# Patient Record
Sex: Male | Born: 1935 | Race: White | Hispanic: No | Marital: Single | Smoking: Former smoker
Health system: Southern US, Community
[De-identification: ages and names within clinical notes are randomized; demographics above are authoritative.]

## PROBLEM LIST (undated history)

## (undated) DIAGNOSIS — Z950 Presence of cardiac pacemaker: Secondary | ICD-10-CM

## (undated) DIAGNOSIS — F32A Depression, unspecified: Secondary | ICD-10-CM

## (undated) DIAGNOSIS — M1990 Unspecified osteoarthritis, unspecified site: Secondary | ICD-10-CM

## (undated) DIAGNOSIS — E785 Hyperlipidemia, unspecified: Secondary | ICD-10-CM

## (undated) DIAGNOSIS — Z8679 Personal history of other diseases of the circulatory system: Secondary | ICD-10-CM

## (undated) DIAGNOSIS — K279 Peptic ulcer, site unspecified, unspecified as acute or chronic, without hemorrhage or perforation: Secondary | ICD-10-CM

## (undated) DIAGNOSIS — F329 Major depressive disorder, single episode, unspecified: Secondary | ICD-10-CM

## (undated) DIAGNOSIS — R079 Chest pain, unspecified: Secondary | ICD-10-CM

## (undated) DIAGNOSIS — I5189 Other ill-defined heart diseases: Secondary | ICD-10-CM

## (undated) DIAGNOSIS — K859 Acute pancreatitis without necrosis or infection, unspecified: Secondary | ICD-10-CM

## (undated) DIAGNOSIS — I10 Essential (primary) hypertension: Secondary | ICD-10-CM

## (undated) DIAGNOSIS — K219 Gastro-esophageal reflux disease without esophagitis: Secondary | ICD-10-CM

## (undated) DIAGNOSIS — Z8719 Personal history of other diseases of the digestive system: Secondary | ICD-10-CM

## (undated) DIAGNOSIS — N200 Calculus of kidney: Secondary | ICD-10-CM

## (undated) DIAGNOSIS — I509 Heart failure, unspecified: Secondary | ICD-10-CM

## (undated) DIAGNOSIS — H3530 Unspecified macular degeneration: Secondary | ICD-10-CM

## (undated) DIAGNOSIS — N400 Enlarged prostate without lower urinary tract symptoms: Secondary | ICD-10-CM

## (undated) HISTORY — DX: Essential (primary) hypertension: I10

## (undated) HISTORY — DX: Heart failure, unspecified: I50.9

## (undated) HISTORY — PX: TONSILLECTOMY: SUR1361

## (undated) HISTORY — DX: Hyperlipidemia, unspecified: E78.5

## (undated) HISTORY — DX: Personal history of other diseases of the circulatory system: Z86.79

## (undated) HISTORY — DX: Other ill-defined heart diseases: I51.89

## (undated) HISTORY — DX: Presence of cardiac pacemaker: Z95.0

## (undated) HISTORY — DX: Gastro-esophageal reflux disease without esophagitis: K21.9

## (undated) HISTORY — DX: Peptic ulcer, site unspecified, unspecified as acute or chronic, without hemorrhage or perforation: K27.9

## (undated) HISTORY — DX: Calculus of kidney: N20.0

## (undated) HISTORY — PX: TOTAL KNEE ARTHROPLASTY: SHX125

## (undated) HISTORY — DX: Personal history of other diseases of the digestive system: Z87.19

## (undated) HISTORY — PX: CHOLECYSTECTOMY: SHX55

## (undated) HISTORY — DX: Unspecified macular degeneration: H35.30

## (undated) HISTORY — DX: Benign prostatic hyperplasia without lower urinary tract symptoms: N40.0

## (undated) HISTORY — DX: Chest pain, unspecified: R07.9

## (undated) HISTORY — PX: HERNIA REPAIR: SHX51

## (undated) HISTORY — PX: PACEMAKER INSERTION: SHX728

## (undated) HISTORY — DX: Unspecified osteoarthritis, unspecified site: M19.90

---

## 1998-12-05 ENCOUNTER — Ambulatory Visit (HOSPITAL_COMMUNITY): Admission: RE | Admit: 1998-12-05 | Discharge: 1998-12-05 | Payer: Self-pay | Admitting: *Deleted

## 1999-01-31 ENCOUNTER — Encounter: Payer: Self-pay | Admitting: Orthopedic Surgery

## 1999-02-07 ENCOUNTER — Inpatient Hospital Stay (HOSPITAL_COMMUNITY): Admission: RE | Admit: 1999-02-07 | Discharge: 1999-02-14 | Payer: Self-pay | Admitting: Orthopedic Surgery

## 1999-02-11 ENCOUNTER — Encounter: Payer: Self-pay | Admitting: Orthopedic Surgery

## 1999-02-15 ENCOUNTER — Encounter: Admission: RE | Admit: 1999-02-15 | Discharge: 1999-05-16 | Payer: Self-pay | Admitting: Orthopedic Surgery

## 2000-05-04 ENCOUNTER — Observation Stay (HOSPITAL_COMMUNITY): Admission: EM | Admit: 2000-05-04 | Discharge: 2000-05-05 | Payer: Self-pay | Admitting: *Deleted

## 2000-05-04 ENCOUNTER — Encounter: Payer: Self-pay | Admitting: *Deleted

## 2000-05-18 ENCOUNTER — Inpatient Hospital Stay (HOSPITAL_COMMUNITY): Admission: EM | Admit: 2000-05-18 | Discharge: 2000-05-19 | Payer: Self-pay | Admitting: Emergency Medicine

## 2000-05-26 ENCOUNTER — Encounter: Payer: Self-pay | Admitting: Emergency Medicine

## 2000-05-26 ENCOUNTER — Emergency Department (HOSPITAL_COMMUNITY): Admission: EM | Admit: 2000-05-26 | Discharge: 2000-05-26 | Payer: Self-pay | Admitting: Emergency Medicine

## 2000-09-05 ENCOUNTER — Ambulatory Visit (HOSPITAL_BASED_OUTPATIENT_CLINIC_OR_DEPARTMENT_OTHER): Admission: RE | Admit: 2000-09-05 | Discharge: 2000-09-06 | Payer: Self-pay | Admitting: *Deleted

## 2001-05-04 ENCOUNTER — Encounter: Payer: Self-pay | Admitting: Orthopedic Surgery

## 2001-05-04 ENCOUNTER — Ambulatory Visit (HOSPITAL_COMMUNITY): Admission: RE | Admit: 2001-05-04 | Discharge: 2001-05-04 | Payer: Self-pay | Admitting: Orthopedic Surgery

## 2001-11-04 ENCOUNTER — Ambulatory Visit (HOSPITAL_COMMUNITY): Admission: RE | Admit: 2001-11-04 | Discharge: 2001-11-04 | Payer: Self-pay | Admitting: Ophthalmology

## 2001-12-24 ENCOUNTER — Inpatient Hospital Stay (HOSPITAL_COMMUNITY): Admission: RE | Admit: 2001-12-24 | Discharge: 2001-12-26 | Payer: Self-pay | Admitting: Urology

## 2001-12-24 ENCOUNTER — Encounter (INDEPENDENT_AMBULATORY_CARE_PROVIDER_SITE_OTHER): Payer: Self-pay | Admitting: Specialist

## 2003-04-17 ENCOUNTER — Encounter: Payer: Self-pay | Admitting: Emergency Medicine

## 2003-04-17 ENCOUNTER — Emergency Department (HOSPITAL_COMMUNITY): Admission: EM | Admit: 2003-04-17 | Discharge: 2003-04-17 | Payer: Self-pay | Admitting: Emergency Medicine

## 2003-09-13 ENCOUNTER — Encounter: Admission: RE | Admit: 2003-09-13 | Discharge: 2003-09-29 | Payer: Self-pay | Admitting: Orthopedic Surgery

## 2003-10-12 ENCOUNTER — Ambulatory Visit (HOSPITAL_COMMUNITY): Admission: RE | Admit: 2003-10-12 | Discharge: 2003-10-12 | Payer: Self-pay | Admitting: *Deleted

## 2003-10-16 ENCOUNTER — Emergency Department (HOSPITAL_COMMUNITY): Admission: EM | Admit: 2003-10-16 | Discharge: 2003-10-16 | Payer: Self-pay | Admitting: Emergency Medicine

## 2003-10-26 ENCOUNTER — Ambulatory Visit (HOSPITAL_COMMUNITY): Admission: RE | Admit: 2003-10-26 | Discharge: 2003-10-26 | Payer: Self-pay | Admitting: Internal Medicine

## 2004-03-20 ENCOUNTER — Ambulatory Visit (HOSPITAL_COMMUNITY): Admission: RE | Admit: 2004-03-20 | Discharge: 2004-03-20 | Payer: Self-pay | Admitting: Gastroenterology

## 2004-03-20 ENCOUNTER — Encounter (INDEPENDENT_AMBULATORY_CARE_PROVIDER_SITE_OTHER): Payer: Self-pay | Admitting: *Deleted

## 2004-10-04 ENCOUNTER — Ambulatory Visit: Payer: Self-pay | Admitting: Cardiology

## 2004-10-16 ENCOUNTER — Ambulatory Visit: Payer: Self-pay | Admitting: Cardiology

## 2004-11-05 ENCOUNTER — Ambulatory Visit (HOSPITAL_COMMUNITY): Admission: RE | Admit: 2004-11-05 | Discharge: 2004-11-05 | Payer: Self-pay | Admitting: Internal Medicine

## 2005-04-17 ENCOUNTER — Ambulatory Visit: Payer: Self-pay | Admitting: Cardiology

## 2005-11-09 ENCOUNTER — Ambulatory Visit: Payer: Self-pay | Admitting: Cardiology

## 2005-11-09 ENCOUNTER — Inpatient Hospital Stay (HOSPITAL_COMMUNITY): Admission: EM | Admit: 2005-11-09 | Discharge: 2005-11-12 | Payer: Self-pay | Admitting: Emergency Medicine

## 2005-11-11 ENCOUNTER — Encounter: Payer: Self-pay | Admitting: Cardiology

## 2005-11-13 ENCOUNTER — Ambulatory Visit: Payer: Self-pay | Admitting: Internal Medicine

## 2005-11-26 ENCOUNTER — Ambulatory Visit: Payer: Self-pay | Admitting: Cardiology

## 2005-12-04 ENCOUNTER — Ambulatory Visit: Payer: Self-pay | Admitting: Internal Medicine

## 2005-12-19 ENCOUNTER — Ambulatory Visit: Payer: Self-pay | Admitting: Internal Medicine

## 2005-12-27 ENCOUNTER — Ambulatory Visit: Payer: Self-pay | Admitting: Internal Medicine

## 2005-12-27 ENCOUNTER — Ambulatory Visit (HOSPITAL_COMMUNITY): Admission: RE | Admit: 2005-12-27 | Discharge: 2005-12-27 | Payer: Self-pay | Admitting: Internal Medicine

## 2006-01-07 ENCOUNTER — Ambulatory Visit: Payer: Self-pay | Admitting: Cardiology

## 2006-01-10 ENCOUNTER — Ambulatory Visit: Payer: Self-pay

## 2006-07-04 ENCOUNTER — Ambulatory Visit: Payer: Self-pay | Admitting: Cardiology

## 2006-07-10 ENCOUNTER — Ambulatory Visit: Payer: Self-pay

## 2007-01-13 ENCOUNTER — Ambulatory Visit: Payer: Self-pay

## 2007-01-22 ENCOUNTER — Emergency Department (HOSPITAL_COMMUNITY): Admission: EM | Admit: 2007-01-22 | Discharge: 2007-01-22 | Payer: Self-pay | Admitting: Emergency Medicine

## 2007-02-03 ENCOUNTER — Ambulatory Visit: Payer: Self-pay | Admitting: Cardiology

## 2007-02-03 LAB — CONVERTED CEMR LAB
Cholesterol: 161 mg/dL (ref 0–200)
HDL: 46.2 mg/dL (ref >39.0)
VLDL: 24 mg/dL (ref 0–40)

## 2007-02-05 ENCOUNTER — Ambulatory Visit: Payer: Self-pay

## 2007-06-09 ENCOUNTER — Ambulatory Visit: Payer: Self-pay | Admitting: Cardiology

## 2007-06-09 LAB — CONVERTED CEMR LAB
BUN: 24 mg/dL — ABNORMAL HIGH (ref 6–23)
Calcium: 9.5 mg/dL (ref 8.4–10.5)
Chloride: 98 meq/L (ref 96–112)
Creatinine, Ser: 1.5 mg/dL (ref 0.4–1.5)
Sodium: 139 meq/L (ref 135–145)

## 2007-07-01 ENCOUNTER — Ambulatory Visit: Payer: Self-pay

## 2007-07-01 ENCOUNTER — Ambulatory Visit: Payer: Self-pay | Admitting: Internal Medicine

## 2007-07-22 ENCOUNTER — Ambulatory Visit: Payer: Self-pay | Admitting: Cardiology

## 2007-08-16 ENCOUNTER — Inpatient Hospital Stay (HOSPITAL_COMMUNITY): Admission: EM | Admit: 2007-08-16 | Discharge: 2007-08-20 | Payer: Self-pay | Admitting: Emergency Medicine

## 2007-12-30 ENCOUNTER — Ambulatory Visit: Payer: Self-pay

## 2007-12-30 ENCOUNTER — Ambulatory Visit: Payer: Self-pay | Admitting: Cardiology

## 2008-02-09 ENCOUNTER — Ambulatory Visit: Payer: Self-pay | Admitting: Cardiology

## 2008-02-09 LAB — CONVERTED CEMR LAB
AST: 17 units/L (ref 0–37)
Alkaline Phosphatase: 55 units/L (ref 39–117)
Bilirubin, Direct: 0.1 mg/dL (ref 0.0–0.3)
CO2: 32 meq/L (ref 19–32)
Chloride: 104 meq/L (ref 96–112)
Creatinine, Ser: 1.4 mg/dL (ref 0.4–1.5)
GFR calc non Af Amer: 52 mL/min
LDL Cholesterol: 62 mg/dL (ref 0–99)
Potassium: 4 meq/L (ref 3.5–5.1)
Sodium: 143 meq/L (ref 135–145)
Total Bilirubin: 1.1 mg/dL (ref 0.3–1.2)
Total CHOL/HDL Ratio: 2.6
Triglycerides: 61 mg/dL (ref 0–149)

## 2008-03-29 ENCOUNTER — Ambulatory Visit: Payer: Self-pay | Admitting: Internal Medicine

## 2008-05-18 ENCOUNTER — Encounter: Admission: RE | Admit: 2008-05-18 | Discharge: 2008-05-18 | Payer: Self-pay | Admitting: Gastroenterology

## 2008-06-29 ENCOUNTER — Ambulatory Visit: Payer: Self-pay | Admitting: Internal Medicine

## 2008-07-07 ENCOUNTER — Ambulatory Visit: Payer: Self-pay

## 2008-09-28 ENCOUNTER — Ambulatory Visit: Payer: Self-pay | Admitting: Internal Medicine

## 2008-10-20 ENCOUNTER — Encounter (INDEPENDENT_AMBULATORY_CARE_PROVIDER_SITE_OTHER): Payer: Self-pay | Admitting: *Deleted

## 2009-01-06 ENCOUNTER — Encounter: Payer: Self-pay | Admitting: Internal Medicine

## 2009-01-11 ENCOUNTER — Ambulatory Visit: Payer: Self-pay

## 2009-01-11 ENCOUNTER — Encounter: Payer: Self-pay | Admitting: Cardiology

## 2009-01-11 DIAGNOSIS — I6529 Occlusion and stenosis of unspecified carotid artery: Secondary | ICD-10-CM | POA: Insufficient documentation

## 2009-01-12 ENCOUNTER — Telehealth: Payer: Self-pay | Admitting: Internal Medicine

## 2009-01-13 ENCOUNTER — Encounter: Payer: Self-pay | Admitting: Internal Medicine

## 2009-01-16 ENCOUNTER — Ambulatory Visit: Payer: Self-pay | Admitting: Internal Medicine

## 2009-01-18 ENCOUNTER — Encounter: Payer: Self-pay | Admitting: Internal Medicine

## 2009-04-11 ENCOUNTER — Ambulatory Visit: Payer: Self-pay | Admitting: Internal Medicine

## 2009-04-11 DIAGNOSIS — I5033 Acute on chronic diastolic (congestive) heart failure: Secondary | ICD-10-CM

## 2009-04-14 ENCOUNTER — Ambulatory Visit: Payer: Self-pay

## 2009-04-14 ENCOUNTER — Encounter: Payer: Self-pay | Admitting: Internal Medicine

## 2009-07-07 ENCOUNTER — Encounter: Payer: Self-pay | Admitting: Cardiovascular Disease

## 2009-07-07 ENCOUNTER — Encounter: Payer: Self-pay | Admitting: Cardiology

## 2009-07-07 ENCOUNTER — Ambulatory Visit: Payer: Self-pay

## 2009-07-14 ENCOUNTER — Encounter: Payer: Self-pay | Admitting: Internal Medicine

## 2009-07-21 ENCOUNTER — Ambulatory Visit: Payer: Self-pay | Admitting: Internal Medicine

## 2009-07-28 ENCOUNTER — Ambulatory Visit: Payer: Self-pay | Admitting: Cardiology

## 2009-07-28 DIAGNOSIS — I2510 Atherosclerotic heart disease of native coronary artery without angina pectoris: Secondary | ICD-10-CM | POA: Insufficient documentation

## 2009-07-28 DIAGNOSIS — E785 Hyperlipidemia, unspecified: Secondary | ICD-10-CM

## 2009-07-28 DIAGNOSIS — I10 Essential (primary) hypertension: Secondary | ICD-10-CM

## 2009-07-28 DIAGNOSIS — R0602 Shortness of breath: Secondary | ICD-10-CM

## 2009-08-04 ENCOUNTER — Ambulatory Visit: Payer: Self-pay | Admitting: Cardiology

## 2009-08-04 ENCOUNTER — Encounter: Payer: Self-pay | Admitting: Cardiology

## 2009-08-04 ENCOUNTER — Ambulatory Visit: Payer: Self-pay

## 2009-08-04 ENCOUNTER — Ambulatory Visit: Payer: Self-pay | Admitting: Cardiovascular Disease

## 2009-08-04 ENCOUNTER — Ambulatory Visit (HOSPITAL_COMMUNITY): Admission: RE | Admit: 2009-08-04 | Discharge: 2009-08-04 | Payer: Self-pay | Admitting: Cardiology

## 2009-08-07 LAB — CONVERTED CEMR LAB
Calcium: 8.8 mg/dL (ref 8.4–10.5)
Glucose, Bld: 80 mg/dL (ref 70–99)

## 2009-08-11 ENCOUNTER — Ambulatory Visit: Payer: Self-pay | Admitting: Cardiology

## 2009-08-11 DIAGNOSIS — N289 Disorder of kidney and ureter, unspecified: Secondary | ICD-10-CM | POA: Insufficient documentation

## 2009-08-14 LAB — CONVERTED CEMR LAB
BUN: 37 mg/dL — ABNORMAL HIGH (ref 6–23)
CO2: 34 meq/L — ABNORMAL HIGH (ref 19–32)
Chloride: 100 meq/L (ref 96–112)
Creatinine, Ser: 2 mg/dL — ABNORMAL HIGH (ref 0.4–1.5)
Glucose, Bld: 80 mg/dL (ref 70–99)
Potassium: 5.4 meq/L — ABNORMAL HIGH (ref 3.5–5.1)

## 2009-08-18 ENCOUNTER — Ambulatory Visit: Payer: Self-pay | Admitting: Cardiology

## 2009-08-18 DIAGNOSIS — E876 Hypokalemia: Secondary | ICD-10-CM | POA: Insufficient documentation

## 2009-08-21 LAB — CONVERTED CEMR LAB
BUN: 39 mg/dL — ABNORMAL HIGH (ref 6–23)
CO2: 33 meq/L — ABNORMAL HIGH (ref 19–32)
Chloride: 102 meq/L (ref 96–112)
Creatinine, Ser: 2.1 mg/dL — ABNORMAL HIGH (ref 0.4–1.5)
Glucose, Bld: 88 mg/dL (ref 70–99)
Sodium: 144 meq/L (ref 135–145)

## 2009-08-25 ENCOUNTER — Encounter: Payer: Self-pay | Admitting: Internal Medicine

## 2009-08-25 ENCOUNTER — Ambulatory Visit: Payer: Self-pay | Admitting: Cardiology

## 2009-08-25 LAB — CONVERTED CEMR LAB
BUN: 32 mg/dL — ABNORMAL HIGH (ref 6–23)
CO2: 36 meq/L — ABNORMAL HIGH (ref 19–32)
Chloride: 104 meq/L (ref 96–112)
Glucose, Bld: 80 mg/dL (ref 70–99)
Potassium: 5 meq/L (ref 3.5–5.1)
Sodium: 143 meq/L (ref 135–145)

## 2009-09-07 ENCOUNTER — Encounter: Payer: Self-pay | Admitting: Cardiovascular Disease

## 2009-09-27 ENCOUNTER — Ambulatory Visit: Payer: Self-pay | Admitting: Cardiology

## 2009-10-06 ENCOUNTER — Ambulatory Visit: Payer: Self-pay | Admitting: Internal Medicine

## 2009-10-09 ENCOUNTER — Encounter: Payer: Self-pay | Admitting: Cardiology

## 2009-10-25 ENCOUNTER — Ambulatory Visit: Payer: Self-pay | Admitting: Internal Medicine

## 2009-10-27 ENCOUNTER — Encounter: Payer: Self-pay | Admitting: Internal Medicine

## 2010-01-24 ENCOUNTER — Telehealth: Payer: Self-pay | Admitting: Cardiology

## 2010-01-24 ENCOUNTER — Encounter: Payer: Self-pay | Admitting: Cardiology

## 2010-01-25 ENCOUNTER — Ambulatory Visit: Payer: Self-pay

## 2010-01-25 ENCOUNTER — Encounter: Payer: Self-pay | Admitting: Cardiology

## 2010-01-25 ENCOUNTER — Ambulatory Visit: Payer: Self-pay | Admitting: Internal Medicine

## 2010-02-16 ENCOUNTER — Encounter: Payer: Self-pay | Admitting: Internal Medicine

## 2010-03-06 ENCOUNTER — Ambulatory Visit: Payer: Self-pay | Admitting: Cardiology

## 2010-04-24 ENCOUNTER — Ambulatory Visit: Payer: Self-pay | Admitting: Internal Medicine

## 2010-07-22 ENCOUNTER — Encounter: Payer: Self-pay | Admitting: Gastroenterology

## 2010-07-25 ENCOUNTER — Encounter: Payer: Self-pay | Admitting: Cardiology

## 2010-07-26 ENCOUNTER — Encounter: Payer: Self-pay | Admitting: Internal Medicine

## 2010-07-26 ENCOUNTER — Ambulatory Visit: Admission: RE | Admit: 2010-07-26 | Discharge: 2010-07-26 | Payer: Self-pay | Source: Home / Self Care

## 2010-07-26 ENCOUNTER — Ambulatory Visit
Admission: RE | Admit: 2010-07-26 | Discharge: 2010-07-26 | Payer: Self-pay | Source: Home / Self Care | Attending: Internal Medicine | Admitting: Internal Medicine

## 2010-07-26 ENCOUNTER — Encounter: Payer: Self-pay | Admitting: Cardiology

## 2010-07-31 NOTE — Cardiovascular Report (Addendum)
Summary: Office Visit Remote   Office Visit Remote   Imported By: Roderic Ovens 08/25/2009 13:53:18  _____________________________________________________________________  External Attachment:    Type:   Image     Comment:   External Document

## 2010-07-31 NOTE — Miscellaneous (Addendum)
Summary: Orders Update  Clinical Lists Changes  Orders: Added new Test order of Carotid Duplex (Carotid Duplex) - Signed 

## 2010-07-31 NOTE — Letter (Addendum)
Summary: Device-Delinquent Phone Journalist, newspaper, Main Office  [address]   [place], [place] [address]   Phone: [phone]  Fax: [phone]     July 14, 2009 MRN: [id number]   [name] [name] [address] [place], [place]  [address]   Dear Mr. Folker,  According to our records, you were scheduled for a device phone transmission on July 12, 2009.     We did not receive any results from this check.  If you transmitted on your scheduled day, please call us to help troubleshoot your system.  If you forgot to send your transmission, please send one upon receipt of this letter.  Thank you,   [facility]

## 2010-07-31 NOTE — Cardiovascular Report (Addendum)
Summary: Office Visit Remote   Office Visit Remote   Imported By: Roderic Ovens 02/21/2010 14:57:37  _____________________________________________________________________  External Attachment:    Type:   Image     Comment:   External Document

## 2010-07-31 NOTE — Miscellaneous (Addendum)
Summary: Orders Update pft charges  Clinical Lists Changes  Orders: Added new Service order of Carbon Monoxide diffusing w/capacity (94720) - Signed Added new Service order of Lung Volumes (94240) - Signed Added new Service order of Spirometry (Pre & Post) (94060) - Signed 

## 2010-07-31 NOTE — Letter (Addendum)
Summary: [facility] Office Note  [facility] Office Note   Imported By: Roderic Ovens 09/29/2009 10:30:50  _____________________________________________________________________  External Attachment:    Type:   Image     Comment:   External Document

## 2010-07-31 NOTE — Progress Notes (Addendum)
Summary: pt needs refill   Phone Note Refill Request Call back at [phone] Message from:  Patient on [facility]/phone# [phone]    Refills Requested: Medication #1:  METOPROLOL TARTRATE 25 MG TABS Take one tablet by mouth twice a day Initial call taken by: Omer Jack,  January 24, 2010 10:57 AM    Prescriptions: METOPROLOL TARTRATE 25 MG TABS (METOPROLOL TARTRATE) Take one tablet by mouth twice a day  #60 x 12   Entered by:   Kem Parkinson   Authorized by:   Ferman Hamming, MD, [facility]   Signed by:   Kem Parkinson on 01/24/2010   Method used:   Faxed to ...       [facility] (retail)       [address]       [place], [place]  [address]       Ph: [phone] or [phone]       Fax: [phone]   RxID:   [id number]

## 2010-07-31 NOTE — Cardiovascular Report (Addendum)
Summary: Office Visit Remote  Office Visit Remote   Imported By: Roderic Ovens 11/03/2009 14:23:00  _____________________________________________________________________  External Attachment:    Type:   Image     Comment:   External Document

## 2010-07-31 NOTE — Assessment & Plan Note (Addendum)
Summary: per check out/sf  Medications Added PANTOPRAZOLE SODIUM 40 MG TBEC (PANTOPRAZOLE SODIUM) 1 tab by mouth two times a day FUROSEMIDE 40 MG TABS (FUROSEMIDE) Take one tablet by mouth daily. KLOR-CON M20 20 MEQ CR-TABS (POTASSIUM CHLORIDE CRYS CR) 2 tabs by mouth once daily VITAMIN B-12 100 MCG TABS (CYANOCOBALAMIN) 1 tab by mouth once daily ASPIRIN 81 MG  TABS (ASPIRIN) 1 tab by mouth once daily        CC:  sob.  History of Present Illness: [name] [name] is a very pleasant gentleman who has a history of congestive heart failure, felt secondary to diastolic dysfunction; hypertension; hyperlipidemia; mild coronary artery disease; and cerebrovascular disease.  He also has had a previous pacemaker placed in December 2008 following a cardiac arrest at [facility].  This was placed at [facility] and apparently was bradycardia mediated.  His most recent cardiac catheterization here was in April 2005.  At that time, he had nonobstructive coronary artery disease, but he did have elevated right and left filling pressures. Last Myoview was performed on February 05, 2007. Ejection fraction was 65% and the perfusion was normal. His most recent echocardiogram here in May 2007 showed normal LV function, mild aortic insufficiency, and mild mitral regurgitation.  He did have dilated right ventricle.  Carotid Dopplers in January of 2011 showed 60-79% left and 0-39% right stenosis. Followup is recommended in one year. I last saw him in July of 2009. Since then he has noticed increasing dyspnea on exertion over the past 3 months. It is relieved with rest. It is not associated with chest pain. There is no orthopnea or PND and he states his pedal edema is reasonably controlled with his Lasix. He otherwise does not have chest pain and is no syncope.  Current Medications (verified): 1)  Metoprolol Tartrate 25 Mg Tabs (Metoprolol Tartrate) .... Take One Tablet By Mouth Twice A Day 2)  Simvastatin 40 Mg Tabs  (Simvastatin) .... Take One Tablet By Mouth Daily At Bedtime 3)  Finasteride 5 Mg Tabs (Finasteride) .... Take One Tablet Once Daily 4)  Flunisolide 0.025 % Soln (Flunisolide) .... Use 2 Sprays in Each Nostril Two Times A Day 5)  Lisinopril 40 Mg Tabs (Lisinopril) .... Take One Half Tablet Once Daily 6)  Ocuvite  Tabs (Multiple Vitamins-Minerals) .... 2 Tabs Two Times A Day 7)  Salsalate 500 Mg Tabs (Salsalate) .... Take One Tablet By Mouth Two Times A Day 8)  Citalopram Hydrobromide 20 Mg Tabs (Citalopram Hydrobromide) .... Take One Tablet Once Daily 9)  Pantoprazole Sodium 40 Mg Tbec (Pantoprazole Sodium) .Marland Kitchen.. 1 Tab By Mouth Two Times A Day 10)  Allopurinol 300 Mg Tabs (Allopurinol) .... Take One Tablet Once Daily 11)  Furosemide 20 Mg Tabs (Furosemide) .... Once Daily 12)  Polyethylene Glycol 3350  Liqd (Polyethylene Glycol 3350) .... Take Once Daily 13)  Klor-Con M20 20 Meq Cr-Tabs (Potassium Chloride Crys Cr) .... 2 Tabs By Mouth Once Daily 14)  Vitamin B-12 100 Mcg Tabs (Cyanocobalamin) .Marland Kitchen.. 1 Tab By Mouth Once Daily 15)  Aspirin 81 Mg  Tabs (Aspirin) .Marland Kitchen.. 1 Tab By Mouth Once Daily  Allergies: No Known Drug Allergies  Past History:  Past Medical History: Osteoarthritis Gout Hypertension History of coronary disease GERD Cardiomyopathy with congestive heart failure and diastolic dysfunction History of GI bleed History of pacemaker-Medtronic Adapta ADDro1 Kidney stone Peptic ulcer disease BPH Macular degeneration of the right eye, near blind Hyperlipidemia History of cerebrovascular disease     Past Surgical  History: Pacemaker-implantation Medtronic Adapta ADDro1 History of cholecystectomy History of knee replacement  Social History: Reviewed history from 04/10/2009 and no changes required. No alcohol.  He is married for 20 years.  He has 2   children and 3 grandchildren.  No tobacco use.  He is retired.   Review of Systems       Patient complains of increased  thirst but no fevers or chills, productive cough, hemoptysis, dysphasia, odynophagia, melena, hematochezia, dysuria, hematuria, rash, seizure activity, orthopnea, PND,  claudication. Remaining systems are negative.   Vital Signs:  Patient profile:   77 year old male Height:      68 inches Weight:      198 pounds BMI:     30.21 Pulse rate:   91 / minute Resp:     12 per minute BP sitting:   109 / 71  (left arm)  Vitals Entered By: Kem Parkinson (July 28, 2009 9:40 AM)  Physical Exam  General:  Well-developed well-nourished in no acute distress.  Skin is warm and dry.  HEENT is normal.  Neck is supple. No thyromegaly.  Chest diminished breath sounds throughout but otherwise clear. Cardiovascular exam is regular rate and rhythm.  Abdominal exam nontender or distended. No masses palpated. Extremities show trace edema. neuro grossly intact    EKG  Procedure date:  07/28/2009  Findings:      atrial paced rhythm with first degree AV block. IVCD. Cannot rule out prior inferior infarct.  PPM Specifications Following MD:  Sherryl Manges, MD     PPM Vendor:  Medtronic     PPM Model Number:  ADDR01     PPM Serial Number:  [id number] PPM DOI:  04/07/2007      Lead 1    Location: RA     DOI: 04/07/2007     Model #: 5076     Serial #: [id number]     Status: active Lead 2    Location: RV     DOI: 04/07/2007     Model #: 6578     Serial #: [id number]     Status: active   Indications:  SYNCOPE   PPM Follow Up Pacer Dependent:  No      Episodes Coumadin:  No  Parameters Mode:  DDDR+     Lower Rate Limit:  60     Upper Rate Limit:  130 Paced AV Delay:  200     Sensed AV Delay:  180  Impression & Recommendations:  Problem # 1:  DYSPNEA (ICD-786.05) This may be related to diastolic dysfunction. I will increase his Lasix to 40 mg p.o. daily. I will check a bmet and BNP in one week. I will schedule an echocardiogram to reassess LV function. His updated medication list for this  problem includes:    Metoprolol Tartrate 25 Mg Tabs (Metoprolol tartrate) .Marland Kitchen... Take one tablet by mouth twice a day    Lisinopril 40 Mg Tabs (Lisinopril) .Marland Kitchen... Take one half tablet once daily    Salsalate 500 Mg Tabs (Salsalate) .Marland Kitchen... Take one tablet by mouth two times a day    Furosemide 20 Mg Tabs (Furosemide) ..... Once daily    Aspirin 81 Mg Tabs (Aspirin) .Marland Kitchen... 1 tab by mouth once daily  Orders: Echocardiogram (Echo)  Problem # 2:  DIASTOLIC HEART FAILURE, CHRONIC (ICD-428.32)  Management as per #1. His updated medication list for this problem includes:    Metoprolol Tartrate 25 Mg Tabs (Metoprolol tartrate) .Marland Kitchen... Take one tablet by  mouth twice a day    Lisinopril 40 Mg Tabs (Lisinopril) .Marland Kitchen... Take one half tablet once daily    Salsalate 500 Mg Tabs (Salsalate) .Marland Kitchen... Take one tablet by mouth two times a day    Furosemide 40 Mg Tabs (Furosemide) .Marland Kitchen... Take one tablet by mouth daily.    Aspirin 81 Mg Tabs (Aspirin) .Marland Kitchen... 1 tab by mouth once daily  His updated medication list for this problem includes:    Metoprolol Tartrate 25 Mg Tabs (Metoprolol tartrate) .Marland Kitchen... Take one tablet by mouth twice a day    Lisinopril 40 Mg Tabs (Lisinopril) .Marland Kitchen... Take one half tablet once daily    Salsalate 500 Mg Tabs (Salsalate) .Marland Kitchen... Take one tablet by mouth two times a day    Furosemide 40 Mg Tabs (Furosemide) .Marland Kitchen... Take one tablet by mouth daily.    Aspirin 81 Mg Tabs (Aspirin) .Marland Kitchen... 1 tab by mouth once daily  Problem # 3:  CARDIAC PACEMAKER MEDTRONIC ADAPTA ADDRO1 (ICD-V45.01) Management per EP.  Problem # 4:  CAROTID OCCLUSIVE DISEASE (ICD-433.10) Continue aspirin and statin. Followup carotid Dopplers January of 2012. His updated medication list for this problem includes:    Salsalate 500 Mg Tabs (Salsalate) .Marland Kitchen... Take one tablet by mouth two times a day    Aspirin 81 Mg Tabs (Aspirin) .Marland Kitchen... 1 tab by mouth once daily  His updated medication list for this problem includes:    Salsalate 500  Mg Tabs (Salsalate) .Marland Kitchen... Take one tablet by mouth two times a day    Aspirin 81 Mg Tabs (Aspirin) .Marland Kitchen... 1 tab by mouth once daily  Problem # 5:  ESSENTIAL HYPERTENSION, BENIGN (ICD-401.1) Blood pressure controlled on present medications. Will continue. His updated medication list for this problem includes:    Metoprolol Tartrate 25 Mg Tabs (Metoprolol tartrate) .Marland Kitchen... Take one tablet by mouth twice a day    Lisinopril 40 Mg Tabs (Lisinopril) .Marland Kitchen... Take one half tablet once daily    Salsalate 500 Mg Tabs (Salsalate) .Marland Kitchen... Take one tablet by mouth two times a day    Furosemide 40 Mg Tabs (Furosemide) .Marland Kitchen... Take one tablet by mouth daily.    Aspirin 81 Mg Tabs (Aspirin) .Marland Kitchen... 1 tab by mouth once daily  Problem # 6:  HYPERLIPIDEMIA (ICD-272.4) Continue statin. Lipids and liver monitored by his primary care. His updated medication list for this problem includes:    Simvastatin 40 Mg Tabs (Simvastatin) .Marland Kitchen... Take one tablet by mouth daily at bedtime  Problem # 7:  CAD (ICD-414.00) Continue aspirin, beta blocker ACE inhibitor and statin. His updated medication list for this problem includes:    Metoprolol Tartrate 25 Mg Tabs (Metoprolol tartrate) .Marland Kitchen... Take one tablet by mouth twice a day    Lisinopril 40 Mg Tabs (Lisinopril) .Marland Kitchen... Take one half tablet once daily    Salsalate 500 Mg Tabs (Salsalate) .Marland Kitchen... Take one tablet by mouth two times a day    Aspirin 81 Mg Tabs (Aspirin) .Marland Kitchen... 1 tab by mouth once daily  Patient Instructions: 1)  Your physician recommends that you schedule a follow-up appointment in: 8 WEEKS 2)  Your physician recommends that you return for lab work in:ONE WEEK-BMP/BNP-428.33/V58.69 3)  Your physician has recommended you make the following change in your medication: INCREASE FUROSEMIDE TO 40MG  ONCE DAILY 4)  Your physician has requested that you have an echocardiogram.  Echocardiography is a painless test that uses sound waves to create images of your heart. It provides  your doctor with information about the  size and shape of your heart and how well your heart's chambers and valves are working.  This procedure takes approximately one hour. There are no restrictions for this procedure. Prescriptions: FUROSEMIDE 40 MG TABS (FUROSEMIDE) Take one tablet by mouth daily.  #30 x 12   Entered by:   Deliah Goody, RN   Authorized by:   Ferman Hamming, MD, [facility]   Signed by:   Deliah Goody, RN on 07/28/2009   Method used:   Electronically to        [facility] [phone]* (retail)       [address] [place], [place]  [address]       Ph: [phone] or [phone]       Fax: [phone]   RxID:   [id number]   Appended Document: per check out/sf Patient states that his blood pressure runs low at times. Given that we are increasing his Lasix I will decrease his lisinopril to 20 mg p.o. daily.  Appended Document: per check out/sf pt states in the past bp running low on increased lasix, per dr Jens Som pt to decrease lisinopril to 10mg  once daily Deliah Goody, RN  July 28, 2009 10:06 AM    Clinical Lists Changes  Medications: Changed medication from LISINOPRIL 40 MG TABS (LISINOPRIL) take one half tablet once daily to LISINOPRIL 10 MG TABS (LISINOPRIL) Take one tablet by mouth daily - Signed Rx of LISINOPRIL 10 MG TABS (LISINOPRIL) Take one tablet by mouth daily;  #30 x 12;  Signed;  Entered by: Deliah Goody, RN;  Authorized by: Ferman Hamming, MD, [facility];  Method used: Print then Give to Patient Rx of LISINOPRIL 10 MG TABS (LISINOPRIL) Take one tablet by mouth daily;  #30 x 12;  Signed;  Entered by: Deliah Goody, RN;  Authorized by: Ferman Hamming, MD, [facility];  Method used: Electronically to [facility] [phone]*, [address], [place], [place], [place]  [address], Ph: [phone] or [phone], Fax: [phone]    Prescriptions: LISINOPRIL 10 MG TABS (LISINOPRIL) Take one tablet by mouth  daily  #30 x 12   Entered by:   Deliah Goody, RN   Authorized by:   Ferman Hamming, MD, [facility]   Signed by:   Deliah Goody, RN on 07/28/2009   Method used:   Electronically to        [facility] [phone]* (retail)       [address] [place], [place]  [address]       Ph: [phone] or [phone]       Fax: [phone]   RxID:   [id number] LISINOPRIL 10 MG TABS (LISINOPRIL) Take one tablet by mouth daily  #30 x 12   Entered by:   Deliah Goody, RN   Authorized by:   Ferman Hamming, MD, [facility]   Signed by:   Deliah Goody, RN on 07/28/2009   Method used:   Print then Give to Patient   RxID:   [id number]

## 2010-07-31 NOTE — Assessment & Plan Note (Addendum)
Summary: 6 month rov/sl      Allergies Added: NKDA  Visit Type:  6  months follow up Primary [name] [name]:  [name] [name]  CC:  No complains.  History of Present Illness: [name] [name] is a very pleasant gentleman who has a history of congestive heart failure, felt secondary to diastolic dysfunction; hypertension; hyperlipidemia; mild coronary artery disease; and cerebrovascular disease.  He also has had a previous pacemaker placed in December 2008 following a cardiac arrest at [facility].  This was placed at [facility] and apparently was bradycardia mediated.  His most recent cardiac catheterization here was in April 2005.  At that time, he had nonobstructive coronary artery disease, but he did have elevated right and left filling pressures. Last Myoview was performed on February 05, 2007. Ejection fraction was 65% and the perfusion was normal. His most recent echocardiogram  was performed in February of 2011 and showed normal LV function, mild aortic stenosis with a mean gradient of 9 mmHg, mild mitral and tricuspid regurgitation and mild biatrial enlargement.  Carotid Dopplers in July of 2011 showed 60-79% left and 0-39% right stenosis. Followup is recommended in six months. I last saw him in March of 2011. Since then, the patient has dyspnea with more extreme activities but not with routine activities. It is relieved with rest. It is not associated with chest pain. There is no orthopnea, PND. There is no syncope or palpitations. There is no exertional chest pain. He does have mild pedal edema.   Current Medications (verified): 1)  Metoprolol Tartrate 25 Mg Tabs (Metoprolol Tartrate) .... Take One Tablet By Mouth Twice A Day 2)  Simvastatin 40 Mg Tabs (Simvastatin) .... Take One Tablet By Mouth Daily At Bedtime 3)  Finasteride 5 Mg Tabs (Finasteride) .... Take One Tablet Once Daily 4)  Flunisolide 0.025 % Soln (Flunisolide) .... Use 2 Sprays in Each Nostril Two Times A Day 5)  Ocuvite   Tabs (Multiple Vitamins-Minerals) .... 2 Tabs Two Times A Day 6)  Citalopram Hydrobromide 20 Mg Tabs (Citalopram Hydrobromide) .... Take One Tablet Once Daily 7)  Pantoprazole Sodium 40 Mg Tbec (Pantoprazole Sodium) .Marland Kitchen.. 1 Tab By Mouth Two Times A Day 8)  Allopurinol 300 Mg Tabs (Allopurinol) .... Take One Tablet Once Daily 9)  Furosemide 40 Mg Tabs (Furosemide) .... Take One Tablet Alt With One Half Tablet Everyother Day 10)  Polyethylene Glycol 3350  Liqd (Polyethylene Glycol 3350) .... Take Once Daily 11)  Vitamin B-12 100 Mcg Tabs (Cyanocobalamin) .Marland Kitchen.. 1 Tab By Mouth Once Daily 12)  Aspirin 81 Mg  Tabs (Aspirin) .Marland Kitchen.. 1 Tab By Mouth Once Daily  Allergies (verified): No Known Drug Allergies  Past History:  Past Medical History: Reviewed history from 09/27/2009 and no changes required. 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 Pacemaker-implantation Medtronic Adapta ADDro1 History of cholecystectomy History of knee replacement  Social History: Reviewed history from 04/10/2009 and no changes required. No alcohol.  He is married for 20 years.  He has 2   children and 3 grandchildren.  No tobacco use.  He is retired.   Review of Systems       no fevers or chills, productive cough, hemoptysis, dysphasia, odynophagia, melena, hematochezia,  dysuria, hematuria, rash, seizure activity, orthopnea, PND, claudication. Remaining systems are negative.   Vital Signs:  Patient profile:   77 year old male Height:      68 inches Weight:      196 pounds BMI:     29.91 Pulse rate:   80 / minute Pulse rhythm:    regular Resp:     18 per minute BP sitting:   104 / 70  (left arm) Cuff size:   large  Vitals Entered By: [name] [name] (March 06, 2010 1:37 PM)  Physical Exam  General:  Well-developed well-nourished in no acute distress.  Skin is warm and dry.  HEENT is normal.  Neck is supple. No thyromegaly.  Chest is clear to auscultation with normal expansion.  Cardiovascular exam is regular rate and rhythm.  Abdominal exam nontender or distended. No masses palpated. Extremities show no edema. neuro grossly intact    EKG  Procedure date:  03/06/2010  Findings:      paced rhythm with first degree AV block. Right bundle branch block. Cannot rule out prior inferior infarct.  PPM Specifications Following MD:  Sherryl Manges, MD     PPM Vendor:  Medtronic     PPM Model Number:  ADDR01     PPM Serial Number:  [id number] PPM DOI:  04/07/2007      Lead 1    Location: RA     DOI: 04/07/2007     Model #: 5076     Serial #: [id number]     Status: active Lead 2    Location: RV     DOI: 04/07/2007     Model #: 8295     Serial #: [id number]     Status: active   Indications:  SYNCOPE   PPM Follow Up Pacer Dependent:  No      Episodes Coumadin:  No  Parameters Mode:  DDDR+     Lower Rate Limit:  60     Upper Rate Limit:  130 Paced AV Delay:  200     Sensed AV Delay:  180  Impression & Recommendations:  Problem # 1:  KIDNEY DISEASE (ICD-593.9) Monitored by nephrology.  Problem # 2:  ACUTE ON CHRONIC DIASTOLIC HEART FAILURE (ICD-428.33) Patient appears to be euvolemic on examination. Continue present dose of diuretics. His updated medication list for this problem includes:    Metoprolol Tartrate 25 Mg Tabs (Metoprolol tartrate) .Marland Kitchen... Take one tablet by mouth twice a day    Furosemide 40 Mg Tabs (Furosemide) .Marland Kitchen... Take one tablet alt with one half tablet everyother day    Aspirin 81 Mg Tabs (Aspirin) .Marland Kitchen... 1 tab by mouth once daily  Problem # 3:  CAD (ICD-414.00) Continue aspirin,  beta blocker and statin. His updated medication list for this problem includes:    Metoprolol Tartrate 25 Mg Tabs (Metoprolol tartrate) .Marland Kitchen... Take one tablet by mouth twice a day    Aspirin 81 Mg Tabs (Aspirin) .Marland Kitchen... 1 tab by mouth once daily  Problem # 4:  HYPERLIPIDEMIA (ICD-272.4) Continue statin. His updated medication list for this problem includes:    Simvastatin 40 Mg Tabs (Simvastatin) .Marland Kitchen... Take one tablet by mouth daily at bedtime  Problem # 5:  ESSENTIAL HYPERTENSION, BENIGN (ICD-401.1) Blood pressure controlled on present medications. Will continue. His updated medication list for this problem includes:    Metoprolol Tartrate 25 Mg Tabs (Metoprolol tartrate) .Marland Kitchen... Take one tablet by mouth twice a day    Furosemide 40 Mg Tabs (  Furosemide) .Marland Kitchen... Take one tablet alt with one half tablet everyother day    Aspirin 81 Mg Tabs (Aspirin) .Marland Kitchen... 1 tab by mouth once daily  Problem # 6:  CARDIAC PACEMAKER MEDTRONIC ADAPTA ADDRO1 (ICD-V45.01) Management per electrophysiology.  Problem # 7:  CAROTID OCCLUSIVE DISEASE (ICD-433.10) Continue aspirin and statin. Followup carotid Doppler in January 2012. His updated medication list for this problem includes:    Aspirin 81 Mg Tabs (Aspirin) .Marland Kitchen... 1 tab by mouth once daily  Other Orders: EKG w/ Interpretation (93000)  Patient Instructions: 1)  Your physician recommends that you schedule a follow-up appointment in: 6 months. The office will mail you a reminder letter 2 months prior appointment date.

## 2010-07-31 NOTE — Letter (Addendum)
Summary: Remote Device Check  [facility] [employer], Main Office  [address]   [place], [place] [address]   Phone: [phone]  Fax: [phone]     February 16, 2010 MRN: [id number]   MAICO MULVEHILL [address] [place], [place]  [address]   Dear Mr. Musich,   Your remote transmission was recieved and reviewed by your physician.  All diagnostics were within normal limits for you.   __X____Your next office visit is scheduled for:  November with Dr Graciela Husbands. Please call our office to schedule an appointment.    Sincerely,  Vella Kohler

## 2010-07-31 NOTE — Assessment & Plan Note (Addendum)
Summary: medtronic/saf   Visit Type:  PPM-Metronic Primary Provider:  Ivery Quale  CC:  no complaints.  History of Present Illness: [name] is seen in followup for pacemaker implanted at [facility] 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 Last Myoview was performed on February 05, 2007. Ejection fraction was 65% and the perfusion was normal. His most recent echocardiogram  was performed in February of 2011 and showed normal LV function, mild aortic stenosis with a mean gradient of 9 mmHg, mild mitral and tricuspid regurgitation and mild biatrial enlargement.     Problems Prior to Update: 1)  Hypopotassemia  (ICD-276.8) 2)  Kidney Disease  (ICD-593.9) 3)  Encounter For Long-term Use of Other Medications  (ICD-V58.69) 4)  Acute On Chronic Diastolic Heart Failure  (ICD-428.33) 5)  Cad  (ICD-414.00) 6)  Hyperlipidemia  (ICD-272.4) 7)  Essential Hypertension, Benign  (ICD-401.1) 8)  Dyspnea  (ICD-786.05) 9)  Edema-asymmetric  (ICD-782.3) 10)  Diastolic Heart Failure, Chronic  (ICD-428.32) 11)  Sinus Node Dysfunction  (ICD-427.81) 12)  Cardiac Pacemaker Medtronic Adapta Addro1  (ICD-V45.01) 13)  Carotid Occlusive Disease  (ICD-433.10)  Current Medications (verified): 1)  Metoprolol Tartrate 25 Mg Tabs (Metoprolol Tartrate) .... Take One Tablet By Mouth Twice A Day 2)  Simvastatin 40 Mg Tabs (Simvastatin) .... Take One Tablet By Mouth Daily At Bedtime 3)  Finasteride 5 Mg Tabs (Finasteride) .... Take One Tablet Once Daily 4)  Flunisolide 0.025 % Soln (Flunisolide) .... Use 2 Sprays in Each Nostril Two Times A Day 5)  Ocuvite  Tabs (Multiple Vitamins-Minerals) .... 2 Tabs Two Times A Day 6)  Citalopram Hydrobromide 20 Mg Tabs  (Citalopram Hydrobromide) .... Take One Tablet Once Daily 7)  Pantoprazole Sodium 40 Mg Tbec (Pantoprazole Sodium) .Marland Kitchen.. 1 Tab By Mouth Two Times A Day 8)  Allopurinol 300 Mg Tabs (Allopurinol) .... Take One Tablet Once Daily 9)  Furosemide 40 Mg Tabs (Furosemide) .... Take One Tablet Every Other Day, 1/2 Tablet The Rest 10)  Polyethylene Glycol 3350  Liqd (Polyethylene Glycol 3350) .... Take Once Daily 11)  Vitamin B-12 100 Mcg Tabs (Cyanocobalamin) .Marland Kitchen.. 1 Tab By Mouth Once Daily 12)  Aspirin 81 Mg  Tabs (Aspirin) .Marland Kitchen.. 1 Tab By Mouth Once Daily  Allergies (verified): No Known Drug Allergies  Past History:  Past Medical History: Last updated: 09/27/2009 Osteoarthritis Gout Hypertension History of coronary disease GERD congestive heart failure and diastolic dysfunction History of GI bleed History of pacemaker-Medtronic Adapta ADDro1 Kidney stone Peptic ulcer disease BPH Macular degeneration of the right eye, near blind Hyperlipidemia History of cerebrovascular disease     Past Surgical History: Last updated: 07/28/2009 Pacemaker-implantation Medtronic Adapta ADDro1 History of cholecystectomy History of knee replacement  Family History: Last updated: 18-Apr-2009 Father died at the age of 25 of leukemia.   Mother  died at the age of 78 of diabetes and old age   Social History: Last updated: 18-Apr-2009 No alcohol.  He is married for 20 years.  He has 2   children and 3 grandchildren.  No tobacco use.  He is retired.   Vital Signs:  Patient profile:   77 year old male Height:      68 inches Weight:      193 pounds BMI:  29.45 Pulse rate:   63 / minute BP sitting:   122 / 68  (left arm) Cuff size:   regular  Vitals Entered By: Caralee Ates CMA (April 24, 2010 11:51 AM)  Physical Exam  General:  The patient was alert and oriented in no acute distress. HEENT Normal.  Neck veins were flat, carotids were brisk.  Lungs were clear.  Heart sounds were regular  without murmurs or gallops.  Abdomen was soft with active bowel sounds. There is no clubbing cyanosis or edema. Skin Warm and dry    PPM Specifications Following MD:  Sherryl Manges, MD     PPM Vendor:  Medtronic     PPM Model Number:  ADDR01     PPM Serial Number:  [id number] PPM DOI:  04/07/2007      Lead 1    Location: [facility]     DOI: 04/07/2007     Model #: [id number]     Serial #: [id number]     Status: active Lead 2    Location: RV     DOI: 04/07/2007     Model #: [id number]     Serial #: [id number]     Status: active   Indications:  SYNCOPE   PPM Follow Up Remote Check?  No Battery Voltage:  2.77 V     Battery Est. Longevity:  6 years     Pacer Dependent:  Yes       PPM Device Measurements Atrium  Amplitude: 5.6 mV, Impedance: 565 ohms, Threshold: 0.875 V at 0.4 msec Right Ventricle  Amplitude: 16.0 mV, Impedance: 576 ohms, Threshold: 0.625 V at 0.4 msec  Episodes MS Episodes:  0     Percent Mode Switch:  0     Coumadin:  No Ventricular High Rate:  0     Atrial Pacing:  99.8%     Ventricular Pacing:  8.0%  Parameters Mode:  DDDR+     Lower Rate Limit:  60     Upper Rate Limit:  130 Paced AV Delay:  200     Sensed AV Delay:  180 Rate Response Parameters:  ADL response 3 Exertion response 2 Next Remote Date:  07/26/2010     Next Cardiology Appt Due:  04/01/2011 Tech Comments:  Rate response reprogrammed as above for right shift.  [facility] transmissions every 3 months.  ROV 1 year with Dr. Graciela Husbands. Altha Harm, LPN  April 24, 2010 11:53 AM   Impression & Recommendations:  Problem # 1:  CAD (ICD-414.00) stable on current meds His updated medication list for this problem includes:    Metoprolol Tartrate 25 Mg Tabs (Metoprolol tartrate) .Marland Kitchen... Take one tablet by mouth twice a day    Aspirin 81 Mg Tabs (Aspirin) .Marland Kitchen... 1 tab by mouth once daily  Problem # 2:  DYSPNEA (ICD-786.05) I suspect this represents diastolic heart failure. We have reprogrammed his device to decrease the exertional  rate. This may improve exercise performance with increasing diastolic filling time His updated medication list for this problem includes:    Metoprolol Tartrate 25 Mg Tabs (Metoprolol tartrate) .Marland Kitchen... Take one tablet by mouth twice a day    Furosemide 40 Mg Tabs (Furosemide) .Marland Kitchen... Take one tablet every other day, 1/2 tablet the rest    Aspirin 81 Mg Tabs (Aspirin) .Marland Kitchen... 1 tab by mouth once daily  Problem # 3:  SINUS NODE DYSFUNCTION (ICD-427.81) stable; see above His updated medication list for this problem includes:    Metoprolol Tartrate 25 Mg  Tabs (Metoprolol tartrate) .Marland Kitchen... Take one tablet by mouth twice a day    Aspirin 81 Mg Tabs (Aspirin) .Marland Kitchen... 1 tab by mouth once daily  Problem # 4:  CARDIAC PACEMAKER MEDTRONIC ADAPTA ADDRO1 (ICD-V45.01) Device parameters and data were reviewed and no changes were made  Patient Instructions: 1)  Your physician recommends that you schedule a follow-up appointment in: 12 months 2)  Your physician recommends that you continue on your current medications as directed. Please refer to the Current Medication list given to you today. 3)  Your physician wants you to follow-up in:   You will receive a reminder letter in the mail two months in advance. If you don't receive a letter, please call our office to schedule the follow-up appointment.

## 2010-07-31 NOTE — Letter (Addendum)
Summary: [facility]   Imported By: Kassie Mends 09/21/2009 13:23:43  _____________________________________________________________________  External Attachment:    Type:   Image     Comment:   External Document

## 2010-07-31 NOTE — Letter (Addendum)
Summary: Remote Device Check  [facility], Main Office  [address]   [place], [place] [address]   Phone: [phone]  Fax: [phone]     August 25, 2009 MRN: [id number]   [name] [name] [address] [place], [place]  [address]   Dear [name],   Your remote transmission was recieved and reviewed by your physician.  All diagnostics were within normal limits for you.  __X___Your next transmission is scheduled for:   October 25, 2009.  Please transmit at any time this day.  If you have a wireless device your transmission will be sent automatically.      Sincerely,  Proofreader

## 2010-07-31 NOTE — Letter (Addendum)
Summary: Remote Device Check  [facility], Main Office  [address]   [place], [place] [address]   Phone: [phone]  Fax: [phone]     October 27, 2009 MRN: [id number]   THERESA DOHRMAN [address] [place], [place]  [address]   Dear Mr. Intrieri,   Your remote transmission was recieved and reviewed by your physician.  All diagnostics were within normal limits for you.  ___X__Your next transmission is scheduled for:   January 25, 2010.  Please transmit at any time this day.  If you have a wireless device your transmission will be sent automatically.     Sincerely,  Proofreader

## 2010-07-31 NOTE — Assessment & Plan Note (Addendum)
Summary: PER CHECK OUT/SF  Medications Added FUROSEMIDE 40 MG TABS (FUROSEMIDE) Take one tablet alt with one half tablet everyother day      Allergies Added: NKDA  Visit Type:  2 mo f/u Primary Provider:  Ivery Quale  CC:  sob...edema/legs/feet/abdomen..denies any cp.  History of Present Illness: [name] [name] is a very pleasant gentleman who has a history of congestive heart failure, felt secondary to diastolic dysfunction; hypertension; hyperlipidemia; mild coronary artery disease; and cerebrovascular disease.  He also has had a previous pacemaker placed in December 2008 following a cardiac arrest at [facility].  This was placed at [facility] and apparently was bradycardia mediated.  His most recent cardiac catheterization here was in April 2005.  At that time, he had nonobstructive coronary artery disease, but he did have elevated right and left filling pressures. Last Myoview was performed on February 05, 2007. Ejection fraction was 65% and the perfusion was normal. His most recent echocardiogram  was performed in February of 2011 and showed normal LV function, mild aortic stenosis with a mean gradient of 9 mmHg, mild mitral and tricuspid regurgitation and mild biatrial enlargement.  Carotid Dopplers in January of 2011 showed 60-79% left and 0-39% right stenosis. Followup is recommended in one year. I last saw him in January of 2011. He was complaining of increased dyspnea and we increased his diuretics. Since then he continues to have dyspnea on exertion relieved with rest. There is no orthopnea, PND but there is mild pedal edema. There is no chest pain, palpitations or syncope.  Current Medications (verified): 1)  Metoprolol Tartrate 25 Mg Tabs (Metoprolol Tartrate) .... Take One Tablet By Mouth Twice A Day 2)  Simvastatin 40 Mg Tabs (Simvastatin) .... Take One Tablet By Mouth Daily At Bedtime 3)  Finasteride 5 Mg Tabs (Finasteride) .... Take One Tablet Once Daily 4)   Flunisolide 0.025 % Soln (Flunisolide) .... Use 2 Sprays in Each Nostril Two Times A Day 5)  Ocuvite  Tabs (Multiple Vitamins-Minerals) .... 2 Tabs Two Times A Day 6)  Citalopram Hydrobromide 20 Mg Tabs (Citalopram Hydrobromide) .... Take One Tablet Once Daily 7)  Pantoprazole Sodium 40 Mg Tbec (Pantoprazole Sodium) .Marland Kitchen.. 1 Tab By Mouth Two Times A Day 8)  Allopurinol 300 Mg Tabs (Allopurinol) .... Take One Tablet Once Daily 9)  Furosemide 40 Mg Tabs (Furosemide) .... Take One Tablet Alt With One Half Tablet Everyother Day 10)  Polyethylene Glycol 3350  Liqd (Polyethylene Glycol 3350) .... Take Once Daily 11)  Vitamin B-12 100 Mcg Tabs (Cyanocobalamin) .Marland Kitchen.. 1 Tab By Mouth Once Daily 12)  Aspirin 81 Mg  Tabs (Aspirin) .Marland Kitchen.. 1 Tab By Mouth Once Daily  Allergies (verified): No Known Drug Allergies  Past History:  Past Medical History: Osteoarthritis Gout Hypertension History of coronary disease GERD congestive heart failure and diastolic dysfunction History of GI bleed History of pacemaker-Medtronic Adapta ADDro1 Kidney stone Peptic ulcer disease BPH Macular degeneration of the right eye, near blind Hyperlipidemia History of cerebrovascular disease     Past Surgical History: Reviewed history from 07/28/2009 and no changes required. Pacemaker-implantation Medtronic Adapta ADDro1 History of cholecystectomy History of knee replacement  Social History: Reviewed history from 04/10/2009 and no changes required. No alcohol.  He is married for 20 years.  He has 2   children and 3 grandchildren.  No tobacco use.  He is retired.   Review of Systems       Problems with Arthralgias but no fevers or chills, productive cough, hemoptysis,  dysphasia, odynophagia, melena, hematochezia, dysuria, hematuria, rash, seizure activity, orthopnea, PND,  claudication. Remaining systems are negative.   Vital Signs:  Patient profile:   77 year old male Height:      68 inches Weight:      196  pounds Pulse rate:   86 / minute Pulse rhythm:   regular BP sitting:   130 / 80  (left arm) Cuff size:   large  Vitals Entered By: Danielle Rankin, CMA (September 27, 2009 10:15 AM)  Physical Exam  General:  Well-developed well-nourished in no acute distress.  Skin is warm and dry.  HEENT is normal.  Neck is supple. No thyromegaly.  Chest is clear to auscultation with normal expansion.  Cardiovascular exam is regular rate and rhythm. 1/6 systolic murmur at left sternal border. Abdominal exam nontender or distended. No masses palpated. Extremities show trace to 1+ edema left greater than right. neuro grossly intact    PPM Specifications Following MD:  Sherryl Manges, MD     PPM Vendor:  Medtronic     PPM Model Number:  ADDR01     PPM Serial Number:  [id number] PPM DOI:  04/07/2007      Lead 1    Location: RA     DOI: 04/07/2007     Model #: 5076     Serial #: [id number]     Status: active Lead 2    Location: RV     DOI: 04/07/2007     Model #: [id number]     Serial #: [id number]     Status: active   Indications:  SYNCOPE   PPM Follow Up Pacer Dependent:  No      Episodes Coumadin:  No  Parameters Mode:  DDDR+     Lower Rate Limit:  60     Upper Rate Limit:  130 Paced AV Delay:  200     Sensed AV Delay:  180  Impression & Recommendations:  Problem # 1:  KIDNEY DISEASE (ICD-593.9) Followed by nephrology.  Problem # 2:  ACUTE ON CHRONIC DIASTOLIC HEART FAILURE (ICD-428.33) Patient continues to complain of dyspnea. He has mild pedal edema but his BNP was only minimally elevated previously. Increasing his diuretics caused significant increase in BUN and creatinine. I will continue at present dose. The following medications were removed from the medication list:    Salsalate 500 Mg Tabs (Salsalate) .Marland Kitchen... Take one tablet by mouth two times a day His updated medication list for this problem includes:    Metoprolol Tartrate 25 Mg Tabs (Metoprolol tartrate) .Marland Kitchen... Take one tablet by mouth  twice a day    Furosemide 40 Mg Tabs (Furosemide) .Marland Kitchen... Take one tablet alt with one half tablet everyother day    Aspirin 81 Mg Tabs (Aspirin) .Marland Kitchen... 1 tab by mouth once daily  Problem # 3:  CAD (ICD-414.00) Continue aspirin, beta blocker and statin. The following medications were removed from the medication list:    Salsalate 500 Mg Tabs (Salsalate) .Marland Kitchen... Take one tablet by mouth two times a day His updated medication list for this problem includes:    Metoprolol Tartrate 25 Mg Tabs (Metoprolol tartrate) .Marland Kitchen... Take one tablet by mouth twice a day    Aspirin 81 Mg Tabs (Aspirin) .Marland Kitchen... 1 tab by mouth once daily  Problem # 4:  HYPERLIPIDEMIA (ICD-272.4) Continue statin. His updated medication list for this problem includes:    Simvastatin 40 Mg Tabs (Simvastatin) .Marland Kitchen... Take one tablet by mouth daily at bedtime  Problem #  5:  ESSENTIAL HYPERTENSION, BENIGN (ICD-401.1) Blood pressure controlled on present medications. Will continue. The following medications were removed from the medication list:    Salsalate 500 Mg Tabs (Salsalate) .Marland Kitchen... Take one tablet by mouth two times a day His updated medication list for this problem includes:    Metoprolol Tartrate 25 Mg Tabs (Metoprolol tartrate) .Marland Kitchen... Take one tablet by mouth twice a day    Furosemide 40 Mg Tabs (Furosemide) .Marland Kitchen... Take one tablet alt with one half tablet everyother day    Aspirin 81 Mg Tabs (Aspirin) .Marland Kitchen... 1 tab by mouth once daily  Problem # 6:  DYSPNEA (ICD-786.05) I am not convinced this is all secondary to heart failure. I will schedule him to have a chest x-ray and PFTs. If abnormal then he may need a pulmonary evaluation. The following medications were removed from the medication list:    Salsalate 500 Mg Tabs (Salsalate) .Marland Kitchen... Take one tablet by mouth two times a day His updated medication list for this problem includes:    Metoprolol Tartrate 25 Mg Tabs (Metoprolol tartrate) .Marland Kitchen... Take one tablet by mouth twice a day     Furosemide 40 Mg Tabs (Furosemide) .Marland Kitchen... Take one tablet alt with one half tablet everyother day    Aspirin 81 Mg Tabs (Aspirin) .Marland Kitchen... 1 tab by mouth once daily  Orders: T-2 View CXR (71020TC) Pulmonary Function Test (PFT)  Problem # 7:  CARDIAC PACEMAKER MEDTRONIC ADAPTA ADDRO1 (ICD-V45.01) Management per EP.  Problem # 8:  CAROTID OCCLUSIVE DISEASE (ICD-433.10) Continue aspirin and statin. Followup carotid Dopplers January 2012. The following medications were removed from the medication list:    Salsalate 500 Mg Tabs (Salsalate) .Marland Kitchen... Take one tablet by mouth two times a day His updated medication list for this problem includes:    Aspirin 81 Mg Tabs (Aspirin) .Marland Kitchen... 1 tab by mouth once daily  Patient Instructions: 1)  Your physician recommends that you schedule a follow-up appointment in: 6 MONTHS 2)  A chest x-ray takes a picture of the organs and structures inside the chest, including the heart, lungs, and blood vessels. This test can show several things, including, whether the heart is enlarged; whether fluid is building up in the lungs; and whether pacemaker / defibrillator leads are still in place. 3)  Your physician has recommended that you have a pulmonary function test.  Pulmonary Function Tests are a group of tests that measure how well air moves in and out of your lungs.

## 2010-07-31 NOTE — Cardiovascular Report (Addendum)
Summary: Office Visit   Office Visit   Imported By: Roderic Ovens 04/30/2010 13:23:37  _____________________________________________________________________  External Attachment:    Type:   Image     Comment:   External Document

## 2010-08-02 NOTE — Miscellaneous (Addendum)
Summary: Orders Update  Clinical Lists Changes  Orders: Added new Test order of Carotid Duplex (Carotid Duplex) - Signed 

## 2010-08-20 ENCOUNTER — Encounter (INDEPENDENT_AMBULATORY_CARE_PROVIDER_SITE_OTHER): Payer: Self-pay | Admitting: *Deleted

## 2010-08-28 NOTE — Cardiovascular Report (Addendum)
Summary: Office Visit Remote   Office Visit Remote   Imported By: Roderic Ovens 08/24/2010 11:21:07  _____________________________________________________________________  External Attachment:    Type:   Image     Comment:   External Document

## 2010-08-28 NOTE — Letter (Addendum)
Summary: Remote Device Check  [facility] [employer], Main Office  [address]   [place], [place] [address]   Phone: [phone]  Fax: [phone]     August 20, 2010 MRN: [id number]   KEL SENN [address] [place], [place]  [address]   Dear Mr. Paolucci,   Your remote transmission was recieved and reviewed by your physician.  All diagnostics were within normal limits for you.  __X___Your next transmission is scheduled for:  10-25-2010.  Please transmit at any time this day.  If you have a wireless device your transmission will be sent automatically.   Sincerely,  Vella Kohler

## 2010-09-13 ENCOUNTER — Ambulatory Visit (INDEPENDENT_AMBULATORY_CARE_PROVIDER_SITE_OTHER): Payer: Medicare Other | Admitting: Cardiology

## 2010-09-13 ENCOUNTER — Other Ambulatory Visit (INDEPENDENT_AMBULATORY_CARE_PROVIDER_SITE_OTHER): Payer: Medicare Other

## 2010-09-13 ENCOUNTER — Other Ambulatory Visit: Payer: Self-pay | Admitting: Cardiology

## 2010-09-13 ENCOUNTER — Encounter: Payer: Self-pay | Admitting: Cardiology

## 2010-09-13 DIAGNOSIS — I5033 Acute on chronic diastolic (congestive) heart failure: Secondary | ICD-10-CM

## 2010-09-13 DIAGNOSIS — R0602 Shortness of breath: Secondary | ICD-10-CM

## 2010-09-13 DIAGNOSIS — I10 Essential (primary) hypertension: Secondary | ICD-10-CM

## 2010-09-13 LAB — BASIC METABOLIC PANEL
Calcium: 9 mg/dL (ref 8.4–10.5)
Glucose, Bld: 70 mg/dL (ref 70–99)
Sodium: 142 mEq/L (ref 135–145)

## 2010-09-13 LAB — BRAIN NATRIURETIC PEPTIDE: Pro B Natriuretic peptide (BNP): 121.8 pg/mL — ABNORMAL HIGH (ref 0.0–100.0)

## 2010-09-18 NOTE — Assessment & Plan Note (Signed)
Summary: [facility]   Visit Type:  Follow-up Primary Provider:  Ivery Quale  CC:  sob and edema in feet and legs. pt has no other complaints today.Marland Kitchen  History of Present Illness: [name] [name] is a very pleasant gentleman who has a history of congestive heart failure, felt secondary to diastolic dysfunction; hypertension; hyperlipidemia; mild coronary artery disease; and cerebrovascular disease.  He also has had a previous pacemaker placed in December 2008 following a cardiac arrest at [facility].  This was placed at [facility] and apparently was bradycardia mediated.  His most recent cardiac catheterization here was in April 2005.  At that time, he had nonobstructive coronary artery disease, but he did have elevated right and left filling pressures. Last Myoview was performed on February 05, 2007. Ejection fraction was 65% and the perfusion was normal. His most recent echocardiogram  was performed in February of 2011 and showed normal LV function, mild aortic stenosis with a mean gradient of 9 mmHg, mild mitral and tricuspid regurgitation and mild biatrial enlargement.  Carotid Dopplers in Jan 2012 showed 60-79% left and 0-39% right stenosis. Followup is recommended in one year. I last saw him in Sept of 2011. Since then, he notices increased dyspnea on exertion. There is no orthopnea, PND, palpitations, chest pain or syncope. He has had some pedal edema.  Current Medications (verified): 1)  Metoprolol Tartrate 25 Mg Tabs (Metoprolol Tartrate) .... Take One Tablet By Mouth Twice A Day 2)  Atorvastatin Calcium 80 Mg Tabs (Atorvastatin Calcium) .... 1/2 Tab At Bedtime 3)  Finasteride 5 Mg Tabs (Finasteride) .... Take One Tablet Once Daily 4)  Flunisolide 0.025 % Soln (Flunisolide) .... Use 2 Sprays in Each Nostril Two Times A Day 5)  Ocuvite  Tabs (Multiple Vitamins-Minerals) .... 2 Tabs Two Times A Day 6)  Citalopram Hydrobromide 20 Mg Tabs (Citalopram Hydrobromide) .... Take One  Half Tablet Once Daily 7)  Pantoprazole Sodium 40 Mg Tbec (Pantoprazole Sodium) .Marland Kitchen.. 1 Tab By Mouth Two Times A Day 8)  Allopurinol 300 Mg Tabs (Allopurinol) .... Take One Tablet Once Daily 9)  Furosemide 40 Mg Tabs (Furosemide) .... Take One Tablet Every Other Day, 1/2 Tablet The Rest 10)  Polyethylene Glycol 3350  Liqd (Polyethylene Glycol 3350) .... Take Once Daily 11)  Vitamin B-12 100 Mcg Tabs (Cyanocobalamin) .Marland Kitchen.. 1 Tab By Mouth Once Daily 12)  Aspirin 81 Mg  Tabs (Aspirin) .Marland Kitchen.. 1 Tab By Mouth Once Daily  Allergies (verified): No Known Drug Allergies  Past History:  Past Medical History: Reviewed history from 09/27/2009 and no changes required. 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 Pacemaker-implantation Medtronic Adapta ADDro1 History of cholecystectomy History of knee replacement  Social History: Reviewed history from 04/10/2009 and no changes required. No alcohol.  He is married for 20 years.  He has 2   children and 3 grandchildren.  No tobacco use.  He is retired.   Review of Systems       no fevers or chills, productive cough, hemoptysis, dysphasia, odynophagia, melena, hematochezia, dysuria, hematuria, rash, seizure activity, orthopnea, PND, pedal edema, claudication. Remaining systems are negative.   Vital Signs:  Patient profile:   77 year old male Height:  68 inches Weight:      191.50 pounds BMI:     29.22 Pulse rate:   66 / minute Resp:     18 per minute BP sitting:   118 / 72  (left arm) Cuff size:   regular  Vitals Entered By: [name] [name], CMA (September 13, 2010 10:47  AM)  Physical Exam  General:  Well-developed well-nourished in no acute distress.  Skin is warm and dry.  HEENT is normal.  Neck is supple. No thyromegaly.  Chest is clear to auscultation with normal expansion.  Cardiovascular exam is regular rate and rhythm. 2/6 systolic murmur left sternal border. Abdominal exam nontender or distended. No masses palpated. Extremities show 1+ edema. neuro grossly intact    EKG  Procedure date:  09/13/2010  Findings:      V. paced  PPM Specifications Following MD:  Sherryl Manges, MD     PPM Vendor:  Medtronic     PPM Model Number:  ADDR01     PPM Serial Number:  [id number] PPM DOI:  04/07/2007      Lead 1    Location: [facility]     DOI: 04/07/2007     Model #: 5076     Serial #: [id number]     Status: active Lead 2    Location: RV     DOI: 04/07/2007     Model #: 1308     Serial #: [id number]     Status: active   Indications:  SYNCOPE   PPM Follow Up Pacer Dependent:  Yes      Episodes Coumadin:  No  Parameters Mode:  DDDR+     Lower Rate Limit:  60     Upper Rate Limit:  130 Paced AV Delay:  200     Sensed AV Delay:  180 Rate Response Parameters:  ADL response 3 Exertion response 2  Impression & Recommendations:  Problem # 1:  KIDNEY DISEASE (ICD-593.9) Followed by nephrrology.  Problem # 2:  ACUTE ON CHRONIC DIASTOLIC HEART FAILURE (ICD-428.33)  Volume overloaded today. Check potassium, renal function and BNP today and in one week. Increase Lasix to 40 mg daily. His updated medication list for this problem includes:    Metoprolol Tartrate 25 Mg Tabs (Metoprolol tartrate) .Marland Kitchen... Take one tablet by mouth twice a day    Furosemide 40 Mg Tabs (Furosemide) .Marland Kitchen... Take one tablet every other day, 1/2 tablet the rest    Aspirin 81 Mg Tabs (Aspirin) .Marland Kitchen... 1 tab by mouth once daily  His updated medication list for this problem includes:    Metoprolol Tartrate 25 Mg Tabs (Metoprolol tartrate) .Marland Kitchen... Take one tablet by mouth twice a day     Furosemide 40 Mg Tabs (Furosemide) .Marland Kitchen... Take one tablet by mouth once daily    Aspirin 81 Mg Tabs (Aspirin) .Marland Kitchen... 1 tab by mouth once daily  Problem # 3:  CAD (ICD-414.00)  Continue aspirin and statin. His updated medication list for this problem includes:    Metoprolol Tartrate 25 Mg Tabs (Metoprolol tartrate) .Marland Kitchen... Take one tablet by mouth twice a day    Aspirin 81 Mg Tabs (Aspirin) .Marland Kitchen... 1 tab by mouth once daily  His updated medication list for this problem includes:    Metoprolol Tartrate 25 Mg Tabs (Metoprolol tartrate) .Marland Kitchen... Take one tablet by mouth twice a day    Aspirin 81 Mg Tabs (Aspirin) .Marland Kitchen... 1 tab by mouth once daily  Problem # 4:  HYPERLIPIDEMIA (ICD-272.4)  Continue statin. Lipid  and liver monitored by primary care. His updated medication list for this problem includes:    Atorvastatin Calcium 80 Mg Tabs (Atorvastatin calcium) .Marland Kitchen... 1/2 tab at bedtime  His updated medication list for this problem includes:    Atorvastatin Calcium 80 Mg Tabs (Atorvastatin calcium) .Marland Kitchen... 1/2 tab at bedtime  Problem # 5:  ESSENTIAL HYPERTENSION, BENIGN (ICD-401.1)  Blood pressure control. Continue present medications. His updated medication list for this problem includes:    Metoprolol Tartrate 25 Mg Tabs (Metoprolol tartrate) .Marland Kitchen... Take one tablet by mouth twice a day    Furosemide 40 Mg Tabs (Furosemide) .Marland Kitchen... Take one tablet by mouth once daily    Aspirin 81 Mg Tabs (Aspirin) .Marland Kitchen... 1 tab by mouth once daily  His updated medication list for this problem includes:    Metoprolol Tartrate 25 Mg Tabs (Metoprolol tartrate) .Marland Kitchen... Take one tablet by mouth twice a day    Furosemide 40 Mg Tabs (Furosemide) .Marland Kitchen... Take one tablet by mouth once daily    Aspirin 81 Mg Tabs (Aspirin) .Marland Kitchen... 1 tab by mouth once daily  Problem # 6:  CARDIAC PACEMAKER MEDTRONIC ADAPTA ADDRO1 (ICD-V45.01) Management per electrophysiology.  Problem # 7:  CAROTID OCCLUSIVE DISEASE (ICD-433.10)  Continue aspirin  and statin. Followup carotid Dopplers January 2013. His updated medication list for this problem includes:    Aspirin 81 Mg Tabs (Aspirin) .Marland Kitchen... 1 tab by mouth once daily  His updated medication list for this problem includes:    Aspirin 81 Mg Tabs (Aspirin) .Marland Kitchen... 1 tab by mouth once daily  Other Orders: TLB-BMP (Basic Metabolic Panel-BMET) (80048-METABOL) TLB-BNP (B-Natriuretic Peptide) (83880-BNPR)  Patient Instructions: 1)  Your physician recommends that you schedule a follow-up appointment in: 3 MONTHS 2)  Your physician recommends that you return for lab work in:ONE WEEK=THURSDAY 09-20-10 AFTER 8:30AM 3)  Your physician has recommended you make the following change in your medication: INCREASE FUROSEMIDE TO 40MG  ONE TABLET ONCE DAILY Prescriptions: FUROSEMIDE 40 MG TABS (FUROSEMIDE) Take one tablet by mouth once daily  #30 x 12   Entered by:   Deliah Goody, RN   Authorized by:   Ferman Hamming, MD, [facility]   Signed by:   Deliah Goody, RN on 09/13/2010   Method used:   Faxed to ...       [facility] [address] (retail)       [address]       [place], [place]  [address]  [place]       Ph: [phone]       Fax: [phone]   RxID:   [id number]

## 2010-09-20 ENCOUNTER — Ambulatory Visit (INDEPENDENT_AMBULATORY_CARE_PROVIDER_SITE_OTHER): Payer: Medicare Other | Admitting: *Deleted

## 2010-09-20 DIAGNOSIS — I5033 Acute on chronic diastolic (congestive) heart failure: Secondary | ICD-10-CM

## 2010-09-20 DIAGNOSIS — Z79899 Other long term (current) drug therapy: Secondary | ICD-10-CM

## 2010-09-20 DIAGNOSIS — R0602 Shortness of breath: Secondary | ICD-10-CM

## 2010-09-20 LAB — BASIC METABOLIC PANEL
BUN: 27 mg/dL — ABNORMAL HIGH (ref 6–23)
Creatinine, Ser: 1.6 mg/dL — ABNORMAL HIGH (ref 0.4–1.5)
GFR: 42.7 mL/min — ABNORMAL LOW (ref >60.00)
Glucose, Bld: 90 mg/dL (ref 70–99)
Potassium: 3.9 mEq/L (ref 3.5–5.1)

## 2010-09-21 ENCOUNTER — Telehealth: Payer: Self-pay | Admitting: *Deleted

## 2010-09-21 NOTE — Telephone Encounter (Signed)
Spoke with pt,  pt aware of results. 

## 2010-09-21 NOTE — Telephone Encounter (Signed)
Message copied by Deliah Goody on Fri Sep 21, 2010  9:27 AM ------      Message from: Olga Millers      Created: Thu Sep 20, 2010  3:52 PM       ok

## 2010-10-25 ENCOUNTER — Other Ambulatory Visit: Payer: Self-pay

## 2010-10-25 ENCOUNTER — Ambulatory Visit (INDEPENDENT_AMBULATORY_CARE_PROVIDER_SITE_OTHER): Payer: Medicare Other | Admitting: *Deleted

## 2010-10-25 DIAGNOSIS — I442 Atrioventricular block, complete: Secondary | ICD-10-CM

## 2010-10-29 ENCOUNTER — Ambulatory Visit
Admission: RE | Admit: 2010-10-29 | Discharge: 2010-10-29 | Disposition: A | Discharge status: Home / Self Care | Payer: Medicare Other | Source: Ambulatory Visit | Attending: Nephrology | Admitting: Nephrology

## 2010-10-29 ENCOUNTER — Other Ambulatory Visit: Payer: Self-pay | Admitting: Nephrology

## 2010-10-29 DIAGNOSIS — M7989 Other specified soft tissue disorders: Secondary | ICD-10-CM

## 2010-11-04 NOTE — Progress Notes (Signed)
Pacer remote check  

## 2010-11-07 ENCOUNTER — Encounter: Payer: Self-pay | Admitting: *Deleted

## 2010-11-13 NOTE — Assessment & Plan Note (Signed)
[facility]                            CARDIOLOGY OFFICE NOTE   NAME:Punch, SANJUAN SAWA                      MRN:          [id number]  DATE:07/22/2007                            DOB:          13-May-1924    Mr. Lefebre is a gentleman has a history of congestive heart failure felt  secondary diastolic function, hypertension, hyperlipidemia,  cerebrovascular disease.  When I last saw him on June 09, 2007 it was  noted that he had a cardiac arrest at [facility].  Apparently  this was bradycardia mediated but those records were not available.  We  have asked for those from [facility].  We still do not have them  available.  He did have a pacemaker placed and apparently also had a  cardiac catheterization.  We are awaiting those.  Since I last saw him  he denies any dyspnea, chest pain, palpitations or syncope.  His pedal  edema is unchanged.   MEDICATIONS:  Lisinopril 20 mg p.o. daily, finasteride 5 mg daily,  omeprazole 20 mg daily, salsalate 500 mg p.o. b.i.d., Ocuvite,  allopurinol, aspirin 81 mg daily, Lasix 10 mg daily, potassium 10 mEq  daily, Caltrate and Vytorin 10/80 one-half p.o. daily.   PHYSICAL EXAM:  Today shows a blood pressure of 152/83.  His pulse is  86.  HEENT:  Normal.  Neck is supple.  CHEST:  Clear.  CARDIOVASCULAR:  Regular.  Abdominal exam shows no tenderness.  EXTREMITIES:  Show trace edema.   DIAGNOSES:  1. Status post recent cardiac arrest and pacemaker placement - we are      awaiting other records from [facility] and he will continue      be monitored in the pacemaker clinic.  2. History of coronary disease - he will continue on his aspirin, ACE      inhibitor, statin.  We had not had a beta blocker in the past due      to his history of bradycardia conduction abnormalities.  However,      with his pacemaker in place we will begin Toprol 25 mg p.o. daily      and increase as tolerated.  3. History of  congestive heart failure secondary to diastolic      dysfunction - he will continue on his diuretics and potassium.  4. Hypertension - his blood pressure mildly elevated but we are adding      a beta blocker.  5. Hyperlipidemia - continue on his Vytorin for now.  6. Cerebrovascular disease - he will need follow-up carotid Dopplers      in June.  7. History of macrocytosis.   We will see him back in 6 months.     Madolyn Frieze Jens Som, MD, [facility]  Electronically Signed    BSC/MedQ  DD: 07/22/2007  DT: 07/22/2007  Job #: [id number]   cc:   [name], M.D.

## 2010-11-13 NOTE — Discharge Summary (Signed)
NAME:  AYHAM, WORD NO.:  [id number]   MEDICAL RECORD NO.:  [id number]          PATIENT TYPE:  INP   LOCATION:  5530                         FACILITY:  [facility]   PHYSICIAN:  [name], M.D.DATE OF BIRTH:  Dec 16, 1923   DATE OF ADMISSION:  08/16/2007  DATE OF DISCHARGE:  08/20/2007                               DISCHARGE SUMMARY   PERTINENT FINDINGS:  The patient is an 77 year old white male with  several medical problems who was in his usual state of health when he  woke up at approximately 6:30 a.m., on the morning of admission, with  malaise and right upper quadrant epigastric pain.  The pain was  described as pressure with indigestion.  It worsened with nausea without  vomiting, so he presented to the Emergency Room for evaluation.  In the  emergency room, he was given Reglan and morphine.  A CT scan of the  abdomen and pelvis suggested acute pancreatitis.  After the CT scan he  had mid substernal chest pain lasting less than 2 minutes which resolved  with two nitroglycerin tablets.  He was admitted for further evaluation.   PAST MEDICAL HISTORY:  1. Osteoarthritis.  2. Gout.  3. Hypertension.  4. Coronary artery disease status post myocardial infarction.  5. Gastroesophageal reflux disease.  6. Cardiomyopathy with congestive heart failure and diastolic      dysfunction.  7. History of GI bleed.  8. History of pacemaker dependence.  9. Nephrolithiasis.  10.Peptic ulcer disease, 1997.  11.Cholecystectomy.  12.Right total knee replacement in 2000.  13.Benign prostatic hypertrophy.  14.Macular degeneration in the right eye with near blindness.  15.Hyperlipidemia.  16.Cerebrovascular disease.   ALLERGIES:  No known drug allergies.   MEDICATIONS PRIOR TO ADMISSION:  1. Lasix 20 mg, 1/2 tab p.o. daily.  2. Potassium chloride 20 mEq p.o. daily.  3. Allopurinol 100 mg p.o. daily.  4. Omeprazole 20 mg p.o. daily.  5. Salicylate 500 mg p.o.  b.i.d.  6. Lisinopril 40 mg, 1/2 tab p.o. daily.  7. Finasteride 5 mg p.o. daily.  8. Caltrate-D 600 twice daily.  9. Ocuvite twice daily.  10.Aspirin 81 mg daily.  11.Zetia 10 mg daily.  12.Toprol-XL 25 mg daily.  13.Vytorin 10/80, unclear if he still taking this.   INITIAL PHYSICAL EXAM:  VITAL SIGNS:  Blood pressure 120/67, pulse 51,  respirations 20, temp 97, pulse oxygen saturation 98% on room air.  GENERAL:  He is a mildly overweight white male who was in no apparent  distress.  HEENT EXAM:  Within normal limits.  CHEST:  Clear to auscultation.  HEART:  Regular rate and rhythm.  ABDOMEN:  Normal bowel sounds with some distention and mild epigastric  tenderness.  EXTREMITIES:  Without cyanosis, clubbing, or edema.   INITIAL LABORATORY STUDIES:  White blood cell count 7, hemoglobin 15.2,  platelets 154,000.  Urinalysis was normal.  CPK and troponin-I were  normal.  Serum sodium 139, potassium 4.2, chloride 101, carbon dioxide  31.  BUN 19, creatinine 1.16, glucose 87.  Liver associated enzymes were  normal.  Lipase 281.  An  electrocardiogram showed normal sinus rhythm  with right bundle branch block.  Abdomen and pelvis CT showed mild  pancreatitis in the head of the pancreas with no biliary duct  dilatation.   HOSPITAL COURSE:  Patient was admitted to a medical bed with telemetry.  He had serial CPK levels of 101 and 100 with troponin-I levels of 0.01  and 0.02 that ruled out myocardial infarction.  He was treated with IV  fluids and pain medications.  His epigastric pain rapidly resolved with  bowel rest.  He was also started on TriCor 145 mg daily.  He was seen by  a GI consultant who recommended an endoscopy and checking a CA-19-9  level.  The endoscopy was normal and the CA-19-9 level was 48.7 (normal  less than 35).  His abdominal pain resolved entirely and he was able to  eat a mechanical soft diet without difficulty.   CONDITION ON DISCHARGE:  He has no abdominal  pain.  He had mild  constipation which resolved.  He has had no further episodes of chest  pain.   Most recent vital signs include:  Blood pressure 163/76, pulse 74,  respirations 25, temp 97.8, pulse oxygen saturation 97% on room air.  GENERAL:  He is a mildly overweight white male who is in no apparent  distress.  CHEST:  Clear to auscultation.  HEART:  A regular rate and rhythm without significant murmur or gallop.  ABDOMEN:  Normal bowel sounds and no hepatosplenomegaly or tenderness.  EXTREMITIES:  Without cyanosis, clubbing, or edema.   Most recent laboratory tests include:  Serum lipase 34 and amylase 64  with CA-19-9 48.7, serum sodium 139, potassium 4.5 chloride 105, carbon  dioxide 28.  BUN 17, creatinine 1.31, glucose 88.  BNP 186.  White blood  cell count 7.3, hemoglobin 13, hematocrit 38, platelets 124,000.   DISCHARGE DIAGNOSES:  1. Acute pancreatitis, idiopathic and resolved.  2. Abnormal CA-19-9 level.  3. Coronary artery disease.  4. Constipation.  5. Hypertension.  6. Benign prostatic hypertrophy.  7. Gastroesophageal reflux disease.  8. Osteoarthritis.  9. Macular degeneration.  10.Gout.  11.Hyperlipidemia.   DISCHARGE MEDICATIONS:  1. Tylenol 500 mg p.o. t.i.d. p.r.n. pain.  2. Aspirin 81 mg p.o. daily.  3. Imdur 30 mg p.o. daily.  4. Nitroglycerin 0.4 mg sublingual p.r.n. chest pain.  5. GlycoLax 17 grams p.o. daily.  6. Senna, 1 to 2 tabs p.o. daily p.r.n. constipation.  7. Lisinopril 40 mg, 1/2 tab p.o. daily.  8. Finasteride 5 mg, 1 tab p.o. daily.  9. Omeprazole 20 mg p.o. daily.  10.Salicylate 500 mg p.o. b.i.d. p.r.n. pain.  11.Ocuvite, 1 tab p.o. b.i.d.  12.Allopurinol 100 mg p.o. daily.  13.Lasix 20 mg, 1/2 tab p.o. daily.  14.Potassium chloride 20 mEq, 1/2 tab p.o. daily.  15.Toprol-XL 25 mg p.o. daily.  16.Caltrate-D 600, 2 tabs p.o. daily.  17.Vytorin 10/80, 1/2 tab p.o. daily.   DISPOSITION AND FOLLOWUP:  He should have a followup  appointment with  Dr. Jarome Matin at [facility] in approximately 2  weeks following discharge.  He should also have a followup evaluation  with Dr. Dorena Cookey, at [facility], in approximately 1 month  following discharge.           ______________________________  [name], M.D.     DGP/MEDQ  D:  08/20/2007  T:  08/20/2007  Job:  [id number]   cc:   Everardo All. Madilyn Fireman, M.D.  Madolyn Frieze Jens Som, MD,  [facility]  Graylin Shiver, M.D.

## 2010-11-13 NOTE — Op Note (Signed)
NAME:  [name] [name], [name] [name] NO.:  [id number]   MEDICAL RECORD NO.:  [id number]          PATIENT TYPE:  INP   LOCATION:  2623                         FACILITY:  [facility]   PHYSICIAN:  Graylin Shiver, M.D.   DATE OF BIRTH:  21-Aug-1923   DATE OF PROCEDURE:  08/18/2007  DATE OF DISCHARGE:                               OPERATIVE REPORT   PROCEDURE PERFORMED:  Esophagogastroduodenoscopy.   INDICATIONS FOR PROCEDURE:  The patient is in the hospital with acute  pancreatitis of uncertain etiology.  He has been having intermittent  episodes of epigastric pain for quite some time.  Endoscopy is being  done to determine if there might be an ulcer. Informed consent was  obtained after explanation of the risks of bleeding, infection and  perforation.   PREMEDICATION:  Fentanyl 35 mcg IV and Versed 2 mg IV.   PROCEDURE:  With the patient in the left lateral decubitus position, the  Pentax gastroscope was inserted into the oropharynx and passed into the  esophagus.  It was advanced down the esophagus then into the stomach and  into the duodenum.  The second portion and bulb of the duodenum looked  normal.  I was able to see the papilla of Vater.  It looked normal.  The  stomach looked normal in its entirety.  The esophagus looked normal in  its entirety.  The esophagogastric junction was at 39 cm.  He tolerated  the procedure well without complications.   IMPRESSION:  Normal esophagogastroduodenoscopy.           ______________________________  Graylin Shiver, M.D.     SFG/MEDQ  D:  08/18/2007  T:  08/18/2007  Job:  [id number]   cc:   [name], M.D.  John C. Madilyn Fireman, M.D.

## 2010-11-13 NOTE — Consult Note (Signed)
NAME:  [name] [name], [name] [name] NO.:  [id number]   MEDICAL RECORD NO.:  [id number]          PATIENT TYPE:  INP   LOCATION:  2623                         FACILITY:  [facility]   PHYSICIAN:  [name] [name], M.D.   DATE OF BIRTH:  Mar 28, 1924   DATE OF CONSULTATION:  08/17/2007  DATE OF DISCHARGE:                                 CONSULTATION   REFERRING PHYSICIAN:  We were asked to see [name] [name], today, in  consultation for pancreatitis by Dr. Barry [name].  [place].   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  He states that the pain  was constant in his epigastrium.  He waited as long as he could, but  when he determined that the pain was not going to resolve, he came to  the [facility] Emergency Room.  The patient tells me that he has had  postprandial epigastric pain for several months.  He takes omeprazole  daily.  He does have a history of peptic ulcer disease back in 1986.  He  has also been having mild nausea, mild bloating, and he denies any  change in bowel habits.  No melena, no hematochezia, no vomiting, or  anorexia before yesterday morning.  His only new medication in the last  6 months has been Toprol XL, which was added when he had his pacemaker  placed.   PAST MEDICAL HISTORY SIGNIFICANT FOR:  1. Congestive heart failure.  2. Hypertension.  3. Peptic ulcer disease with a [name] bleed in 1986.  4. Gout.  5. Kidney stone.  6. Benign prostate hypertrophy.  7. DVT.  8. Pulmonary emboli.  9. Ankle fracture.   PRIMARY CARE PHYSICIAN:  [name] [name]. [name] [name], M.D.   PRIMARY CARDIOLOGIST:  [name] [name]. [name] Som, MD.   GASTROENTEROLOGIST:  He sees [name] [name] at [name] [name].   SURGERIES INCLUDE:  1. A total knee replacement.  2. TURP.  3. Angioplasty.  4. Cholecystectomy.  5. Hernia repair.  6. Pacemaker placement last  fall.  7. His last upper endoscopy was in 2002 in [place], [place] at      [facility] just prior to his cholecystectomy.   CURRENT MEDICATIONS INCLUDE:  Lasix, isotretin, allopurinol,  finasteride, lisinopril, omeprazole, salicylate, and Toprol.   ALLERGIES:  He has no known drug allergies.   SOCIAL HISTORY:  Negative for alcohol, tobacco and drugs.   FAMILY HISTORY:  Negative for stomach ulcers, pancreatitis or pancreatic  cancer.   PHYSICAL EXAM:  GENERAL:  He is alert and oriented, currently in no  apparent distress.  Denies pain.  VITAL SIGNS:  Temperature 98.4, pulse 65, respirations 18, blood  pressure is 135/68.  HEENT:  Eyes show no icterus.  SKIN:  Shows no jaundice.  CARDIOVASCULAR SYSTEM:  Regular rate and rhythm with no murmurs, rubs,  or gallops.  LUNGS:  Clear to auscultation with no wheezes or crackles, although he  does have decreased breath sounds.  ABDOMEN:  Shows  mild-to-moderate distention.  It is soft.  He has mild  diffuse tenderness throughout.  He has positive bowel sounds.   LABS:  Show a lipase of 622, triglycerides 79, hemoglobin is 14.4 that  is down from 15.2 on admission, hematocrit  41.8, [name] count 9,  platelets are 131,000.  His BMET is within normal limits.  LFTs show a  mildly elevated total bilirubin at 1.3.  He is guaiac negative.  CT scan  done, yesterday, shows mild stranding along the pancreatic head, no  biliary ductal dilatation, no signs of common bile duct stones.   ASSESSMENT:  Dr. Herbert [name] has seen and examined the patient.   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  Also he has a history of peptic  ulcer disease and has been having upper tract symptoms.  Will evaluate  with upper endoscopy tomorrow morning for a possible gastric ulcer as  the cause for his pancreatitis.    PLAN:  Agree with Protonix, discontinue Reglan if the patient is no  longer nauseated.  Plan on upper endoscopy tomorrow morning.  Thanks  very much for this consultation we will follow with you.      [name] Police, PA    ______________________________  [name] [name], M.D.    [name] [name]  D:  08/17/2007  T:  08/18/2007  Job:  [id number] [id number]   cc:   [name] [name]. [name] [name], M.D.  [name] [name] [name] Som, MD, [facility]  [name] [name], M.D.  [name] [name], M.D.

## 2010-11-13 NOTE — Assessment & Plan Note (Signed)
[facility]                            CARDIOLOGY OFFICE NOTE   NAME:[name] [name] ARATA                      MRN:          [id number]  DATE:02/03/2007                            DOB:          17-May-1924    [name] [name] is a pleasant gentleman who has a history of congestive heart  failure secondary to diastolic dysfunction, hypertension,  hyperlipidemia, cerebral vascular disease.  Since I last saw him, he was  seen in the emergency room for left upper extremity pain on January 22, 2007.  The etiology apparently was not clear.  His white blood cell  count had increased to 6.4 at that time, and his hemoglobin was 14.9.  His renal function showed a BUN and creatinine of 15 and 1.26  respectively.  His liver functions were normal.  He had cardiac markers  that were normal, and a chest x-ray showed atelectasis in both lungs,  but otherwise was unremarkable.  He had followup carotid Dopplers as  well on January 13, 2007.  He had a 40% to 59% right, and a 60% to 79%  left.  Since that time, he does have some dyspnea on exertion, which has  been a chronic issue, but there is no orthopnea or PND.  His pedal edema  is unchanged.  He has not had chest pain or syncope.   MEDICATIONS:  Include:  1. Zocor 80 mg p.o. daily.  2. Lisinopril 20 mg p.o. daily.  3. Finasteride 5 mg p.o. daily.  4. Omeprazole.  5. Salicylate.  6. Ocuvite.  7. Allopurinol.  8. Aspirin 81 mg p.o. daily.  9. Lasix 10 mg p.o. daily.  10.Potassium 10 mEq p.o. daily.  11.Zetia 5 mg p.o. daily.   PHYSICAL EXAMINATION:  Shows a blood pressure of 114/64.  His pulse is  56.  He weighs 190 pounds.  HEENT:  Normal.  NECK:  Supple.  CHEST:  Clear.  CARDIOVASCULAR EXAM:  Regular rate and rhythm.  ABDOMINAL EXAM:  Benign.  EXTREMITIES:  Show trace to 1 plus ankle edema.  Electrocardiogram shows sinus rhythm with a 1st degree A-V block.  There  is a right bundle branch block, and a prior inferior  infarct.   DIAGNOSIS:  1. History of mild coronary disease by previous catheterization.  He      has had no chest pain or shortness of breath, but he did have left      upper extremity pain.  We will risk stratify with a stress Myoview.      If this shows no ischemia, we will continue with medical therapy      including his aspirin, statin, and ACE inhibitor.  He is not on a      beta blocker secondary to his borderline heart rate and conduction      abnormalities.  2. History of syncope.  No recurrent episodes.  3. History of congestive heart failure secondary to diastolic      dysfunction.  He will continue on his diuretics, and potassium.  4. Hypertension.  His blood pressure is well controlled on his present  medications.  5. Hyperlipidemia.  We will check lipids, and adjust with a goal LDL      of less than 70.  Note, recent labs showed normal LFTs.  6. History of cerebral vascular disease.  He will need followup      carotid Dopplers in 6 months.  7. History of macrocytosis.  Being followed by Dr. Eloise [name].   I will see him back in 9 months, or sooner if necessary.     [name] Frieze Jens Som, MD, [facility]  Electronically Signed    BSC/MedQ  DD: 02/03/2007  DT: 02/03/2007  Job #: [id number]   cc:   [name] [name]. [name] [name], M.D.

## 2010-11-13 NOTE — Letter (Signed)
July 01, 2007    [name] [name]. [name] Som, MD, [facility]  [address]  [address]  [place], [place] [address]   RE:  [name] [name]  MRN:  [id number]  /  DOB:  June 06, 1924   Dear [name] [name]:   It was a pleasure to meet your patient, [name] [name], in consultation  today.   As you know he is an 77 year old gentleman who underwent pacemaker  implantation in the beginning part of October.  He presented to the  emergency room at [facility] with loss of responsiveness and  consciousness.  He was actually transferred down to [facility] and he woke  up about 36 hours later, and 48 hours later underwent pacemaker  implantation.  He has a history of ischemic heart disease but his  ejection fraction was near normal.  He currently underwent  catheterization but these results are not available.  Curiously, both at  [name] [name] and at [facility] his family was told that his prognosis was quite  poor and there were also told that maybe the pacemaker would solve it  and maybe it would not.  The reasons for these concerns are not clear to  me.   In any case he has had no recurrent syncope since his pacemaker was  implanted.   His past medical history is notable for diastolic heart failure,  hypertension, dyslipidemia, and macrocytosis as well as cerebrovascular  disease.   Other history showed GI bleeding.   His syncope had actually been longer standing in that I see now that he  had seen Dr. Ladona Ridgel in consultation in June of '07.   His family history is noncontributory.   Social history:  He lives in [place] with his wife.   His past surgical history is notable for cholecystectomy, herniorrhaphy,  knee replacement, knee surgery.   His review of systems in addition to the above is notable for prostatic  hypertrophy, he is status post TURP, a history of DVT, and pulmonary  embolism following an ankle fracture.   MEDICATIONS INCLUDE:  Lisinopril 20, finasteride 5, Omeprazole 20,  aspirin, Ezetimibe 5.   He  has no known drug allergies.   ON EXAMINATION TODAY:  His blood pressure was 128/62, his pulse was 62,  his weight was 191.  His HEENT exam demonstrated no  xanthoma.  The neck  veins were flat, the carotids were brisk and full bilaterally without  bruits.  The back was without kyphosis or scoliosis.  Lungs were clear.  The pacemaker pocket was well-healed.  The heart  sounds were regular with a murmur at the apex.  The abdomen was soft  with active bowel sounds without midline pulsation or hepatomegaly.  Femoral pulses were 2+, distal pulses were intact. There was no  clubbing, cyanosis or edema.  Neurological evaluation was grossly  normal. The skin was warm and dry.   INTERROGATION OF HIS MEDTRONIC ADAPTOR PULSE GENERATOR:  Demonstrates  that he is atrial pacing 57% of the time, he is ventricularly pacing 29%  of the time, his P wave was 2.8 with an impedance of 557 and threshold  of 0.75 and 24, the R wave was 11.2 with impedance of 511, threshold of  0.5 and 0.4, battery voltage was 2.78.   IMPRESSION:  1. History of syncope and - recurrent syncope.  2. Antecedent history of first degree AV block and right bundle branch      block.  3. Cardiac arrest, question mechanism, presumably bradycardic status  post pacemaker implantation without recurrence.  4. Moderate cardiomyopathy with an ejection fraction between 45 and      65% with moderate mitral regurgitation status post recent      catheterization, results of which are pending from [facility].  5. Hypertension.   [name] is status post pacemaker implantation for presumed  bradycardia and associated syncope and cardiac arrest.  We await the  records from [facility] as well as [facility] which I have requested  today.   As relates to his pacemaker dysfunction I have reprogrammed it to the A-  E mode to minimize ventricular pacing.  Rate response has also been  activated.   We will see him again in October.  He is to  see you next month.  Please  let us know if there is anything we can do in the interim.    Sincerely,      Duke Salvia, MD, [facility]  Electronically Signed    SCK/MedQ  DD: 07/01/2007  DT: 07/01/2007  Job #: [id number]

## 2010-11-13 NOTE — H&P (Signed)
NAME:  [name] [name], [name] [name] NO.:  [id number]   MEDICAL RECORD NO.:  [id number]          PATIENT TYPE:  EMS   LOCATION:  MAJO                         FACILITY:  [facility]   PHYSICIAN:  Gwen Pounds, MD       DATE OF BIRTH:  [birth year]   DATE OF ADMISSION:  08/16/2007  DATE OF DISCHARGE:                              HISTORY & PHYSICAL   PRIMARY CARE Alyene Predmore:  [name], M.D.   CARDIOLOGIST:  Madolyn Frieze. Jens Som, MD, [facility].   GASTROENTEROLOGIST:  Everardo All. Madilyn Fireman, M.D.   ORTHOPEDIC SURGEON:  Marlowe Kays, M.D.   CHIEF COMPLAINT:  Pancreatitis.   HISTORY OF PRESENT ILLNESS:  An 77 year old male with multiple medical  problems but in usual state of health when he woke up this morning at  6:30 in the morning.  At that point he just did not feel very well.  He  developed symptoms of right upper quadrant epigastric pains and atypical  xiphoid pain.  The pain was described as pressure and indigestion.  It  got worse and included nausea without vomiting, without diarrhea.  When  the pain got even worse, he came to the ED for evaluation and treatment.  In the ED, he was given Reglan, morphine, and had labs with abdominal CT  compatible with acute pancreatitis.  After the CT scan he developed  typical chest pain for which 2 nitroglycerin relieved the pain after 2  minutes.  He is now resting comfortably with markedly less pain.  I was  called to admit and he is getting IV fluids at the current time.   PAST MEDICAL HISTORY:  1. Osteoarthritis.  2. Gout.  3. Hypertension.  4. History of coronary disease, status post MI.  5. GERD.  6. Cardiomyopathy with congestive heart failure and diastolic      dysfunction.  7. History of GI bleed.  8. History of pacemaker.  9. Kidney stone.  10.Peptic ulcer disease.  11.Cholecystectomy in July 1997.  12.Right total knee replacement in 2000.  13.BPH.  14.Macular degeneration of the right eye, near blind.  15.Hyperlipidemia.  16.History of cerebrovascular disease.   ALLERGIES:  No known drug allergies.   MEDICATION LIST:  1. Lasix 20 mg one half tablet daily.  2. Klor-Con 20 mEq one half p.o. daily.  3. Allopurinol 100 daily.  4. Omeprazole 20 daily.  5. Salsalate 500 b.i.d.  6. Lisinopril 40 one half daily.  7. Finasteride 5 daily.  8. Caltrate 600 plus D b.i.d.  9. Ocuvite with Lutein b.i.d.  10.Aspirin 81 daily.  11.Zetia 10 daily.  12.Toprol XL 25 daily.  13.Vytorin 10/80 daily question.   SOCIAL HISTORY:  No alcohol.  He is married for 20 years.  He has 2  children and 3 grandchildren.  No tobacco use.  He is retired.   FAMILY HISTORY:  Father died at the age of 65 of leukemia.  Mother  died  at the age of 69 of diabetes and old age.   REVIEW OF SYSTEMS:  At this point the only symptom is mild anorexia.  No  nausea, no vomiting.  Otherwise negative.  Recent increasing symptoms of  GERD and dyspepsia.   PHYSICAL EXAMINATION:  VITAL SIGNS:  Temperature 97, heart rate 51,  blood pressure 120/67, respiratory rate 20, sating 98% on room air.  GENERAL:  Alert and oriented.  PULMONARY:  Clear to auscultation bilaterally.  CARDIAC:  Regular with a left pacemaker in place.  HEENT:  Oropharynx is dry.  NECK:  No JVD.  ABDOMEN:  Bloated.  Laparoscopic surgical scars noted.  EXTREMITIES:  No edema.   ANCILLARY DATA:  White count is 7, hemoglobin 15.2, platelet count 154.  Urinalysis negative.  CK and troponin I are negative at 1638.  Sodium  139, potassium 4.2, chloride 101, bicarb 31, BUN 19, creatinine 1.16,  glucose 87.  Occult blood is negative.  LFTs are within normal limits.  Lipase 281 upper limit of normal at 59.  EKG at 6:53 shows right bundle  branch block, normal sinus rhythm.  Abdominal CT shows mild pancreatitis  in the head of the pancreas.   ASSESSMENT:  This is an elderly male with multiple medical problems  status post gallbladder removal being admitted with acute pancreatitis   and 2 minutes of typical chest pain.   PLAN:  1. Admit.  2. Rule out MI with 2 minutes of chest pain.  May need Dr. Jens Som as      needed.  EKG in the morning.  CK and troponin to rule out MI.  Add      Imdur.  Dr. [name].  Restart the Lisinopril, Lasix, and Toprol      tomorrow or Wednesday as able, depending on current symptomatology.  3. Pancreatitis.  The patient is not a drinker.  We will check a      triglyceride level.  This should be sent from the ER.  He may need      an EGD and ERCP.  Dr. Madilyn Fireman' group was called and Dr. Randa Evens was      made aware of this admission.  They do not need to see him tonight.      They can certainly see him tomorrow morning unless something      changes.  We are going to continue IV fluids and work on symptom      relief.  We are going to watch for congestive heart failure with      the fluids and we cannot to an MRCP due to the pacemaker.  4. There is no current congestive heart failure or symptomatology.  5. Lovenox for DVT prophylaxis, may need to switch to acute coronary      syndrome dose if felt that his chest pain was related to coronary      symptoms.  6. Pacemaker is in place and this has been thoroughly reviewed by      cardiology in the last few visits.  7. Cerebrovascular disease.  Cardiology is planning on doing carotid      Dopplers in June.  8. Restart home medications as able.  9. He will get IV proton pump inhibition until he is better.  10.Follow labs.      Gwen Pounds, MD  Electronically Signed     JMR/MEDQ  D:  08/16/2007  T:  08/17/2007  Job:  [id number]   cc:   [name], M.D.  Madolyn Frieze Jens Som, MD, [facility]  John C. Madilyn Fireman, M.D.  Marlowe Kays, M.D.

## 2010-11-13 NOTE — Assessment & Plan Note (Signed)
[facility]                            CARDIOLOGY OFFICE NOTE   NAME:[name] [name], [name] [name]                      MRN:          [id number]  DATE:06/09/2007                            DOB:          [birth year]    Mr. Cookston is a gentleman who I have seen in the past for congestive  heart failure felt secondary to diastolic dysfunction, hypertension,  hyperlipidemia and cerebrovascular disease.  Since I last saw him he has  had a complicated course by his report.  In October he apparently  stopped breathing at home.  He was taken to [facility] and en  route apparently required defibrillation.  I have none of those records  available.  He was ultimately transferred to [facility] because  there were no beds available at [facility].  He had a temporary pacemaker  placed and ultimately permanent pacemaker.  He also underwent cardiac  catheterization but no intervention apparently was deemed necessary.  Again, I have none of those records available.  Since then he has not  had chest pain, shortness of breath, palpitations or syncope.   MEDICATIONS:  1. Lisinopril 20 mg p.o. daily.  2. Finasteride 5 mg p.o. daily.  3. Omeprazole 20 mg p.o. daily.  4. Salicylate.  5. Ocuvite.  6. Allopurinol.  7. Aspirin 81 mg p.o. daily.  8. Lasix 10 mg p.o. daily.  9. Potassium 10 mEq p.o. daily.  10.Zetia 5 mg p.o. daily.  11.Caltrate.   PHYSICAL EXAMINATION:  Shows a blood pressure of 157/78 and his pulse is  59.  HEENT:  Normal.  NECK:  Supple.  CHEST:  Clear.  CARDIOVASCULAR:  Reveals a regular rate and rhythm.  ABDOMINAL:  Shows no tenderness.  EXTREMITIES:  Show no edema.   Electrocardiogram shows AV pacing.   DIAGNOSES:  1. Status post recent cardiac arrest and pacemaker placement -- We      need to obtain all these records from [facility] in [place]      concerning what happened to Mr. Derousse.  2. History of coronary disease -- He will  continue on his angiotensin      converting enzyme inhibitor and aspirin.  He is not on a statin and      I will review his records from [facility] prior to reinitiating this.      He is also not on a beta blocker secondary to his history of      borderline heart rate and conduction abnormalities.  We will add      this in the future after I review his records.  3. History of congestive heart failure secondary to diastolic      dysfunction -- He will continue on his diuretics and potassium and      I will check BMET today.  4. Hypertension -- His blood pressure is mildly elevated.  We will      most likely add a beta blocker in the near future after I review      his records from [facility].  5. Hyperlipidemia -- He will continue on his Zetia for now.  We will      most likely add a statin in the near future unless there is a      contraindication based on his outside records.  6. History of mild cerebrovascular disease -- He will need followup      carotid Dopplers.  7. History of macrocytosis.   I will see him back in 6 weeks.     Madolyn Frieze Jens Som, MD, [facility]  Electronically Signed    BSC/MedQ  DD: 06/09/2007  DT: 06/09/2007  Job #: [id number]   cc:   [name], M.D.

## 2010-11-13 NOTE — Letter (Signed)
March 29, 2008    [name], M.D.  [address]  [place], [place] [address]   RE:  [name], [name]  MRN:  [id number]  /  DOB:  Dec 07, 1923   Dear Jesusita Oka:   Mr. Abril came in today for pacemaker generator followup.  He is doing  quite well at this time without recurrent problems.  His energy levels  are good.  He is having no shortness of breath.   He does have swelling in his feet bilaterally.   We reviewed his diet.  It is quite salt and fluid replete.  Ham  sandwiches or chicken sandwiches are nothing for lunch and 4-5 glasses,  16 ounces or greater of tea are part of his daily intake.   Attempts in the past to up titrate his diuretic has been associated with  hypotension.   His medications include metoprolol 25 b.i.d., lisinopril 20 mg a day,  finasteride 5 that might be affecting his blood pressure.  He is also on  the furosemide 10, and potassium 10.   On examination today, his blood pressure was 145/84 with the pulse of  75, his weight was 192 which is stable.  His lungs were clear.  His neck  veins were flat.  His heart sounds were regular, and extremities had 1  to 2+ edema.   Interrogation of his Medtronic adaptive pulse generator demonstrates a P-  wave of 4 and impedence of 524, threshold of 1 volt at 0.4, the R-wave  was 11, impedance of 540, threshold of 0.75 at 0.4.  Battery voltage  2.78, atrial pacing was 96% of the time, up from 57% about a year ago.   IMPRESSION:  1. Sinus node dysfunction with progressive device utilization.  2. Peripheral edema.  3. Hypertension.  4. Congestive heart failure - chronic - diastolic.  5. Previous cerebrovascular accident.   Jesusita Oka, Mr. Hyland pacemaker is doing fine.  The issue on the table was  his edema.  We discussed trying to decrease fluid intake, as well as  salt intake and to see how that works.  In the event if that does not  satisfy up titration of his Lasix and down titration of his lisinopril,  might  be the next best step by told at that point to contact you and you  will have more expertise on closer followup him than I could.    Sincerely,      Duke Salvia, MD, [facility]  Electronically Signed    SCK/MedQ  DD: 03/29/2008  DT: 03/30/2008  Job #: [id number]

## 2010-11-13 NOTE — Assessment & Plan Note (Signed)
HEALTHCARE                            CARDIOLOGY OFFICE NOTE   NAME:Caponigro, JAHMEEK SHIRK                      MRN:          [id number]  DATE:12/30/2007                            DOB:          May 29, 1924    Mr. Sites is a very pleasant gentleman who has a history of congestive  heart failure, felt secondary to diastolic dysfunction; hypertension;  hyperlipidemia; mild coronary artery disease; and cerebrovascular  disease.  He also has had a previous pacemaker placed in December 2008  following a cardiac arrest at [facility].  This was placed at  [facility] and apparently was bradycardia mediated, but none of  those records have been available.  His most recent cardiac  catheterization here was in April 2005.  At that time, he had  nonobstructive coronary artery disease, but he did have elevated right  and left filling pressures.  He has been treated medically.  His most  recent echocardiogram here in May 2007 showed normal LV function, mild  aortic insufficiency, and mild mitral regurgitation.  He did have  dilated right ventricle.  Since I last saw him, he apparently has been  admitted to the hospital back in February 2009 secondary to a bout of  idiopathic pancreatitis.  He states that he has had some dyspnea, felt  secondary to deconditioning and diastolic heart failure.  He has mild  pedal edema.  He has not had chest pain or syncope.   MEDICATIONS:  1. Allopurinol 300 mg p.o. daily.  2. Zetia 10 mg p.o. daily.  3. Metoprolol 12.5 mg p.o. b.i.d.  4. Polyethylene.  5. Lisinopril 20 mg p.o. daily.  6. Finasteride 5 mg p.o. daily.  7. Omeprazole.  8. Salsalate.  9. Ocuvite.  10.Aspirin 81 mg p.o. daily.  11.Lasix 10 mg p.o. daily.  12.Potassium 10 mEq p.o. daily.   PHYSICAL EXAMINATION:  VITAL SIGNS:  Today, blood pressure of 131/78 and  his pulse is 67.  HEENT:  Normal.  NECK:  Supple.  CHEST:  Clear.  CARDIOVASCULAR:  Regular  rate and rhythm.  ABDOMINAL:  No tenderness.  EXTREMITIES:  Trace to 1+ ankle edema.   His electrocardiogram shows atrial pacing with a first-degree AV block.  There is a right bundle-branch block.   DIAGNOSES:  1. Status post cardiac arrest and pacemaker placement at [facility] - we are continuing to await those medical records.  He      will continue to be followed in [facility].  2. History of coronary artery disease - he will continue on his      aspirin, ACE inhibitor, and statin and we will increase his beta-      blocker to 25 mg p.o. b.i.d.  3. History of congestive heart failure, felt secondary to diastolic      dysfunction - he will continue on his diuretics and potassium.  We      will plan to check a BMET in 6 weeks.  4. Hypertension - his blood pressure is adequately controlled.  5. Hyperlipidemia - he is only on  Zetia and given his cerebrovascular      disease and coronary artery disease, I will add Zocor 40 mg p.o.      daily.  We will check lipids and liver in 6 weeks and adjust as      indicated.  6. Cerebrovascular disease - he had followup carotid Dopplers today      and he has a 60-79% left and 40-59% right.  He will need followup      Dopplers in 6 months.  7. History of macrocytosis.   We will see him back in 6 months.  I have recommended that he be in  cardiac rehab secondary to dyspnea and mild diastolic dysfunction.     Madolyn Frieze Jens Som, MD, [facility]  Electronically Signed    BSC/MedQ  DD: 12/30/2007  DT: 12/31/2007  Job #: [id number]   cc:   [name], M.D.

## 2010-11-16 NOTE — Op Note (Signed)
[place]  Patient:    [name] [name], [name] [name] Visit Number: [id number] MRN: [id number]          Service Type: DSU Location: [facility] 2852 01 Attending Physician:  Tommy Medal Dictated by:   Doris Cheadle. Dione Booze, M.D. Proc. Date: 11/04/01 Admit Date:  11/04/2001 Discharge Date: 11/04/2001   CC:         [name], M.D.   Operative Report  INDICATIONS AND JUSTIFICATIONS FOR THE PROCEDURE:  [name] is a gentleman followed in my office since 1990.  He had his right cataract removed in 1994 and did well with return of 20/20 vision and later had laser capsulotomy.  His left eye was done in 1998 and he again had a return of good vision.  He  had a laser capsulotomy on the left in the year 2000.  He was seen most recently on October 12, 2001 with moderately severe blepharochalasis complaining that his eyelids were drooping and he wanted to have surgery for that reason.  This was discussed with him and he was told he could possibly have bad hemorrhage or he could lose vision and that the eyes may look asymmetrical but I felt he was a good candidate for the procedure since the skin was covering his eyelashes and actually came to within about 1 mm of each pupil.  He felt this blocked his upper field of vision and it caused fatigue. He decided to have upper eyelid optical blepharoplasties to alleviate these symptoms.  The margin reflex distance is 1 mm in each eye.  Medically, he should be stable with respect to having upper eyelid optical blepharoplasties. He is followed medically by [name], M.D.  JUSTIFICATION FOR PERFORMING PROCEDURE IN OUTPATIENT SETTING:  Routine.  JUSTIFICATION FOR OVERNIGHT STAY:  None.  PREOPERATIVE DIAGNOSIS:  Severe blepharochalasis with visual impairment.  POSTOPERATIVE DIAGNOSIS:  Severe blepharochalasis with visual impairment.  OPERATION PERFORMED:  Upper eyelid optical  blepharoplasty.  SURGEON:  Robert L. Dione Booze, M.D.  ANESTHESIA:  1% Xylocaine.  DESCRIPTION OF PROCEDURE:  The patient arrived in the minor surgery room at [facility] and was prepped and draped in the routine fashion.  A frontal nerve block was given and some additional 1% Xylocaine in each upper lid was given.  The skin to be removed was carefully demarcated and excised along  with some underlying fatty tissue and bleeding was controlled with cautery.  Each wound was closed with a running 6-0 nylon suture and pressure patches were applied.  The patient left the room having done well.  FOLLOW-UP:  The patient will be seen in my office in five days to have the sutures removed.  He is to remove the patches at 5 oclock this afternoon and is to apply pressure if there is bleeding.  He is to use warm compresses twice daily and is to use Polysporin ointment in his eyes at night. Dictated by:   Doris Cheadle. Dione Booze, M.D. Attending Physician:  Tommy Medal DD:  11/04/01 TD:  11/05/01 Job: [id number]

## 2010-11-16 NOTE — Op Note (Signed)
[facility]  Patient:    [name], [name] Visit Number: [id number] MRN: [id number]          Service Type: SUR Location: 3W 3086 01 Attending Physician:  Evlyn Clines Dictated by:   Excell Seltzer. Annabell Howells, M.D. Proc. Date: 12/24/01 Admit Date:  12/24/2001   CC:         [name], M.D.   Operative Report  PROCEDURE:  Transurethral resection of the prostate.  PREOPERATIVE DIAGNOSIS:  Benign prostatic hypertrophy.  POSTOPERATIVE DIAGNOSIS:  Benign prostatic hypertrophy.  SURGEON:  Excell Seltzer. Annabell Howells, M.D.  ANESTHESIA:  Spinal.  COMPLICATIONS:  None.  INDICATIONS:  [name] is a 77 year old white male, with BPH and outlet obstruction, who has elected to undergo TURP for treatment of his obstructive voiding symptoms.  He has been on alpha blockers and failed that therapy.  FINDINGS AT PROCEDURE:  The patient was given p.o. Tequin, taken to the operating room where a spinal anesthetic was induced.  He was placed in lithotomy position.  Perineum and  genitalia were prepped with Betadine solution and draped in the usual sterile fashion.  The urethra was calibrated to 30 [place] with [employer] sounds.  A 28 French continuous-flow resectoscope sheath was inserted.  This was fitted with an [place] handle, a 12 degree lens, 26 loop, and was set on blend 1 for the cautery.  Examination revealed a short prostate with a tight bladder neck.  The ureteral orifices were well away from the bladder neck, and there was mild trabeculation, but no other abnormalities were noted.  The prostate was then resected, beginning with the bladder neck from 5 to 7 oclock down to the fibers.  The floor of the prostate was resected out to the verumontanum.  The left lateral lobe of the prostate was resected from bladder neck to apex.  This was followed by the right lateral lobe.  Hemostasis achieved at this point, and some chips were removed.  I then resected some  residual apical tissue as well as some residual lateral tissue.  Once this had been resected and removed, final hemostasis was achieved.  Inspection revealed no retained chips or active bleeding.  The ureteral orifices were unremarkable.  The external sphincter was intact.  The scope was removed.  Pressure on the bladder produced an excellent stream.  A 22 French three-way Foley catheter was inserted with the aid of a catheter guide without difficulty.  The wound was filled with 30 cc of sterile fluid. The catheter was then held on traction and hand-irrigated until clear.  It was then placed to continuous irrigation and straight drainage.  The patient was taken down from lithotomy position and moved to the recovery room in stable condition, having no complications. Dictated by:   Excell Seltzer. Annabell Howells, M.D. Attending Physician:  Evlyn Clines DD:  12/24/01 TD:  12/26/01 Job: [id number]

## 2010-11-16 NOTE — Cardiovascular Report (Signed)
[place]. [facility]  Patient:    [name] [name], [name] [name]                      MRN: [id number] Proc. Date: 05/18/00 Adm. Date:  [id number] Attending:  Talitha Givens CC:         Madolyn Frieze. Jens Som, M.D. [facility]  Ivery Quale, M.D.  Luis Abed, M.D. [facility]   Cardiac Catheterization  PROCEDURES PERFORMED: 1. Left heart catheterization. 2. Left ventriculogram. 3. Selective coronary angiography. 4. Aortic root angiogram. 5. Perclose right femoral artery.  DIAGNOSES: 1. Mild coronary artery disease by angiogram. 2. Normal left ventricular systolic dysfunction. 3. Left ventricular diastolic dysfunction. 4. Normal aortic root.  HISTORY:  [name] [name] is a 77 year old white male without prior cardiac history who presents with substernal chest discomfort.  Despite aggressive medical management, the patient has persistent chest pain and nonspecific ECG changes. An echocardiogram in the emergency room suggested dysfunction of the posterior wall.  He presents now for left heart catheterization.  TECHNIQUE:  After informed consent was obtained, the patient was brought to the cardiac catheterization laboratory where both groins were sterilely prepped and draped.  One percent lidocaine was used to infiltrate the right groin and a 6-French sheath was placed in the right femoral artery using the modified Seldinger technique.  Six [place] JL-4 and JR-4 catheters were then used to engage the left and right coronary arteries and selective angiography was performed in various projections using manual injections of contrast.  The 6-French pigtail catheter was advanced into the left ventricle and left ventriculogram was performed using power injections of contrast.  The pigtail catheter was brought back into the ascending aorta and an aortic room angiogram was performed using power injections of contrast.  At the termination of the case, the catheters and sheath were  removed and a Perclose suture closure device was deployed to the right femoral artery until adequate hemostasis was achieved.  The patient tolerated the procedure well and was transferred to the floor in stable condition.  FINDINGS: 1.  Left main trunk:  Medium caliber vessel with mild irregularities. 2.  Left anterior descending artery:  This is a medium caliber vessel that     provides a first diagonal branch in the proximal segment.  The LAD and     diagonal branch have mild irregularities. 3.  Left circumflex artery:  This is a medium caliber vessel that provides     four marginal branches.  The small first marginal branch has an ostial     narrowing of approximately 50%.  The remainder of the circumflex has mild     irregularities. 4.  Right coronary artery:  Dominant.  It is a large caliber vessel that     provides a posterior descending artery and three small posteroventricular     branches in its terminal segment.  The right coronary artery has mild     irregularities in the proximal and mid section, with narrowings of 20% to     30%. 5.  Aortic root:  Normal caliber, with no evidence of dissection. 6.  LV:  Normal end-systolic and end-diastolic dimensions.  Overall left     ventricular function is well preserved.  Ejection fraction is greater than     65%.  No mitral regurgitation.  LV pressure is 105/15, aortic 105/64, and     LV EDP equals 25.  ASSESSMENT AND PLAN:  [name] [name] is a 77 year old gentleman with mild  coronary artery disease by angiogram and normal left ventricular systolic function.  He does have elevated filling pressures, suggestive of diastolic dysfunction.  At this point, continued risk factor modification of his coronary disease will be pursued, other causes of chest discomfort investigated and treatment initiated for diastolic dysfunction. DD:  05/18/00 TD:  05/18/00 Job: [id number]

## 2010-11-16 NOTE — Op Note (Signed)
[place]. [facility]  Patient:    [name] [name], [name] [name]                      MRN: [id number] Proc. Date: 09/05/00 Adm. Date:  [id number] Disc. Date: [id number] Attending:  Stephenie Acres                           Operative Report  PREOPERATIVE DIAGNOSIS:  Left inguinal hernia.  POSTOPERATIVE DIAGNOSIS:  Left inguinal hernia.  OPERATION PERFORMED:  Left inguinal hernia repair with mesh.  SURGEON:  Stephenie Acres, M.D.  ANESTHESIA:  General.  DESCRIPTION OF PROCEDURE:  The patient was taken to the operating room and placed in supine position.  After adequate anesthesia was induced using endotracheal tube, the left groin and perineum were prepped and draped in normal sterile fashion.  Using an oblique incision overlying the inguinal canal, I dissected down to the external oblique fascia.  This was opened along its fibers.  There was an enormous hernia sac which was encountered upon opening the external oblique fascia.  The ilioinguinal nerve was stretched very tightly and could not be salvaged.  It was divided and suture ligated. Because of the very large size of the hernia sac, the cord was difficult to isolate but it was isolated near the pubic tubercle.  The dissection again was very difficult to take this off the very large hernia sac.  It was obvious there was a large amount of sigmoid colon within the hernia sac.  This all appeared to be an indirect hernia.  It was reduced within the internal ring and kept in place using a sponge stick.  Both the inguinal ligament and the transversalis fascia were identified.  Using a Prolene mesh system of the large size, the preperitoneal space was dissected bluntly with my finger. This was accomplished all the way down to the pubic tubercle and out to the anterior superior iliac spine.  The base of the mesh system was then placed in the preperitoneal space.  This reduced the hernia sac completely.  The  onlay mesh was then sutured starting at the pubic tubercle of the transversalis fascia lateral to the internal ring.  A small slit was made in the onlay mesh to allow the spermatic cord.  It was also sutured along the inguinal ligament. The double arm Prolene suture was then tied to itself laterally abouit 4 or 5 cm lateral to the internal ring.  On further inspection of the spermatic cord, the vas deferens was very tented and was difficult to salvage.  All tissues were injected using 0.5% Marcaine.  I was satisfied with the hernia repair, particularly with the onlay mesh and therefore closed the external oblique fascia with a running 3-0 Vicryl suture.  Skin and subcutaneous tissues were all injected using 0.5% Marcaine solution.  Skin was closed with staples and a sterile dressing was applied.  The patient tolerated the procedure well and went to PACU in good condition. DD:  09/05/00 TD:  09/08/00 Job: [id number]

## 2010-11-16 NOTE — Op Note (Signed)
[facility]  Patient:    [name] [name], [name] [name] Visit Number: [id number] MRN: [id number]          Service Type: DSU Location: DAY Attending Physician:  Marlowe Kays Page Proc. Date: 05/04/01 Admit Date:  05/04/2001                             Operative Report  PREOPERATIVE DIAGNOSIS:  Torn medial meniscus, left knee.  POSTOPERATIVE DIAGNOSES: 1. Torn medial and lateral menisci. 2. 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  OPERATION: 1. Left knee arthroscopy with one partial medial and lateral meniscectomy. 2. Shaving of medial femoral condyle.  SURGEON:  Illene Labrador. [place], M.D.  ASSISTANT:  Nurse.  ANESTHESIA:  General.  PATHOLOGY AND JUSTIFICATION OF PROCEDURE:  He has had a right total knee replacement.  He has had pain in the left knee, ill-defined since around late August early September of this year.  Plain x-rays have demonstrated mild degenerative changes, but MRI has demonstrated degenerative type tears involving the posterior horn of the medial meniscus.  Because of this, he is here for the above-mentioned surgery.  Other pathologic items of note, he has a history of pulmonary emboli, and we did not use a pneumatic tourniquet, and postoperatively we are going to place him on aspirin.  He also was given prophylactic antibiotics because of his right total knee replacement.  DESCRIPTION OF PROCEDURE:  Satisfactory general anesthesia, thigh stabilizer. Left knee was prepped with DuraPrep and draped in a sterile field.  The anatomy of the knee was marked out.  Superior and medial saline inflow.  First through an anterolateral portal, the medial compartment of knee joint was evaluated.  He had a tear of the anterior portion of the medial meniscus superficially, but the major tear was involving the entire posterior third. Pictures were taken.  The medial femoral condyle pathology was also pictured. I shaved this down  until smooth with the 3.5 shaver.  The meniscus was debrided back to a stable structure posteriorly and over the anterior weightbearing surface with small baskets and then shaved down until smooth with the 3.5 shaver with final pictures being taken.  His ACL was intact.  I was unable to get around the corner of the medial femoral condyle through this portal.  On switching portals, he had a good bit of synovitis laterally which I resected.  The lateral meniscus had a good bit of fraying throughout its middle third, and this was pictured and shaved down until smooth with the 3.5 shaver.  The joint surfaces looked relatively benign.  I then looked up the lateral gutter and suprapatellar area, and his patella surface looked unremarkable.  The knee joint was then irrigated until clear and all fluid possible removed.  The two anterior portals were closed with 4-0 nylon.  Then 20 cc of 0.5% Marcaine with adrenalin, 4 mg of morphine were then instilled through the inflow apparatus which was removed and this portal closed with 4-0 nylon as well.  Betadine, Adaptic dry sterile dressing were applied.  He tolerated the procedure well and was taken to the recovery room in satisfactory condition with no known complications. Attending Physician:  Joaquin Courts DD:  05/04/01 TD:  05/05/01 Job: [id number]

## 2010-11-16 NOTE — Discharge Summary (Signed)
NAME:  [name] [name], [name] [name] NO.:  [id number]   MEDICAL RECORD NO.:  [id number]          PATIENT TYPE:  INP   LOCATION:  2037                         FACILITY:  [facility]   PHYSICIAN:  Maple Mirza, P.A. DATE OF BIRTH:  December 09, 1923   DATE OF ADMISSION:  11/09/2005  DATE OF DISCHARGE:  11/12/2005                                 DISCHARGE SUMMARY   This patient has no known drug allergies and this discharge 20 minutes.   PRINCIPAL DIAGNOSES:  1.  Admitted with syncope.  2.  Negative orthostatic blood pressure measurements.  3.  Troponin I studies are negative.  4.  Left heart catheterization, Nov 11, 2005, mild coronary artery plaque.  5.  Echocardiogram, Nov 11, 2005, ejection fraction 60%, no left ventricular      wall motion abnormalities.  6.  Electrocardiogram demonstrating first-degree AV block, right bundle      branch block.  7.  Head trauma with a fall:  CT of the head is negative for acute      intracranial abnormality, no fracture.  8.  Right hand trauma:  X-ray of the right hand showing no fracture, no      dislocation.   SECONDARY DIAGNOSES:  1.  Class II congestive heart failure/diastolic dysfunction.  2.  History of peptic ulcer disease, hospitalized in 1986 with      gastrointestinal bleed requiring transfusion.  3.  Gastroesophageal reflux disease.  4.  Nephrolithiasis.  5.  Dyslipidemia.  6.  Gout.  7.  Benign prostatic hypertrophy status post transurethral resection of the      prostate.  8.  History of deep venous thrombosis and pulmonary embolism at the time of      ankle fracture.  9.  Status post total knee arthroplasty.   PROCEDURES:  1.  Nov 11, 2005, left heart catheterization:  The left main is normal, the      LAD is normal and the diagonal was large and normal.  Left circumflex      had a proximal 25% stenosis in the AV groove, otherwise luminal      irregularities, obtuse marginal 1 through 3 are normal.  Right coronary  artery is large and dominant with proximal irregular 25% stenosis.  The      PDA is large and normal.  The posterolateral branch is large and normal.      No left ventricular gram was obtained.  2.  Echocardiogram, Nov 11, 2005:  Ejection fraction was 60%.  No left      ventricular wall motion abnormalities.   PLAN:  1.  The patient discharged on May 15 with 30-day Holter monitor.  2.  Appointment with Dr. Olga Millers, Tuesday, May 29 at 11:30.  3.  He will see Dr. Lewayne Bunting, Wednesday 6 at 12:30.  He is asked not to      drive until sees Dr. Ladona Ridgel.   BRIEF HISTORY:  [name] [name] is a 77 year old male who has no prior history of  coronary artery disease.  He is seen by Dr. Jens Som, for diastolic  dysfunction and moderate pulmonary  hypertension.  An echocardiogram was  obtained April 2006, which demonstrated an ejection fraction of 45%.   [name] [name] has a habit of walking almost every other day.  On the morning of  May 12th, he went out for his morning walk.  His wife said that he had been  gone 10 minute before she was contacted by emergency medical services.  The  patient says that he had been in his usual state of health that morning.  He  had eaten breakfast 2 hours prior to the walk.  He had dyspnea or chest  discomfort, but suddenly felt swimmy-headed.  The next thing he knows on  recounting is that he awoke to the administration of the emergency medical  services.  Apparently, the occupant of the house he was walking by, a person  who knew him, stop and called 911 and also mentioned  by emergency medical  services the patient was then heart block with bradycardia, but the records  of this is not available.  The patient would be placed on telemetry.  Electrocardiogram taken in the emergency room shows first-degree AV block  and right bundle branch block.   The patient did incur a head trauma and a right hand trauma, and a CT of the  head and x-rays of the right hand will be  obtained.  He will also have a  troponin I studies.  He will also have a V/Q scan to rule out pulmonary  embolism with his history of pulmonary embolism.   The patient does have no evidence of problems with his activities of daily  living.  He says it takes him about an hour and a half to mow his lawn and  he does this in one bout.  He says that Dr. Jens Som relates his heart  problems within normal limits, but the relaxation is a little bit abnormal.  This patient qualifies as class II congestive heart failure symptom.  The  plan will be to admit the patient to telemetry.  We will schedule him for a  left heart catheterization.  He will be seen in consultation by  electrophysiology.   HOSPITAL COURSE:  The patient was admitted through the emergency room with  syncope.  He underwent left heart catheterization on June 14.  The study  showed no hemodynamically significant coronary artery disease.  An  echocardiogram which was taken the same day showed ejection fraction to be  60% with no left ventricular wall motion abnormalities.  He was seen in  consultation by Dr. Lewayne Bunting, who recommended outpatient monitoring.  __________ as mentioned above, the patient had negative orthostatic blood  pressure measurements, a troponin I studies were negative.  We will see if  syncope recurs.  A possible loop recorder will be implanted if the etiology  of his syncope remains unclear.      Maple Mirza, P.A.     GM/MEDQ  D:  12/24/2005  T:  12/24/2005  Job:  [id number]   cc:   [name], M.D.  Fax: [phone]   Olga Millers, M.D. [facility]  [address]  [address]  [place]  [place] [address]

## 2010-11-16 NOTE — Op Note (Signed)
NAME:  [name] [name], [name] [name]               ACCOUNT NO.:  [id number]   MEDICAL RECORD NO.:  [id number]          PATIENT TYPE:  AMB   LOCATION:  ENDO                         FACILITY:  [facility]   PHYSICIAN:  John C. Madilyn Fireman, M.D.    DATE OF BIRTH:  27-Apr-1924   DATE OF PROCEDURE:  03/20/2004  DATE OF DISCHARGE:                                 OPERATIVE REPORT   PROCEDURE:  Colonoscopy with polypectomy.   INDICATIONS FOR PROCEDURE:  History of adenomatous colon polyps.   PROCEDURE:  The patient was placed in the left lateral decubitus position  and placed on the pulse monitor with continuous low flow oxygen delivered by  nasal cannula.  He was sedated with 100 mcg IV fentanyl and 7.5 mg IV  Versed.  The Olympus video colonoscope is inserted into the rectum and  advanced to the cecum, confirmed by transillumination at McBurney's point  and visualization of the ileocecal valve and appendiceal orifice.  Prep is  excellent.  The cecum and ascending colon appeared normal with no masses,  polyps, diverticula, or other mucosal abnormalities.  Within the transverse  colon, there was a 6 mm sessile polyp, which was fulgurated and hot  biopsied.  The remainder of the transverse, descending, and sigmoid colon  appeared normal.  Within the rectum, there was a 6 mm sessile polyp at  approximately 20 cm that was fulgurated by hot biopsy.  The remainder of the  rectum appeared normal.  The scope was then withdrawn, and the patient  returned to the recovery room in stable condition.  He tolerated the  procedure well, and there were no immediate complications.   IMPRESSION:  Two small colon polyps, otherwise normal study.   PLAN:  Will await histology and probably repeat colonoscopy in five years.      JCH/MEDQ  D:  03/20/2004  T:  03/20/2004  Job:  [id number]   cc:   [name], M.D.  [address]  [place]  [place] [address]  Fax: [phone]

## 2010-11-16 NOTE — H&P (Signed)
NAMEMarland Kitchen  [name], [name] NO.:  [id number]   MEDICAL RECORD NO.:  [id number]          PATIENT TYPE:  INP   LOCATION:  2037                         FACILITY:  [facility]   PHYSICIAN:  Doylene Canning. Ladona Ridgel, M.D.  DATE OF BIRTH:  27-Aug-1923   DATE OF ADMISSION:  11/09/2005  DATE OF DISCHARGE:                                HISTORY & PHYSICAL   PRIMARY CARE PHYSICIAN:  [name], M.D.   PRIMARY CARDIOLOGIST:  Olga Millers, M.D.   PRESENTING CIRCUMSTANCE:  I passed out.  I don't know what happened.   HISTORY OF PRESENT ILLNESS:  [name] [name] is a 77 year old male.  He  has no prior history of coronary artery disease.  He is seen by Dr. Jens Som  for diastolic dysfunction and moderate pulmonary hypertension.  He had an  echocardiogram in April, 2006 which demonstrated an ejection fraction of  45%.  He had an echocardiogram this admission on May 14th, which showed an  ejection fraction of 60% and no left ventricular wall motion abnormalities.   [name] [name] has a habit of walking almost every other day.  On the morning of  May 12th, he went out for his morning walk.  His wife said that he had been  gone about 10 minutes before she was contacted by emergency medical  services.  The patient himself says that he had been in his usual state of  health the morning of May 12th.  He had eaten breakfast two hours prior to  his walk.  He had no dyspnea or chest discomfort and suddenly felt swimmy-  headed.  The next thing he knows on recounting is that he awoke to the  administrations of emergency medical services.  Apparently the occupant of  the house he was walking by, a person who knew him and driving by, stopped  and called 911.  There was some mention by emergency medical services that  the patient was in a heart block with bradycardia, but records of this are  not available.  The patient has not had any severe bradycardia during the  three days prior to this consult  and has maintained sinus rhythm.  Electrocardiogram does show first-degree AV block and right bundle branch  block.   The patient recounts that he has never had a history of collapse or syncope  prior to this.  He does say that on very rare occasions, he does feel dizzy  when standing or changing position, going from a kneeling position, for  example, tying his shoes, to a standing position.  Sometimes he has to stop  and get his bearings and let the dizziness pass, but he has never felt that  if he were to pass out, he could not grab hold of something, for example.  In addition, he cannot recount anyone in his family who had a history of  sudden cardiac death or premature unexplained death.  The patient's Q-T on  electrocardiogram is 425 ms.  He did incur head trauma and right hand  trauma.  A CT of the head was negative for acute  intracranial abnormality  and no fracture.  X-ray of the right hand shows no acute fracture.  His  admission troponin I studies were negative x2.  He had a V/Q scan and ruled  out for pulmonary embolism.  He underwent a left heart catheterization on  Nov 11, 2005.  The study showed mild coronary plaque, nothing greater than  25% stenosis with the LAD, PDA, small obtuse marginals, and the diagonal  without any significant disease.   The patient does have evidence of any problem with activities of daily  living.  He says that it takes him about an hour and a half to mow his lawn,  and he does that in one bout.  He says that Dr. Jens Som says that his heart  pump function is within normal limits, but the relaxation is a bit abnormal.  The patient qualifies for class II congestive heart failure symptoms.   ALLERGIES:  Patient has no known drug allergies.   MEDICATIONS:  1.  Furosemide 10 mg daily.  2.  Potassium chloride 10 mEq daily.  3.  Enteric-coated aspirin 81 mg daily.  4.  Lisinopril 10 mg daily.  5.  Prilosec 20 mg daily.  6.  Allopurinol 100 mg  daily.  7.  Proscar 5 mg daily.  8.  Ocuvite with Lutein ophthalmic solution daily for macular degeneration.  9.  Salsalate 500 mg twice daily.   PAST MEDICAL HISTORY:  1.  Diastolic dysfunction/class II CHF.  2.  No significant disease on catheterization, Nov 11, 2005.  3.  Echocardiogram with ejection fraction of 60% in Nov 11, 2005.  No left      ventricular wall motion abnormalities.  Mild mitral regurgitation.  4.  History of peptic ulcer disease, hospitalized in 1986 with a GI bleed      requiring transfusion.  5.  GERD.  6.  Nephrolithiasis.  7.  Dyslipidemia.  8.  Gout.  9.  Benign prostatic hypertrophy, status post TURP.  10. History of deep venous thrombosis and pulmonary embolism in the setting      of ankle fracture.  11. Status post total knee arthroplasty.  12. Orthostatic blood pressure studies are negative for orthostatic      hypotension.   SOCIAL HISTORY:  The patient lives in [place].  He is married.  He does not  smoke, take alcoholic beverages or use recreational drugs.  He is retired.  He used to work at [employer].  He has two children who are alive and  well.   FAMILY HISTORY:  Mother died from complications of diabetes.  Father died of  leukemia at age 64.  He has one sister older than he who is alive and well.   REVIEW OF SYSTEMS:  The patient is not having any fevers, chills, night  sweats.  No uncontrollable or unexplained weight loss or gain.  HEENT:  No  epistaxis.  No nasal discharge.  No vertigo.  No photophobia.  INTEGUMENT:  No rashes or nonhealing ulcerations.  CARDIOPULMONARY:  The patient has some  history of exertional chest pain, mild dyspnea on exertion.  No history of  palpitations.  No claudication.  No orthopnea.  No paroxysmal nocturnal  edema.  No lower extremity edema.  UROGENITAL:  The patient has a history of benign prostatic hypertrophy with hesitancy in initiating stream.  GI:  The  patient has ongoing reflux, which is  well-controlled with Prilosec.  He has  a history of GI bleed x2, once  requiring hospitalization with transfusion.  MUSCULOSKELETAL:  The patient takes salsalate for arthritis.  He has  afflictions of the hands and the knee joints.  He is status post total knee  arthroplasty.  He also has a history of gout.  NEUROLOGIC:  No focal  deficits noted.  Cranial nerves II-XII are grossly intact.   PHYSICAL EXAMINATION:  VITAL SIGNS:  The patient is afebrile.  Blood  pressure 111/59, heart rate 51, oxygen saturation 99% on 2 liters.  GENERAL:  He is alert and oriented x3 in no acute distress.  HEENT:  Pupils are equal, round and reactive to light.  Extraocular  movements are intact.  NECK:  Supple.  No carotid bruits auscultated.  No jugular venous  distention.  No cervical lymphadenopathy.  LUNGS:  Clear to auscultation and percussion bilaterally.  HEART:  Regular rate and rhythm without murmur.  ABDOMEN:  Soft, nondistended.  Bowel sounds are present.  Abdominal aorta is  nonpulsatile, nonexpansive.  EXTREMITIES:  No evidence of clubbing, cyanosis or edema.  Dorsalis pedis  pulses are easily palpable at 4/4 bilaterally.  The radial pulses are 4/4  bilaterally.  NEUROLOGIC:  No neurologic deficits noted.   IMPRESSION:  1.  Admitted with syncope of unclear etiology.  2.  Heart catheterization on Nov 11, 2005.  Mild coronary plaque.  3.  Echocardiogram on Nov 11, 2005.  Ejection fraction 60%.  No left      ventricular wall motion abnormalities.  Mild mitral regurgitation.  4.  Negative orthostatic blood pressure measurements.  5.  Electrocardiogram shows first-degree AV block, right bundle branch      block.   For other assessments, please see past medical history.   PLAN:  Patient will discharge on May 15th with 30 day Holter monitor to be  obtained from [facility].  He will have an appointment with Dr.  Jens Som on Tuesday, May 29th at 11:30.  He will see Dr. Ladona Ridgel on   Wednesday, June 6th, at 12:30.  He is asked not to drive and to comply with  this until he sees Dr. Ladona Ridgel.      Maple Mirza, P.A.    ______________________________  Doylene Canning. Ladona Ridgel, M.D.    GM/MEDQ  D:  11/12/2005  T:  11/12/2005  Job:  [id number]

## 2010-11-16 NOTE — Cardiovascular Report (Signed)
NAME:  [name] [name], [name] [name] NO.:  [id number]   MEDICAL RECORD NO.:  [id number]                   PATIENT TYPE:  OIB   LOCATION:  2854                                 FACILITY:  [facility]   PHYSICIAN:  Carole Binning, M.D. [facility]         DATE OF BIRTH:  1924-01-12   DATE OF PROCEDURE:  10/12/2003  DATE OF DISCHARGE:  10/12/2003                              CARDIAC CATHETERIZATION   PROCEDURE PERFORMED:  Right and left heart catheterization with coronary  angiography and left ventriculography.   INDICATION:  Mr. Edmonds is a 77 year old male with a history of congestive  heart failure secondary to diastolic dysfunction.  He has also had symptoms  of intermittent chest pain.  He was recently seen in the office by Dr.  Jens Som.  Because of difficulties in managing his volume status as well as  recurrent episodes of chest pain, he was referred for right and left heart  catheterization to assess his hemodynamics, his filling pressures and  coronary anatomy.  Of note, he does have a small pericardial effusion and we  are also requested to rule out constrictive pericarditis.   PROCEDURAL NOTE:  An 8 French sheath was placed in the right femoral vein, 6  French sheath in the right femoral artery.  Right heart catheterization was  performed with a Swan-Ganz catheter.  We obtained simultaneous measurements  of pulmonary capillary wedge and left ventricular end-diastolic pressures.  We also obtained simultaneous measurements of right ventricular and left  ventricular pressures.  Left ventriculography was performed with an angled  pigtail catheter.  Coronary arteriography was performed with standard  Judkins 6 French catheters.  Contrast was Omnipaque.  There were no  complications.   RESULTS:  Hemodynamics.  Right atrial mean pressure 15, right ventricular  pressure 48/17, pulmonary artery pressure 48/24, pulmonary capillary wedge  pressure mean 24, left ventricular  pressure 180/22.  Aortic pressure 180/82.  There was no significant gradient between measured between the pulmonary  capillary wedge pressure and the left ventricular end-diastolic pressure.  There was no aortic valve gradient.  Simultaneous measurements of left  ventricular vs. right ventricular pressures showed concordance of systolic  pressures.  The end-diastolic pressures differed by a mean of approximately  9 mmHg.  These findings are not consistent with constrictive physiology.   Cardiac output by the thermodilution method is 4.2, cardiac index 2.1.  Cardiac output by the FICK method is 4.0, cardiac index 2.0.   LEFT VENTRICULOGRAM:  Wall motion is normal.  Ejection fraction calculated  at 58%.  There is 2-3+ moderate mitral regurgitation.   CORONARY ARTERIOGRAPHY (RIGHT DOMINANT):  Left main is normal.   Left anterior descending artery has a 20% stenosis in the mid vessel.  The  LAD gives rise to a normal sized first diagonal and small second diagonal.  The first diagonal has a 20% stenosis at its origin.   Left circumflex gives rise to a small  first marginal, normal sized second  marginal and small third and fourth marginal branches.  There is a 20%  stenosis in the ostium of the left circumflex.  There is a 20% stenosis at  the origin of the second obtuse marginal branch.   Right coronary artery is a dominant vessel.  There is a 30% stenosis in the  ostium of the right coronary artery and a diffuse 20% stenosis in the  proximal right coronary artery.  The distal right coronary artery gives rise  to a normal sized posterior descending artery, a small first posterior  lateral branch, normal sized second posterior lateral branch and a small  third posterior lateral branch.   IMPRESSIONS:  1. Elevated right and left heart filling pressures with moderate pulmonary     hypertension.  Pressure measurements are not suggestive of constrictive     physiology.  2. Normal left  ventricular systolic function with moderate mitral     regurgitation.  3. Mild nonsignificant coronary artery disease.   RECOMMENDATION:  For continued medical therapy for the patient's diastolic  dysfunction and moderate mitral regurgitation.                                               Carole Binning, M.D. [facility]    MWP/MEDQ  D:  10/12/2003  T:  10/13/2003  Job:  [id number]   cc:   Olga Millers, M.D. [facility]   Cardiac Cath Lab   [name], M.D.  [address]  [place]  [place] [address]  Fax: [phone]

## 2010-11-16 NOTE — Cardiovascular Report (Signed)
NAMEMarland [name]  ELERY, CADENHEAD NO.:  [id number]   MEDICAL RECORD NO.:  [id number]          PATIENT TYPE:  INP   LOCATION:  2037                         FACILITY:  [facility]   PHYSICIAN:  Rollene Rotunda, M.D.   DATE OF BIRTH:  Feb 02, 1924   DATE OF PROCEDURE:  11/11/2005  DATE OF DISCHARGE:                              CARDIAC CATHETERIZATION   PRIMARY CARE PHYSICIAN:  Dr. Barry Dienes. [place].   PROCEDURE:  Left heart catheterization/coronary arteriography.   INDICATION:  Evaluate patient with syncope and exertional chest pain.  He  had nonobstructive coronary disease on a previous catheterization in 2005.   PROCEDURAL NOTE:  Left heart catheterization was performed via the right  femoral artery.  The artery was cannulated using an anterior wall puncture.  A #6-French arterial sheath was inserted via the modified Seldinger  technique.  Preformed Judkins catheter was utilized.  The patient tolerated  the procedure well and left the lab in stable condition.   RESULTS:   HEMODYNAMICS:  LV 152/11, Ao 151/71.   CORONARIES:  The left main was normal.   The LAD was normal .  There was a large diagonal which was normal.   The circumflex had a proximal 25% stenosis in the A-V groove.  There were  luminal irregularities further on in the groove.  The mid obtuse marginal #1  was large and normal.  OM-2 was small and normal.  OM-3 was large and  normal.   The right coronary artery was a very large dominant vessel.  There was a  long proximal 25% stenosis.  The PDA was large and normal.  The  posterolateral was large to moderate-sized and normal.   Left ventricle:  The left ventricle was not injected secondary to mild renal  insufficiency.  We did cross for pressures.   CONCLUSION:  Mild coronary plaque.   PLAN:  The patient will have further workup of his syncope per Dr. Jens Som.           ______________________________  Rollene Rotunda, M.D.     JH/MEDQ  D:  11/11/2005   T:  11/12/2005  Job:  [id number]   cc:   [name], M.D.  Fax: [phone]

## 2010-11-16 NOTE — Discharge Summary (Signed)
[place]  Patient:    [name] [name], [name] [name]                      MRN: [id number] Adm. Date:  [id number] Disc. Date: 05/05/00 Attending:  Junious Silk Dictator:   Tereso Newcomer, P.A. CC:         [name], M.D.   Discharge Summary  DATE OF BIRTH:  07/18/1923  DISCHARGE DIAGNOSIS: 1. Pericarditis. 2. Hypertension. 3. Hyperlipidemia. 4. History of deep vein thrombosis/ pulmonary embolism 1976. 5. History of bleeding ulcer. 6. History of nephrolithiasis. 7. Status post cholecystectomy. 8. Status post right total knee replacement August 2000.  ADMISSION HISTORY:  This 77 year old male with no prior cardiac history presented to the emergency room with severe new onset mid sternal chest pain since Wednesday prior.  His cardiac risk factors included hypertension, hyperlipidemia and a remote history of tobacco use.  The patient denied any history of exertional chest pain until this week.  He developed severe sharp chest pain, Wednesday, after completing a workout on an exercise machine. This recurred again on Thursday, after a similar workout.  He denied any associated shortness of breath, diaphoresis or nausea symptoms.  Symptoms resolved after 45 minutes to an hour after sitting.  He developed similar chest pain on the date prior to admission at rest that waxed and waned since that time.  He presented to the emergency room with chest pain.  His admission EKG revealed normal sinus rhythm, heart rate of 71, 1 mm ST elevations inferolaterally, no significant change in the second EKG.  The patient reported chest pain with lying flat and inspiration.  ALLERGIES:  No known drug allergies.  PHYSICAL EXAMINATION:  GENERAL:  Revealed a male in no acute distress.  VITAL SIGNS:  Blood pressure 161/72, pulse 70, respirations 22, temperature 99.2.  Oxygen saturation 97%.  NECK:  Without bruits.  HEART:  Regular rate and rhythm.   Decreased heart sounds.  LUNGS:  Decreased breath sounds in the left lower lobe.  No crackles or wheezes.  EXTREMITIES:  Without edema.  Quick look echocardiogram performed in the emergency room revealed no obvious wall motion abnormalities on parasternal views, no significant effusions.  HOSPITAL COURSE:  The patient was admitted for chest pain.  It is felt his chest pain has a strong pleuritic component and the differential included pericarditis, pulmonary embolus, acute ischemia which seemed less likely based on his symptoms on echocardiogram.  He had serial enzymes drawn and these were negative for myocardial damage.  Total CK #1 99; CK #2 65; CK #3 44, CK-MB #1 1.6, CK-MB #2 1.0, CK-MB #3 0.5.  Troponin I #1 less than 0.01; Troponin I #2 0.01.  The patients TSH came back normal at 2.883.  His erythrocyte sedimentation rate was elevated at 34.  His D-dimer was negative at 0.44.  The patients chest x-ray revealed left lower lobe atelectasis versus infiltrate as read by the radiologist.  The patients chest CT was without pulmonary embolus, no dissection noted.  Small pericardial effusion noted and no DVTs noted.  The patient was placed on Indocin for suspected pericarditis.  On the evening of May 04, 2000, his chest pain resolved and did not recur throughout the remainder of admission.  On the morning of May 05, 2000, he was felt stable enough for discharge to home on Indocin for a total of 5 days with close followup with a PA in 2 weeks.  His primary care physician, Dr. [name], stopped by to see the patient for a courtesy visit.  He noted that the EKG done in his office on Thursday, May 01, 2000, did not show any ST elevations.  He had planned on performing a gaited exercise cardiolite on Tuesday of this week.  It is recommended that be postponed for at least 3-4 weeks.  OTHER LABORATORY DATA:  White count 7,500, hemoglobin 13.9, hematocrit 40.0. Platelet count  170,000.  INR 1.0.  Sodium 136, potassium 4.7, chloride 101, CO2 31, glucose 112, BUN 17, creatinine 1.2, total bilirubin 1.3, alk. phos. 58, SGOT 19, SGPT 8, total protein 7, albumin 3.5, calcium 8.5.  DISCHARGE MEDICATIONS: 1. Indocin 25 mg t.i.d. for 5 days. 2. Toprol XL 25 mg q. day. 3. Xenical 120 mg q. day. 4. Doxazosin 4 mg q. day. 5. Coated aspirin 81 mg q. day. 6. Tagamet 200 mg q. day. 7. Multivitamin q. day. 8. Calcium q. day. 9. Vitamin E q. day.  ACTIVITIES:  As tolerated.  DIET:  Low fat, low salt diet.  SPECIAL INSTRUCTIONS:  The patient has been informed of his stress test with Dr. [name].  Dr. [name] will be rescheduled and he needs to call for rescheduled appointment.  FOLLOWUP:  Follow up is with Dr. Jens Som his PA on Monday, May 19, 2000, at 10:30 a.m. DD:  05/05/00 TD:  05/05/00 Job: [id number]

## 2010-11-16 NOTE — Op Note (Signed)
NAME:  TANIA, PERROTT NO.:  [id number]   MEDICAL RECORD NO.:  [id number]          PATIENT TYPE:  OIB   LOCATION:  2853                         FACILITY:  [facility]   PHYSICIAN:  Doylene Canning. Ladona Ridgel, M.D.  DATE OF BIRTH:  05/27/1924   DATE OF PROCEDURE:  12/27/2005  DATE OF DISCHARGE:                                 OPERATIVE REPORT   PROCEDURE PERFORMED:  Head-up tilt table testing.   INDICATIONS:  Unexplained syncope.   INTRODUCTION:  The patient is a 77 year old male with a history of  unexplained syncope which occurred while standing.  He has overall preserved  LV function and catheterization demonstrating no significant coronary  disease.  He does have mitral regurgitation and diastolic dysfunction and  concomitant heart failure.  He is now referred for head-up tilt table  testing.   PROCEDURE:  After informed consent was obtained, the patient was taken to  the diagnostic EP lab in the fasting state.  After the usual preparation, he  was placed in the supine position where his initial blood pressure was  160/100.  After 5 minutes in the supine position, he was taken to the 70  degrees head-up tilt table position.  His blood pressure remained in the 160  range with a heart rate in the mid 60s.  Over the next 45 minutes he was  observed.  His blood pressure gradually decreased during tilting from 170  range down to low 120s.  His heart rate really remained unchanged, typically  raising the high 50s to the high 60s.  The patient, in an attempt try to  reproduce his symptoms, was asked to turn his head sharply to the left and  the right and to look straight up in the air extending his neck and this did  not result in any significant bradycardia whatsoever.  He was subsequently  taken back to the supine position and returned to his room in satisfactory  condition.   COMPLICATIONS:  There were immediate procedure complications.   RESULTS:  This demonstrated a negative  head up head-up tilt table test for  inducible syncope.  He does demonstrate a 50 mm drop in his blood pressure  without concomitant symptoms, this of course, being of unclear importance.           ______________________________  Doylene Canning. Ladona Ridgel, M.D.     GWT/MEDQ  D:  12/27/2005  T:  12/27/2005  Job:  [id number]   cc:   Olga Millers, M.D. [facility]  [address]  [address]  [place]  [place] [address]

## 2010-11-16 NOTE — Discharge Summary (Signed)
NAME:  [name] [name], [name] [name] NO.:  [id number]   MEDICAL RECORD NO.:  [id number]          PATIENT TYPE:  INP   LOCATION:  2037                         FACILITY:  [facility]   PHYSICIAN:  Danh Bayus. Wall, M.D.   DATE OF BIRTH:  1924-05-08   DATE OF ADMISSION:  11/09/2005  DATE OF DISCHARGE:  11/12/2005                                 DISCHARGE SUMMARY   ALLERGIES:  No known drug allergies.   PRINCIPAL DIAGNOSES:  1.  Admitted with syncope of unclear etiology.  2.  Orthostatic measurements are negative for orthostatic hypotension.      Blood pressure actually rose on standing after 3 minutes.  3.  Head trauma.  CT of the head normal.  No acute intracranial abnormality,      no fracture.  4.  Right hand trauma.  X-rays negative for displacement or fracture.  5.  Admittance troponin I studies negative x2.  6.  Rule out pulmonary embolism.      1.  V/Q scan very low probability of pulmonary embolism.      2.  Moderate obstructive lung disease.  7.  Echocardiogram:  Ejection fraction 60%, no left ventricular wall motion      abnormalities, mild mitral regurgitation.  This echocardiogram was Nov 11, 2005.  8.  Catheterization Nov 11, 2005.      1.  Left circumflex 25% in the atrioventricular groove, obtuse marginals          1-3 are normal.      2.  Right coronary artery proximal 25% stenosis, the posterior          descending coronary artery was normal.      3.  The left anterior descending coronary artery was normal, diagonal          normal.  9.  Diastolic dysfunction.  Congestive heart failure:  Class II.   SECONDARY DIAGNOSES:  1.  Nephrolithiasis.  2.  Symptomatic hypotension on normal doses of diuretics.  3.  History of mitral regurgitation.  4.  Gastroesophageal reflux disease.  5.  Hyperlipidemia.  6.  Moderate pulmonary hypertension.  7.  Benign prostatic hypertrophy status post transurethral resection of the      prostate.  8.  Status post total knee  arthroplasty.  9.  History of gastrointestinal bleed in 1986 requiring transfusion.  10. History of deep venous thrombosis and pulmonary embolism 2001 status      post ankle fracture.  11. Last echocardiogram April 2006, ejection fraction 45% with mild mitral      regurgitation.   PROCEDURE:  1.  CT of the head:  No acute intracranial abnormality.  2.  Left heart catheterization Nov 11, 2005, as dictated above.  3.  Echocardiogram Nov 11, 2005, as dictated above.  4.  V/Q scan:  Very low probability of pulmonary embolism.   BRIEF HISTORY:  Mr. Lagace is a 77 year old Caucasian gentleman who  presented to the emergency room by emergency medical services after being  discovered with syncope.   Mr. Ashford had been walking for a morning  walk.  He had had breakfast about  2 hours prior to this.  He has been walking approximately 10 minutes when he  describes that he felt suddenly swimmy headed.  There was no recollection of  anything else until he awoke being attended to by emergency medical services  personnel.  It is their recounting that the patient had some bradycardia  with possible question of heart block.  There is no record, however,  available concerning this.  The patient recounts that he had nausea upon  awakening.  This was confirmed by EMS and emergency room personnel.  The  patient has never had prior history of syncope although he has had on  several very sporadic occasions felt slightly dizzy when going from a  kneeling position - for example, tying shoes - to a standing position.  Usually this dizziness, when it occurs, passes after about 30 seconds.   The patient will have CT scan of the head to rule out trauma, x-ray of the  right hand for the same diagnostic purpose.  He will have cardiac enzymes  cycled.  Electrocardiogram shows first degree AV block, sinus rhythm, with  right bundle-branch block.  Mr. Rosselli will be scheduled for 2-D  echocardiogram as well as  catheterization study.   HOSPITAL COURSE:  The patient presented to [facility] by way of  emergency medical services after being found unconscious by the roadside.  The patient had been taking his usual daily walk when he felt suddenly dizzy  and, unable to control his fall, collapsed.  He does not remember anything  from the point of becoming very, very dizzy to awakening to the services of  emergency medical personnel.  His electrocardiogram shows right bundle-  branch block and first degree AV block.  He has been in sinus rhythm  throughout this hospitalization.  He ruled out for acute myocardial  infarction by serial troponin I studies.  A V/Q scan showed low probability  of pulmonary embolism.  He has had orthostatic hypotension tests including  blood pressure taken after 3 minutes of standing which only demonstrated  increased blood pressure after 3 minutes of standing.  He has had no further  episodes of dizziness or syncope during this hospitalization, nor is there  any evidence of severe nocturnal bradycardia.  He has had no pauses on  telemetry.  He underwent a left heart catheterization which showed  essentially mild coronary plaque.  A followup 2-D echocardiogram showed  ejection fraction 605 and no left ventricular wall motion abnormalities and  only mild mitral regurgitation.  The patient will discharge hospital day #4.  He will have followup in 2 weeks with Dr. Jens Som and he will be fitted  with a 30-day Holter monitor from [facility].  [facility]  office will call for him to pick this up.  His discharge office visit is to  see Dr. Jens Som Tuesday, Nov 26, 2005, at 11:30 a.m.  He will see Dr.  Ladona Ridgel Wednesday, December 04, 2005, at 12:30 p.m.  He is asked not to drive  until he sees Dr. Ladona Ridgel in the office and he agrees to this.  He and his  wife both understand the importance of this.  His medications at discharge are:  1.  Furosemide 10 mg daily.   2.  Potassium chloride 10 mEq daily.  3.  Enteric-coated aspirin 81 mg daily.  4.  Lisinopril 10 mg daily.  5.  Prilosec 20 mg daily.  6.  Allopurinol 100 mg daily.  7.  Proscar 5 mg daily.  8.  Ocuvite with lutein ophthalmic solution daily.  9.  Salsalate 500 mg twice daily.   LABORATORY STUDIES AT DISCHARGE:  Complete blood count:  White cells are 5,  hemoglobin 13.4, hematocrit 39.5, platelets 141.  Serum electrolytes:  Sodium 139, potassium 4.1, chloride 107, carbonate 30, BUN is 16, creatinine  1.4, glucose 117.  PT on admission was 13.9, INR 1.1.  TSH this admission  was 1.439.  The D-dimer was 0.89.  The BNP was 77.  HgbA1c was 5.0.      Maple Mirza, P.A.      Dandy C. Wall, M.D.  Electronically Signed    GM/MEDQ  D:  11/12/2005  T:  11/13/2005  Job:  [id number]   cc:   Olga Millers, M.D. [facility]  [address]  [address]  [place]  [place] [address]   [name], M.D.  Fax: [phone]   Doylene Canning. Ladona Ridgel, M.D.  [address]  [address]  [place]  [place] [address]

## 2010-11-16 NOTE — Discharge Summary (Signed)
[place]. [facility]  Patient:    [name] [name], [name] [name]                      MRN: [id number] Adm. Date:  [id number] Disc. Date: [id number] Attending:  Talitha Givens Dictator:   Annett Fabian, P.A. CC:         Vania Rea. Jarold Motto, M.D. [facility]   Referring Physician Discharge [place]  DISCHARGE DIAGNOSES 1. Hypertension. 2. Recent pericarditis. 3. History of deep venous thrombosis/pulmonary embolus, with negative chest    and leg CTs on May 04, 2000. 4. History of gastrointestinal bleed. 5. History of renal calculi. 6. Status post cholecystectomy. 7. Status post right total knee replacement.  HOSPITAL COURSE:  Patient presented to the ER complaining of some chest pain which was different in character from his pain from May 07, 2000 visit for pericarditis.  There was some relief of pain with nitroglycerin.  There were no EKG changes.  He was taken urgently to the catheterization lab by Dr. Veneda Melter.  Catheterization results:  #1 - Left main -- mild disease; #2 - LAD and first diagonal -- mild disease; #3 - circumflex -- four OMs, small OM, proximal with an ostial 50%; #4 - RCA -- dominant PDA, 20-30% lesions proximal and mid-area; #5 - aortic root was normal caliber; #6 - there was no dissection; #7 - diastolic dysfunction; #8 - normal left ventricular function with an EF greater than 65%.  Dr. Vania Rea. Jarold Motto saw the patient after the catheterization lab and felt there was some involvement of gastroesophageal reflux disease.  He started the patient on Protonix.  On his visit the next morning, Dr. Jarold Motto also felt there was some involvement due to the findings on chest x-ray of possible pneumonia, so he started the patient on a 10-day run of Tequin 400 mg.  DISCHARGE MEDICATIONS 1. Protonix 40 mg once daily. 2. Tequin 400 mg q.d. x 10 days total. 3. Xenical 120 mg q.d. 4. Doxazosin 4 mg q.d. 5. Enteric-coated aspirin 81 mg q.d.  VITAL  SIGNS:  Heart rate of 88, pressure of 120/82, respiratory rate of 20, temperature of 99.2.  LABORATORY AND X-RAY FINDINGS:  White count 9.5, hemoglobin 14, hematocrit 40, platelets 408,000.  Sodium 141, potassium 4.5, chloride 102, BUN 23, creatinine 1.3, glucose 128.  Chest x-ray revealed cardiomegaly with some mild pulmonary edema.  In the left lower lobe, there is also a question of atelectasis versus an infiltrate.  Bedside 2-D echocardiogram by Dr. Luis Abed revealed a small pericardial effusion with good ejection fraction.  EKG revealed borderline sinus/tachycardia with no ischemic changes.  DISCHARGE INSTRUCTIONS 1. Patient was counseled to avoid tub baths, sexual activity, driving or heavy    lifting for two days. 2. Patient is advised to follow a low-fat and low-cholesterol diet. 3. Patient was advised to observe the catheterization site for swelling,    bleeding, pain or discharge and to call the [place] office if there are any    problems.  FOLLOWUP:  Patient is to follow up with Dr. Jarold Motto in one week; he is to call upon discharge.  He is to follow up with Dr. Madolyn Frieze. [place] on January 3rd at 11:15 a.m.  DISPOSITION:  The patient is discharged home in stable condition. DD:  05/19/00 TD:  05/19/00 Job: [id number]

## 2010-11-16 NOTE — Assessment & Plan Note (Signed)
[facility]                            CARDIOLOGY OFFICE NOTE   [name] [name], [name] [name] [name] [name]                      MRN:          [id number]  DATE:07/04/2006                            DOB:          [birth year]    [name] returns for followup today.  Please refer to my previous  notes for details. Since I last saw him he is doing well with no  dyspnea, chest pain, palpitations, and no syncope.  There is no pedal  edema.   MEDICATIONS INCLUDE:  1. Zocor 80 mg p.o. daily.  2. Lisinopril 20 mg p.o. daily.  3. Finasteride 5 mg p.o. daily.  4. Omeprazole.  5. Salicylate.  6. Ocuvite.  7. Allopurinol.  8. Aspirin 81 mg p.o. daily.  9. Lasix 10 mg p.o. daily.  10.Potassium 10 mEq p.o. daily.   PHYSICAL EXAMINATION:  Today shows a blood pressure of 126/74 and his  pulse is 57.  His neck is supple.  His chest is clear.  CARDIOVASCULAR EXAM:  Reveals a regular rate and rhythm.  ABDOMINAL EXAM:  Shows the question of a pulsatile mass.  There is no  bruit.  EXTREMITIES:  Show no edema.   ELECTROCARDIOGRAM:  Shows a sinus rhythm at a rate of 57.  A prior  inferior infarct cannot be excluded.  There is a right bundle branch  block.   DIAGNOSES:  1. History of syncopal episode, etiology unclear.  2. Preserved left ventricular function.  3. History of congestive heart failure secondary to diastolic      function.  4. Hypertension.  5. Hyperlipidemia.  6. Mild cerebrovascular disease.  7. History of macrocytosis.   PLAN:  [name] is doing well from the symptomatic standpoint and we  will continue with his present medications.  We will schedule him to  have an ultrasound of his abdomen to exclude  aneurysm.  I will also check a CBC, CMET and lipids today.  He will need  followup carotid Dopplers in July 2008.  He will see me back in 9  months.     Madolyn Frieze Jens Som, MD, [facility]  Electronically Signed    BSC/MedQ  DD: 07/04/2006  DT: 07/04/2006  Job  #: [id number]   cc:   [name], M.D.

## 2010-11-16 NOTE — H&P (Signed)
NAME:  [name] [name], [name] [name] NO.:  [id number]   MEDICAL RECORD NO.:  [id number]          PATIENT TYPE:  EMS   LOCATION:  MAJO                         FACILITY:  [facility]   PHYSICIAN:  Jesse Sans. Wall, M.D.   DATE OF BIRTH:  [birth year]   DATE OF ADMISSION:  11/09/2005  DATE OF DISCHARGE:                                HISTORY & PHYSICAL   PRIMARY CARDIOLOGIST:  Olga Millers, M.D. [facility].   PRIMARY CARE PHYSICIAN:  [name], M.D.   CHIEF COMPLAINT:  Syncope.   Mr. Cada is a very pleasant 76 year old Caucasian gentleman, initially  seen by the ED physician today for an episode of syncope.  Mr. Lennox is  followed by Dr. Jens Som for a history of CHF with known mitral  regurgitation and moderate pulmonary hypertension.  He was out walking this  morning which is a normal activity for him.  He tries to walk at least every  other day.  He had been ambulating on the roadside for approximately 10  minutes prior to the syncopal episode.  He states he felt fine before  walking.  He did experience some mild nonspecific chest tightness which he  states he has had numerous times while ambulating and he usually continues  his walk and it resolved by the end of his walk without any further  problems.  Today, he experienced that sensation about two to three minutes  before the syncopal episode.  He states he felt fine.  He did not have any  discomfort immediately prior to the syncopal episode.  He felt slightly  lightheaded.  He woke up to find himself laying supine on a paved road with  EMS in attendance.  Down time, according to wife, could be anywhere from  five to ten minutes as the patient had just begun his walk and was on a  neighboring road.  His only complaint was nausea upon awakening, and he  complained of nausea here in the emergency room that has been relieved with  Zofran.  According to the ER nurse, EMS reported the patient had some sinus  brady possibly in the  30s with some questionable heart block.  However,  there are no records available to verify this.   PAST MEDICAL HISTORY:  1.  Cardiac catheterization, in 2005, that showed elevated right and left      heart filling pressures with moderate pulmonary hypertension.  The      patient has normal left ventricular systolic function with moderate      mitral regurgitation with mild non-significant coronary artery disease      at that time.  The LAD had a 20% stenosis in the mid vessel.  The first      diagonal had a 20% stenosis.  There was a 20% stenosis at the origin of      the second obtuse marginal and the right coronary artery had a 30%      stenosis in the ostium of the right coronary artery and a diffuse 20%      stenosis in the proximal RCA.  2.  Congestive heart failure.  3.  Kidney stones.  4.  Symptomatic hypotension with normal doses of diuretics.  5.  Mitral regurgitation.  6.  GERD.  7.  Hyperlipidemia.  8.  Moderate pulmonary hypertension.  9.  BPH status post TURP.  10. Knee replacement.  11. History of GI bleed which was years ago, per the patient's wife.  12. History of DVT and pulmonary embolism in 2001, post-op ankle fracture.  13. Last echocardiogram, was in April 2006, showing an EF of 45% with mild      MR.   ALLERGIES:  No known drug allergies.   MEDICATIONS:  1.  Lasix 10 mg daily.  2.  Flomax, discontinued prior to this admission by primary care.  3.  KCl 10 mEq.  4.  Aspirin 81 mg daily.  5.  Lisinopril 20 mg daily.  6.  Prilosec 20 mg daily.  7.  Allopurinol 100 mg daily.  8.  __________  5 mg daily.  9.  Ocuvite with Lutein daily.  10. Salsalate 500 mg b.i.d.   SOCIAL HISTORY:  The patient lives in [place] with his wife.  He is not  employed.  He has no history of ETOH, tobacco, drug, or herbal medication  use.  He tries to ambulate at least every other day.  He tries to follow a  heart healthy diet.   FAMILY HISTORY:  Mother had diabetes.  No  known coronary artery disease  otherwise.   REVIEW OF SYSTEMS:  Positive for fleeing chest discomfort, mild peripheral  edema, syncopal episode with mild dizziness today.  GI:  Positive for some  nausea.   PHYSICAL EXAMINATION:  VITAL SIGNS:  Temp 97, pulse 74, respirations 22,  blood pressure 178/87 initially, sating 100% on room air.  GENERAL:  He is in no acute distress.  HEENT:  Pupils are equal, round, reactive to light.  Sclerae are clear.  He  has a dime-size contusion to the left upper occipital area.  CARDIOVASCULAR:  Heart rate regular rhythm.  S1 and S2.  LUNGS:  Clear to auscultation bilateral.  SKIN:  He has a laceration to his left ring finger.  ABDOMEN:  Soft and nontender.  EXTREMITIES:  Without clubbing or cyanosis.  He has 2+ DPs bilateral.  NEUROLOGIC:  He is alert and oriented x3.  Cranial nerves II-XII grossly  intact.   Chest x-ray showing some cardiomegaly.  EKG at a rate of 70 sinus rhythm  with a new first degree chronic right bundle branch block.  The patient does  have some T wave inversions and aVL in lead I which are new.   LABORATORY WORK:  WBCs 5.0, hemoglobin 14.3, hematocrit 41.8 with a platelet  count of 157,000.  Sodium 142, potassium 4.2, BUN 26, creatinine 1.4,  glucose 126.  AST 23, ALT is 12.  Point-of-care markers, troponin less than  0.05 x1 with a CK-MB of 2.6.  Urinalysis negative.   1.  Dr. Juanito Doom in to examine and assess the patient with syncope of      questionable etiology.  The patient had some chest discomfort minutes      before syncopal episode.  He however has this frequently.  However, has      new T wave changes in lateral lead.  2.  Head trauma.  CT of the head results pending.  3.  Laceration to left ring finger.  Emergency room doctor to evaluate.  4.  First degree A-V block which is new,  questionable significance. 5.  History of congestive heart failure.  Chest x-ray with no volume      overload.   PLAN:  1.  Admit  the patient to telemetry.  2.  Cycle cardiac enzymes.  If enzymes are positive, the patient will need      cardiac catheterization for re-evaluation.  3.  Dr. Daleen Squibb will discuss with Dr. Jens Som.  4.  CT of the head results pending.  5.  We will check orthostatics also.      Dorian Pod, NP      Jesse Sans. Wall, M.D.  Electronically Signed    MB/MEDQ  D:  11/09/2005  T:  11/09/2005  Job:  [id number]

## 2010-11-27 ENCOUNTER — Encounter: Payer: Self-pay | Admitting: Cardiovascular Disease

## 2010-12-03 ENCOUNTER — Encounter: Payer: Self-pay | Admitting: Cardiology

## 2010-12-20 ENCOUNTER — Ambulatory Visit (INDEPENDENT_AMBULATORY_CARE_PROVIDER_SITE_OTHER): Payer: Medicare Other | Admitting: Cardiology

## 2010-12-20 ENCOUNTER — Encounter: Payer: Self-pay | Admitting: Cardiology

## 2010-12-20 DIAGNOSIS — I350 Nonrheumatic aortic (valve) stenosis: Secondary | ICD-10-CM | POA: Insufficient documentation

## 2010-12-20 DIAGNOSIS — I10 Essential (primary) hypertension: Secondary | ICD-10-CM

## 2010-12-20 DIAGNOSIS — Z950 Presence of cardiac pacemaker: Secondary | ICD-10-CM | POA: Insufficient documentation

## 2010-12-20 DIAGNOSIS — I2510 Atherosclerotic heart disease of native coronary artery without angina pectoris: Secondary | ICD-10-CM

## 2010-12-20 DIAGNOSIS — E785 Hyperlipidemia, unspecified: Secondary | ICD-10-CM

## 2010-12-20 DIAGNOSIS — I509 Heart failure, unspecified: Secondary | ICD-10-CM

## 2010-12-20 DIAGNOSIS — I5032 Chronic diastolic (congestive) heart failure: Secondary | ICD-10-CM

## 2010-12-20 DIAGNOSIS — I6529 Occlusion and stenosis of unspecified carotid artery: Secondary | ICD-10-CM

## 2010-12-20 NOTE — Assessment & Plan Note (Signed)
Mild aortic stenosis on previous echo. Repeat studies in the future.

## 2010-12-20 NOTE — Assessment & Plan Note (Signed)
Continue statin. 

## 2010-12-20 NOTE — Assessment & Plan Note (Signed)
Blood pressure control. Continue present medications. 

## 2010-12-20 NOTE — Progress Notes (Signed)
HPI: [name] is a very pleasant gentleman who has a history of congestive heart failure, felt secondary to diastolic dysfunction; hypertension; hyperlipidemia; mild coronary artery disease; and cerebrovascular disease.  He also has had a previous pacemaker placed in December 2008 following a cardiac arrest at [facility].  This was placed at [facility] and apparently was bradycardia mediated.  His most recent cardiac catheterization here was in April 2005.  At that time, he had nonobstructive coronary artery disease, but he did have elevated right and left filling pressures. Last Myoview was performed on February 05, 2007. Ejection fraction was 65% and the perfusion was normal. His most recent echocardiogram  was performed in February of 2011 and showed normal LV function, mild aortic stenosis with a mean gradient of 9 mmHg, mild mitral and tricuspid regurgitation and mild biatrial enlargement.  Carotid Dopplers in Jan 2012 showed 60-79% left and 0-39% right stenosis. Followup is recommended in one year. I last saw him in March 2012. Since then, he continues to have dyspnea on exertion but somewhat improved compared to previous. No orthopnea or PND. No chest pain. Pedal edema persists but mildly improved.  Current Outpatient Prescriptions  Medication Sig Dispense Refill  . allopurinol (ZYLOPRIM) 300 MG tablet Take 300 mg by mouth daily.        Marland Kitchen aspirin 81 MG tablet Take 81 mg by mouth daily.        Marland Kitchen atorvastatin (LIPITOR) 80 MG tablet 1/2 tab po qd      . citalopram (CELEXA) 20 MG tablet Take 10 mg by mouth daily.        . finasteride (PROSCAR) 5 MG tablet Take 5 mg by mouth daily.        . flunisolide (NASALIDE) 0.025 % SOLN Inhale 2 sprays into the lungs 2 (two) times daily.        . furosemide (LASIX) 40 MG tablet Take 40 mg by mouth daily.       . metoprolol succinate (TOPROL-XL) 25 MG 24 hr tablet Take 25 mg by mouth 2 (two) times daily.        . Multiple Vitamins-Minerals (OCUVITE  EXTRA) TABS Take 2 tablets by mouth 2 (two) times daily.        . pantoprazole (PROTONIX) 40 MG tablet Take 40 mg by mouth 2 (two) times daily.        . Polyethylene Glycol 3350 LIQD daily.        . vitamin B-12 (CYANOCOBALAMIN) 100 MCG tablet Take 50 mcg by mouth daily.           Past Medical History  Diagnosis Date  . Osteoarthritis   . Gout   . Hypertension   . History of coronary artery disease   . GERD (gastroesophageal reflux disease)   . CHF (congestive heart failure)   . Diastolic dysfunction   . History of GI bleed   . History of pacemaker     Medtronic Adapta ADDro1  . Kidney stone   . Peptic ulcer disease   . BPH (benign prostatic hypertrophy)   . 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 Hyperlipidemia   . History of cerebrovascular disease     Past Surgical History  Procedure Date  . Pacemaker insertion     Medtronic Adapta ADDro1  . Cholecystectomy   . Total knee arthroplasty     History   Social History  . Marital Status: Married    Spouse Name: N/A  Number of Children: N/A  . Years of Education: N/A   Occupational History  . Retired    Social History Main Topics  . Smoking status: Former Games developer  . Smokeless tobacco: Not on file  . Alcohol Use: No  . Drug Use: Not on file  . Sexually Active: Not on file   Other Topics Concern  . Not on file   Social History Narrative   Married for 20 years2 children and 3 grandchildren    ROS: no fevers or chills, productive cough, hemoptysis, dysphasia, odynophagia, melena, hematochezia, dysuria, hematuria, rash, seizure activity, orthopnea, PND, pedal edema, claudication. Remaining systems are negative.  Physical Exam: Well-developed well-nourished in no acute distress.  Skin is warm and dry.  HEENT is normal.  Neck is supple. No thyromegaly.  Chest is clear to auscultation with normal expansion.  Cardiovascular exam is regular rate and rhythm. 2/6 systolic murmur left sternal  border Abdominal exam nontender or distended. No masses palpated. Extremities show 1+ edema. neuro grossly intact

## 2010-12-20 NOTE — Patient Instructions (Signed)
Your physician recommends that you schedule a follow-up appointment in: 3 months.  

## 2010-12-20 NOTE — Assessment & Plan Note (Signed)
Continue aspirin and statin. 

## 2010-12-20 NOTE — Assessment & Plan Note (Signed)
Management per electrophysiology. 

## 2010-12-20 NOTE — Assessment & Plan Note (Signed)
Continue aspirin and statin. Followup carotid Doppler in January 2013

## 2010-12-20 NOTE — Assessment & Plan Note (Signed)
Patient's volume status appears to be reasonable. Continue present dose of diuretic. Renal function monitored by nephrology.

## 2011-01-01 ENCOUNTER — Other Ambulatory Visit: Payer: Self-pay | Admitting: Cardiology

## 2011-01-01 MED ORDER — METOPROLOL SUCCINATE ER 25 MG PO TB24: 25.0000 mg | ORAL_TABLET | Freq: Two times a day (BID) | ORAL | Status: DC

## 2011-01-03 ENCOUNTER — Telehealth: Payer: Self-pay | Admitting: Internal Medicine

## 2011-01-03 ENCOUNTER — Other Ambulatory Visit: Payer: Self-pay | Admitting: *Deleted

## 2011-01-03 MED ORDER — METOPROLOL TARTRATE 50 MG PO TABS: 25.0000 mg | ORAL_TABLET | Freq: Two times a day (BID) | ORAL | Status: DC

## 2011-01-03 NOTE — Telephone Encounter (Signed)
Please clarify  Direction / dosage of medication . Metoprolol

## 2011-01-03 NOTE — Telephone Encounter (Signed)
Metoprolol dose and directions clarified to pharmacy.

## 2011-01-24 ENCOUNTER — Ambulatory Visit (INDEPENDENT_AMBULATORY_CARE_PROVIDER_SITE_OTHER): Payer: Medicare Other | Admitting: *Deleted

## 2011-01-24 DIAGNOSIS — Z950 Presence of cardiac pacemaker: Secondary | ICD-10-CM

## 2011-01-24 DIAGNOSIS — I442 Atrioventricular block, complete: Secondary | ICD-10-CM

## 2011-01-29 NOTE — Progress Notes (Signed)
Pacer remote check  

## 2011-02-01 ENCOUNTER — Encounter: Payer: Self-pay | Admitting: *Deleted

## 2011-03-14 ENCOUNTER — Ambulatory Visit (INDEPENDENT_AMBULATORY_CARE_PROVIDER_SITE_OTHER): Payer: Medicare Other | Admitting: Cardiology

## 2011-03-14 ENCOUNTER — Encounter: Payer: Self-pay | Admitting: Cardiology

## 2011-03-14 DIAGNOSIS — I359 Nonrheumatic aortic valve disorder, unspecified: Secondary | ICD-10-CM

## 2011-03-14 DIAGNOSIS — E785 Hyperlipidemia, unspecified: Secondary | ICD-10-CM

## 2011-03-14 DIAGNOSIS — I350 Nonrheumatic aortic (valve) stenosis: Secondary | ICD-10-CM

## 2011-03-14 DIAGNOSIS — I509 Heart failure, unspecified: Secondary | ICD-10-CM

## 2011-03-14 DIAGNOSIS — I10 Essential (primary) hypertension: Secondary | ICD-10-CM

## 2011-03-14 DIAGNOSIS — I2510 Atherosclerotic heart disease of native coronary artery without angina pectoris: Secondary | ICD-10-CM

## 2011-03-14 DIAGNOSIS — Z950 Presence of cardiac pacemaker: Secondary | ICD-10-CM

## 2011-03-14 DIAGNOSIS — I5032 Chronic diastolic (congestive) heart failure: Secondary | ICD-10-CM

## 2011-03-14 DIAGNOSIS — N289 Disorder of kidney and ureter, unspecified: Secondary | ICD-10-CM

## 2011-03-14 DIAGNOSIS — I6529 Occlusion and stenosis of unspecified carotid artery: Secondary | ICD-10-CM

## 2011-03-14 NOTE — Patient Instructions (Signed)
Your physician wants you to follow-up in: 9 MONTHS You will receive a reminder letter in the mail two months in advance. If you don't receive a letter, please call our office to schedule the follow-up appointment.  

## 2011-03-14 NOTE — Assessment & Plan Note (Signed)
Continue aspirin and statin. Followup carotid Dopplers January 2013. 

## 2011-03-14 NOTE — Assessment & Plan Note (Signed)
Management her EP.

## 2011-03-14 NOTE — Assessment & Plan Note (Signed)
followup echocardiogram when he returns in 9 months.

## 2011-03-14 NOTE — Assessment & Plan Note (Signed)
Continue aspirin and statin. 

## 2011-03-14 NOTE — Progress Notes (Signed)
HPI: [name] is a very pleasant gentleman who has a history of congestive heart failure, felt secondary to diastolic dysfunction; hypertension; hyperlipidemia; mild coronary artery disease; and cerebrovascular disease. He also has had a previous pacemaker placed in December 2008 following a cardiac arrest at [facility]. This was placed at [facility] and apparently was bradycardia mediated. His most recent cardiac catheterization here was in April 2005. At that time, he had nonobstructive coronary artery disease, but he did have elevated right and left filling pressures. Last Myoview was performed on February 05, 2007. Ejection fraction was 65% and the perfusion was normal. His most recent echocardiogram was performed in February of 2011 and showed normal LV function, mild aortic stenosis with a mean gradient of 9 mmHg, mild mitral and tricuspid regurgitation and mild biatrial enlargement. Carotid Dopplers in Jan 2012 showed 60-79% left and 0-39% right stenosis. Followup is recommended in one year. I last saw him in June of 2012. Since then, he has some dyspnea on exertion but no orthopnea or PND. His pedal edema is reasonably well controlled with his diuretic. No chest pain, palpitations or syncope.   Current Outpatient Prescriptions  Medication Sig Dispense Refill  . allopurinol (ZYLOPRIM) 300 MG tablet Take 300 mg by mouth daily.        Marland Kitchen aspirin 81 MG tablet Take 81 mg by mouth daily.        Marland Kitchen atorvastatin (LIPITOR) 80 MG tablet 1/2 tab po qd      . citalopram (CELEXA) 20 MG tablet Take 10 mg by mouth daily.        . finasteride (PROSCAR) 5 MG tablet Take 5 mg by mouth daily.        . flunisolide (NASALIDE) 0.025 % SOLN Inhale 2 sprays into the lungs 2 (two) times daily.        . furosemide (LASIX) 40 MG tablet Take 40 mg by mouth daily.       . metoprolol (LOPRESSOR) 50 MG tablet Take 0.5 tablets (25 mg total) by mouth 2 (two) times daily.  60 tablet  11  . Multiple Vitamins-Minerals  (OCUVITE EXTRA) TABS Take 2 tablets by mouth 2 (two) times daily.        . pantoprazole (PROTONIX) 40 MG tablet Take 40 mg by mouth 2 (two) times daily.        . Polyethylene Glycol 3350 LIQD daily.        . vitamin B-12 (CYANOCOBALAMIN) 100 MCG tablet Take 50 mcg by mouth daily.           Past Medical History  Diagnosis Date  . Osteoarthritis   . Gout   . Hypertension   . History of coronary artery disease   . GERD (gastroesophageal reflux disease)   . CHF (congestive heart failure)   . Diastolic dysfunction   . History of GI bleed   . History of pacemaker     Medtronic Adapta ADDro1  . Kidney stone   . Peptic ulcer disease   . BPH (benign prostatic hypertrophy)   . 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 Hyperlipidemia   . History of cerebrovascular disease     Past Surgical History  Procedure Date  . Pacemaker insertion     Medtronic Adapta ADDro1  . Cholecystectomy   . Total knee arthroplasty     History   Social History  . Marital Status: Married    Spouse Name: N/A    Number of  Children: N/A  . Years of Education: N/A   Occupational History  . Retired    Social History Main Topics  . Smoking status: Former Games developer  . Smokeless tobacco: Not on file  . Alcohol Use: No  . Drug Use: Not on file  . Sexually Active: Not on file   Other Topics Concern  . Not on file   Social History Narrative   Married for 20 years2 children and 3 grandchildren    ROS: no fevers or chills, productive cough, hemoptysis, dysphasia, odynophagia, melena, hematochezia, dysuria, hematuria, rash, seizure activity, orthopnea, PND, claudication. Remaining systems are negative.  Physical Exam: Well-developed well-nourished in no acute distress.  Skin is warm and dry.  HEENT is normal.  Neck is supple. No thyromegaly.  Chest is clear to auscultation with normal expansion.  Cardiovascular exam is regular rate and rhythm. 2/6 systolic murmur. S2 is not  diminished. Abdominal exam nontender or distended. No masses palpated. Extremities show trace will edema. neuro grossly intact  ECG AV paced.

## 2011-03-14 NOTE — Assessment & Plan Note (Signed)
Euvolemic on examination. Continue present dose of diuretic. 

## 2011-03-14 NOTE — Assessment & Plan Note (Signed)
Blood pressure controlled. Continue present medications. 

## 2011-03-14 NOTE — Assessment & Plan Note (Signed)
Continue statin. 

## 2011-03-14 NOTE — Assessment & Plan Note (Signed)
Monitored by nephrology. 

## 2011-03-22 LAB — CK TOTAL AND CKMB (NOT AT ARMC)
Relative Index: 3.4 — ABNORMAL HIGH
Relative Index: INVALID

## 2011-03-22 LAB — COMPREHENSIVE METABOLIC PANEL
ALT: 10
ALT: <8
AST: 20
Alkaline Phosphatase: 63
BUN: 15
CO2: 28
CO2: 29
Calcium: 9.1
Chloride: 101
Chloride: 105
Creatinine, Ser: 1.11
Creatinine, Ser: 1.16
Creatinine, Ser: 1.31
GFR calc Af Amer: >60
GFR calc Af Amer: >60
GFR calc non Af Amer: 53 — ABNORMAL LOW
GFR calc non Af Amer: >60
Glucose, Bld: 87
Glucose, Bld: 88
Glucose, Bld: 96
Potassium: 4.3
Sodium: 139
Total Bilirubin: 1.1
Total Bilirubin: 1.3 — ABNORMAL HIGH
Total Protein: 7.1

## 2011-03-22 LAB — CBC
HCT: 38.1 — ABNORMAL LOW
HCT: 41.8
Hemoglobin: 13
Hemoglobin: 14.4
Hemoglobin: 15.2
MCHC: 34
MCHC: 34.3
MCV: 99.1
Platelets: 131 — ABNORMAL LOW
RBC: 3.84 — ABNORMAL LOW
RDW: 13.4
RDW: 13.7
WBC: 7.3

## 2011-03-22 LAB — URINALYSIS, ROUTINE W REFLEX MICROSCOPIC
Glucose, UA: NEGATIVE
Ketones, ur: NEGATIVE
Nitrite: NEGATIVE
Protein, ur: NEGATIVE
pH: 6.5

## 2011-03-22 LAB — TRIGLYCERIDES: Triglycerides: 79

## 2011-03-22 LAB — POCT CARDIAC MARKERS
Myoglobin, poc: 95.9
Operator id: 285491
Troponin i, poc: <0.05

## 2011-03-22 LAB — PROTIME-INR: INR: 1

## 2011-03-22 LAB — LIPASE, BLOOD
Lipase: 198 — ABNORMAL HIGH
Lipase: 622 — ABNORMAL HIGH
Lipase: 76 — ABNORMAL HIGH

## 2011-03-22 LAB — TROPONIN I: Troponin I: 0.01

## 2011-03-22 LAB — AMYLASE: Amylase: 64

## 2011-04-15 LAB — URINALYSIS, ROUTINE W REFLEX MICROSCOPIC
Glucose, UA: NEGATIVE
Hgb urine dipstick: NEGATIVE
Ketones, ur: NEGATIVE
Protein, ur: NEGATIVE
Urobilinogen, UA: 1

## 2011-04-15 LAB — DIFFERENTIAL
Basophils Absolute: 0
Basophils Relative: 0
Eosinophils Absolute: 0
Monocytes Relative: 4
Neutrophils Relative %: 85 — ABNORMAL HIGH

## 2011-04-15 LAB — CBC
RBC: 4.42
WBC: 6.4

## 2011-04-15 LAB — COMPREHENSIVE METABOLIC PANEL
ALT: 11
Alkaline Phosphatase: 58
BUN: 15
CO2: 31
Chloride: 103
Glucose, Bld: 113 — ABNORMAL HIGH
Potassium: 4.4
Sodium: 140
Total Bilirubin: 0.9
Total Protein: 6.7

## 2011-04-15 LAB — CK TOTAL AND CKMB (NOT AT ARMC): Total CK: 87

## 2011-05-03 ENCOUNTER — Encounter: Payer: Self-pay | Admitting: Internal Medicine

## 2011-05-03 ENCOUNTER — Ambulatory Visit (INDEPENDENT_AMBULATORY_CARE_PROVIDER_SITE_OTHER): Payer: Medicare Other | Admitting: Internal Medicine

## 2011-05-03 DIAGNOSIS — I359 Nonrheumatic aortic valve disorder, unspecified: Secondary | ICD-10-CM

## 2011-05-03 DIAGNOSIS — I350 Nonrheumatic aortic (valve) stenosis: Secondary | ICD-10-CM

## 2011-05-03 DIAGNOSIS — I442 Atrioventricular block, complete: Secondary | ICD-10-CM

## 2011-05-03 DIAGNOSIS — Z950 Presence of cardiac pacemaker: Secondary | ICD-10-CM

## 2011-05-03 LAB — PACEMAKER DEVICE OBSERVATION
AL AMPLITUDE: 2.8 mv
BATTERY VOLTAGE: 2.76 V
RV LEAD IMPEDENCE PM: 501 Ohm
VENTRICULAR PACING PM: 78

## 2011-05-03 NOTE — Progress Notes (Signed)
HPI  [name] [name] is a 77 y.o. male seen in followup for pacemaker implanted for sinus node dysfunction that apparently triggered a cardiac arrest. This was done at [facility].  He has a history of congestive heart failure, felt secondary to diastolic dysfunction; hypertension; hyperlipidemia; mild coronary artery disease; and cerebrovascular disease.    His most recent cardiac catheterization here was in April 2005. At that time, he had nonobstructive coronary artery disease, but he did have elevated right and left filling pressures. Last Myoview was performed on February 05, 2007. Ejection fraction was 65% and the perfusion was normal. His most recent echocardiogram was performed in February of 2011 and showed normal LV function, mild aortic stenosis with a mean gradient of 9 mmHg, mild mitral and tricuspid regurgitation and mild biatrial enlargement. Carotid Dopplers in Jan 2012 showed 60-79% left and 0-39% right stenosis.   He notes that when he walks on the treadmill his heart rate goes to "185".( See below.)  After a few minutes of this his heart rate settles down to 99 range. This is associated with some lightheadedness and shortness of breath.   Past Medical History  Diagnosis Date  . Osteoarthritis   . Gout   . Hypertension   . History of coronary artery disease   . GERD (gastroesophageal reflux disease)   . CHF (congestive heart failure)   . Diastolic dysfunction   . History of GI bleed   . History of pacemaker     Medtronic Adapta ADDro1  . Kidney stone   . Peptic ulcer disease   . BPH (benign prostatic hypertrophy)   . 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 Hyperlipidemia   . History of cerebrovascular disease     Past Surgical History  Procedure Date  . Pacemaker insertion     Medtronic Adapta ADDro1  . Cholecystectomy   . Total knee arthroplasty     Current Outpatient Prescriptions  Medication Sig Dispense Refill  . allopurinol (ZYLOPRIM) 300  MG tablet Take 300 mg by mouth daily.        Marland Kitchen aspirin 81 MG tablet Take 81 mg by mouth daily.        Marland Kitchen atorvastatin (LIPITOR) 80 MG tablet 1/2 tab po qd      . citalopram (CELEXA) 20 MG tablet Take 10 mg by mouth daily.        . finasteride (PROSCAR) 5 MG tablet Take 5 mg by mouth daily.        . flunisolide (NASALIDE) 0.025 % SOLN Inhale 2 sprays into the lungs 2 (two) times daily.        . furosemide (LASIX) 40 MG tablet Take 40 mg by mouth daily.       . metoprolol (LOPRESSOR) 50 MG tablet Take 0.5 tablets (25 mg total) by mouth 2 (two) times daily.  60 tablet  11  . Multiple Vitamins-Minerals (OCUVITE EXTRA) TABS Take 2 tablets by mouth 2 (two) times daily.        . pantoprazole (PROTONIX) 40 MG tablet Take 40 mg by mouth 2 (two) times daily.        . Polyethylene Glycol 3350 LIQD daily.        . vitamin B-12 (CYANOCOBALAMIN) 100 MCG tablet Take 50 mcg by mouth daily.          No Known Allergies  Review of Systems negative except from HPI and PMH  Physical Exam Well developed and well nourished in  no acute distress HENT normal E scleral and icterus clear Neck Supple JVP flat; carotids brisk and full Clear to ausculation Regular rate and rhythm, no murmurs gallops or rub Soft with active bowel sounds No clubbing cyanosis and edema Alert and oriented, grossly normal motor and sensory function Skin Warm and Dry  GXT undertaken to explore the issue of HR 180 with exercise  Noted nothing  Likely an error in the hr monitor on his treadmill  Assessment and  Plan

## 2011-05-03 NOTE — Assessment & Plan Note (Signed)
nstable  Not an exclusion for stress testing

## 2011-05-03 NOTE — Assessment & Plan Note (Addendum)
His pacemaker identifies a small number of beats in the 180 been supportive of the observation that he is made. However, no intrinsic rhythm and upper tracking rate and upper sensor rates are both in the 130 range. This suggested there must be a bigeminy kind of a thing that is occurring  We will try reproduces on a treadmill. If this is unsuccessful we will use the arrhythmia detection algorithm--nothing noted    The patient's device was interrogated and the information was fully reviewed.  The device was reprogrammed to  Adjust reate response

## 2011-06-20 ENCOUNTER — Encounter: Payer: Self-pay | Admitting: Internal Medicine

## 2011-07-17 ENCOUNTER — Telehealth: Payer: Self-pay | Admitting: Internal Medicine

## 2011-07-17 NOTE — Telephone Encounter (Signed)
LOv,12,Echo,Pacer Check faxed to [facility]  @ [phone]  07/17/11/KM

## 2011-07-21 ENCOUNTER — Other Ambulatory Visit: Payer: Self-pay | Admitting: Orthopedic Surgery

## 2011-08-01 ENCOUNTER — Encounter: Payer: Medicare Other | Admitting: *Deleted

## 2011-08-05 ENCOUNTER — Encounter: Payer: Self-pay | Admitting: *Deleted

## 2011-08-11 ENCOUNTER — Encounter: Payer: Self-pay | Admitting: Internal Medicine

## 2011-08-12 ENCOUNTER — Other Ambulatory Visit: Payer: Self-pay | Admitting: Cardiology

## 2011-08-12 ENCOUNTER — Ambulatory Visit (INDEPENDENT_AMBULATORY_CARE_PROVIDER_SITE_OTHER): Payer: Medicare Other | Admitting: *Deleted

## 2011-08-12 DIAGNOSIS — I6529 Occlusion and stenosis of unspecified carotid artery: Secondary | ICD-10-CM

## 2011-08-12 DIAGNOSIS — I442 Atrioventricular block, complete: Secondary | ICD-10-CM

## 2011-08-14 ENCOUNTER — Encounter (HOSPITAL_COMMUNITY): Payer: Self-pay | Admitting: Pharmacy Technician

## 2011-08-14 ENCOUNTER — Encounter (HOSPITAL_COMMUNITY): Payer: Self-pay

## 2011-08-14 ENCOUNTER — Encounter (HOSPITAL_COMMUNITY)
Admission: RE | Admit: 2011-08-14 | Discharge: 2011-08-14 | Disposition: A | Discharge status: Home / Self Care | Payer: Medicare Other | Source: Ambulatory Visit | Attending: Orthopedic Surgery | Admitting: Orthopedic Surgery

## 2011-08-14 ENCOUNTER — Encounter (INDEPENDENT_AMBULATORY_CARE_PROVIDER_SITE_OTHER): Payer: Medicare Other | Admitting: Cardiology

## 2011-08-14 ENCOUNTER — Ambulatory Visit (HOSPITAL_COMMUNITY)
Admission: RE | Admit: 2011-08-14 | Discharge: 2011-08-14 | Disposition: A | Discharge status: Home / Self Care | Payer: Medicare Other | Source: Ambulatory Visit | Attending: Orthopedic Surgery | Admitting: Orthopedic Surgery

## 2011-08-14 DIAGNOSIS — I6529 Occlusion and stenosis of unspecified carotid artery: Secondary | ICD-10-CM

## 2011-08-14 DIAGNOSIS — I509 Heart failure, unspecified: Secondary | ICD-10-CM | POA: Insufficient documentation

## 2011-08-14 DIAGNOSIS — Z01812 Encounter for preprocedural laboratory examination: Secondary | ICD-10-CM | POA: Insufficient documentation

## 2011-08-14 DIAGNOSIS — I2510 Atherosclerotic heart disease of native coronary artery without angina pectoris: Secondary | ICD-10-CM | POA: Insufficient documentation

## 2011-08-14 DIAGNOSIS — M1710 Unilateral primary osteoarthritis, unspecified knee: Secondary | ICD-10-CM | POA: Insufficient documentation

## 2011-08-14 DIAGNOSIS — Z01818 Encounter for other preprocedural examination: Secondary | ICD-10-CM | POA: Insufficient documentation

## 2011-08-14 DIAGNOSIS — Z01811 Encounter for preprocedural respiratory examination: Secondary | ICD-10-CM | POA: Insufficient documentation

## 2011-08-14 HISTORY — DX: Depression, unspecified: F32.A

## 2011-08-14 HISTORY — DX: Major depressive disorder, single episode, unspecified: F32.9

## 2011-08-14 LAB — BASIC METABOLIC PANEL
BUN: 31 mg/dL — ABNORMAL HIGH (ref 6–23)
Calcium: 9.3 mg/dL (ref 8.4–10.5)
Creatinine, Ser: 1.6 mg/dL — ABNORMAL HIGH (ref 0.50–1.35)
GFR calc non Af Amer: 38 mL/min — ABNORMAL LOW (ref >90)
Glucose, Bld: 101 mg/dL — ABNORMAL HIGH (ref 70–99)

## 2011-08-14 LAB — APTT: aPTT: 28 seconds (ref 24–37)

## 2011-08-14 LAB — CBC
HCT: 40 % (ref 39.0–52.0)
Hemoglobin: 13.2 g/dL (ref 13.0–17.0)
MCH: 33.7 pg (ref 26.0–34.0)
MCHC: 33 g/dL (ref 30.0–36.0)
MCV: 102 fL — ABNORMAL HIGH (ref 78.0–100.0)

## 2011-08-14 NOTE — H&P (Signed)
NAME:  [name] [name], [name] [name] NO.:  [id number]  MEDICAL RECORD NO.:  [id number]  LOCATION:  PERIO                        FACILITY:  [facility]  PHYSICIAN:  Marlowe Kays, M.D.  DATE OF BIRTH:  [birth year]  DATE OF ADMISSION:  08/28/2011 DATE OF DISCHARGE:                             HISTORY & PHYSICAL   ADMISSION DATE:  Scheduled, August 28, 2011.  CHIEF COMPLAINT:  "Pain in my left knee."  HISTORY OF PRESENT ILLNESS:  This 78 year old white male seen by [place] for continued progressive problems concerning his left knee.  He successfully underwent right total knee replacement arthroplasty in the past, and is highly desirous to have the left knee done.  He is an extremely active gentleman, mentally and physically  younger than his stated age.  His finding from recent interfering with his day-to-day activities as well as the left knee wanting to buckle under him.  After much discussion including the risks and benefits of surgery, we decided to proceed with total knee replacement arthroplasty on the left.  The patient is to see Dr. Olga Millers for ultrasound of his carotids prior to surgery.  Dr. Jarome Matin is his family physician.  PAST MEDICAL HISTORY:  The patient has hypertension.  He has history of congestive heart failure, has a pacemaker.  He also has reflux disease. He had cholecystectomy about 10 years ago.  ALLERGIES:  He has no medical allergies.  CURRENT MEDICATIONS: 1. Allopurinol 30 mg tablets daily. 2. Furosemide 40 mg tablet daily. 3. Metoprolol tartrate 50 mg tablets daily. 4. Polyethylene glycol, 3350. 5. Aspirin 81 mg daily. 6. Atorvastatin calcium daily. 7. Finasteride daily. 8. Vitamin complex daily. 9. Protonix daily. 10.Vitamin B12.  FAMILY HISTORY:  Positive for leukemia and diabetes.  SOCIAL HISTORY:  The patient is retired, has no intake of alcohol, tobacco products.  He lives alone.  Sister lives next door.  REVIEW OF SYSTEMS:   CNS:  No seizures or paralysis, numbness, double vision.  RESPIRATORY:  No productive cough.  No hemoptysis.  No shortness of breath.  CARDIOVASCULAR:  No chest pain.  No angina.  No orthopnea.  GASTROINTESTINAL:  No nausea, vomiting, or bloody stool. GENITOURINARY:  No discharge, dysuria, or hematuria.  PHYSICAL EXAMINATION:  GENERAL:  Alert, cooperative, friendly, fully alert, 78 year old man who walks decidedly with a limp to the right. VITAL SIGNS:  Blood pressure is 128/82. HEENT:  Normocephalic.  PERRLA, EOM intact.  Oropharynx is clear. CHEST:  Clear to auscultation.  No rhonchi, no rales. HEART:  Regular rate and rhythm.  No murmurs are heard. ABDOMEN:  Soft, nontender.  Liver, spleen not felt. GENITALIA/RECTAL:  Not done, not pertinent to present illness. EXTREMITIES:  The patient has excellent range of motion in the right knee.  He has pain on range of motion in the left knee.  Obvious varus deformity and crepitus.  ADMITTING DIAGNOSES: 1. End-stage osteoarthritis of his left knee. 2. Pacemaker. 3. History of congestive heart failure. 4. Gout.  PLAN:  The patient will be admitted for total knee replacement arthroplasty on the left.  He plans to go to the rehab facility near his home, which is [facility] in [place], [place].  His wife is currently an Alzheimer patient there.  Should we have any medical problems, we will certainly call on Dr. Jarome Matin for his help with this patient's management.     Kaeleen Odom L. Cherlynn June.   ______________________________ Marlowe Kays, M.D.    DLU/MEDQ  D:  08/13/2011  T:  08/14/2011  Job:  [id number]  cc:   [name] [name]. [name] [name], M.D. Fax: [phone]  Marlowe Kays, M.D. Fax: [phone]

## 2011-08-14 NOTE — Patient Instructions (Addendum)
20 [name] [name]  08/14/2011   Your procedure is scheduled on:  08/28/11  Report to [facility] at 5:30 AM.  Call this number if you have problems the morning of surgery: [phone]   Remember:   Do not eat food:After Midnight.  May have clear liquids: up to 4 Hrs. before arrival.( 6 HRS BEFORE SURGERY)  Clear liquids include soda, tea, black coffee, apple or grape juice, broth.  Take these medicines the morning of surgery with A SIP OF WATER: ALLOPURINOL / LIPITOR / METOPROLOL / PROTONIX   Do not wear jewelry, make-up or nail polish.  Do not wear lotions, powders, or perfumes.   Do not shave underarms or legs 48 hours prior to surgery (men may shave face)  Do not bring valuables to the hospital.  Contacts, dentures or bridgework may not be worn into surgery.  Leave suitcase in the car. After surgery it may be brought to your room.  For patients admitted to the hospital, checkout time is 11:00 AM the day of discharge.   Patients discharged the day of surgery will not be allowed to drive home.  Name and phone number of your driver:   Special Instructions: CHG Shower Use Special Wash: 1/2 bottle night before surgery and 1/2 bottle morning of surgery.   Please read over the following fact sheets that you were given: MRSA Information  20 [name] [name]  08/14/2011

## 2011-08-16 NOTE — Pre-Procedure Instructions (Signed)
I spoke  With Dr. Simonne Come concerning abnormal BMET - states is ok for OR

## 2011-08-18 LAB — REMOTE PACEMAKER DEVICE
AL THRESHOLD: 0.75 V
BATTERY VOLTAGE: 2.75 V
RV LEAD IMPEDENCE PM: 556 Ohm
VENTRICULAR PACING PM: 100

## 2011-08-21 ENCOUNTER — Ambulatory Visit (INDEPENDENT_AMBULATORY_CARE_PROVIDER_SITE_OTHER): Payer: Medicare Other | Admitting: Cardiology

## 2011-08-21 ENCOUNTER — Encounter: Payer: Self-pay | Admitting: Cardiology

## 2011-08-21 VITALS — BP 142/92 | HR 85 | Wt 189.0 lb

## 2011-08-21 DIAGNOSIS — I509 Heart failure, unspecified: Secondary | ICD-10-CM

## 2011-08-21 DIAGNOSIS — I6529 Occlusion and stenosis of unspecified carotid artery: Secondary | ICD-10-CM

## 2011-08-21 DIAGNOSIS — I10 Essential (primary) hypertension: Secondary | ICD-10-CM

## 2011-08-21 DIAGNOSIS — E785 Hyperlipidemia, unspecified: Secondary | ICD-10-CM

## 2011-08-21 DIAGNOSIS — I5032 Chronic diastolic (congestive) heart failure: Secondary | ICD-10-CM

## 2011-08-21 DIAGNOSIS — Z950 Presence of cardiac pacemaker: Secondary | ICD-10-CM

## 2011-08-21 DIAGNOSIS — I350 Nonrheumatic aortic (valve) stenosis: Secondary | ICD-10-CM

## 2011-08-21 DIAGNOSIS — Z01810 Encounter for preprocedural cardiovascular examination: Secondary | ICD-10-CM

## 2011-08-21 DIAGNOSIS — I2510 Atherosclerotic heart disease of native coronary artery without angina pectoris: Secondary | ICD-10-CM

## 2011-08-21 NOTE — Assessment & Plan Note (Signed)
Repeat echocardiogram. However it does not sound severe on examination.

## 2011-08-21 NOTE — Patient Instructions (Signed)
Your physician wants you to follow-up in: 6 MONTHS You will receive a reminder letter in the mail two months in advance. If you don't receive a letter, please call our office to schedule the follow-up appointment.   Your physician has requested that you have an echocardiogram. Echocardiography is a painless test that uses sound waves to create images of your heart. It provides your doctor with information about the size and shape of your heart and how well your heart's chambers and valves are working. This procedure takes approximately one hour. There are no restrictions for this procedure.   

## 2011-08-21 NOTE — Assessment & Plan Note (Signed)
Continue present blood pressure medications. 

## 2011-08-21 NOTE — Assessment & Plan Note (Signed)
Management per electrophysiology. 

## 2011-08-21 NOTE — Assessment & Plan Note (Signed)
Continue statin. 

## 2011-08-21 NOTE — Progress Notes (Signed)
HPI:[name] [name] is a very pleasant gentleman who has a history of congestive heart failure, felt secondary to diastolic dysfunction; hypertension; hyperlipidemia; mild coronary artery disease; and cerebrovascular disease. He also has had a previous pacemaker placed in December 2008 following a cardiac arrest at [facility]. This was placed at [facility] and apparently was bradycardia mediated. His most recent cardiac catheterization here was in April 2005. At that time, he had nonobstructive coronary artery disease, but he did have elevated right and left filling pressures. Last Myoview was performed on February 05, 2007. Ejection fraction was 65% and the perfusion was normal. His most recent echocardiogram was performed in February of 2011 and showed normal LV function, mild aortic stenosis with a mean gradient of 9 mmHg, mild mitral and tricuspid regurgitation and mild biatrial enlargement. Carotid Dopplers in Jan 2012 showed 60-79% left and 40-59% right stenosis. Followup is recommended in six months. I last saw him in Sept of 2012. Since then, he has some dyspnea on exertion but no orthopnea or PND. His pedal edema is well controlled with his diuretic. No chest pain, palpitations or syncope.   Current Outpatient Prescriptions  Medication Sig Dispense Refill  . allopurinol (ZYLOPRIM) 300 MG tablet Take 300 mg by mouth daily.       Marland Kitchen aspirin 81 MG tablet Take 81 mg by mouth daily before breakfast.       . atorvastatin (LIPITOR) 80 MG tablet Take 40 mg by mouth every morning.       . finasteride (PROSCAR) 5 MG tablet Take 5 mg by mouth daily.       . furosemide (LASIX) 40 MG tablet Take 40 mg by mouth daily after breakfast.       . metoprolol succinate (TOPROL-XL) 25 MG 24 hr tablet Take 25 mg by mouth 2 (two) times daily.      . Multiple Vitamins-Minerals (OCUVITE EXTRA) TABS Take 1 tablet by mouth 2 (two) times daily.       . pantoprazole (PROTONIX) 40 MG tablet Take 40 mg by mouth 2 (two)  times daily.       . Polyethylene Glycol 3350 LIQD Take 17 g by mouth daily.       . sertraline (ZOLOFT) 50 MG tablet Take 50 mg by mouth at bedtime.         Past Medical History  Diagnosis Date  . Osteoarthritis   . Gout   . Hypertension   . History of coronary artery disease   . GERD (gastroesophageal reflux disease)   . CHF (congestive heart failure)   . Diastolic dysfunction   . History of GI bleed   . History of pacemaker     Medtronic Adapta ADDro1  . Peptic ulcer disease   . BPH (benign prostatic hypertrophy)   . 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 Hyperlipidemia   . History of cerebrovascular disease   . Kidney stone   . Depression     Past Surgical History  Procedure Date  . Pacemaker insertion     Medtronic Adapta ADDro1  . Cholecystectomy   . Total knee arthroplasty   . Hernia repair   . Tonsillectomy     History   Social History  . Marital Status: Married    Spouse Name: N/A    Number of Children: N/A  . Years of Education: N/A   Occupational History  . Retired    Social History Main Topics  . Smoking status:  Former Smoker  . Smokeless tobacco: Former Neurosurgeon    Quit date: 08/13/1976  . Alcohol Use: No  . Drug Use: No  . Sexually Active: Not on file   Other Topics Concern  . Not on file   Social History Narrative   Married for 20 years2 children and 3 grandchildren    ROS: Left knee arthralgias but no fevers or chills, productive cough, hemoptysis, dysphasia, odynophagia, melena, hematochezia, dysuria, hematuria, rash, seizure activity, orthopnea, PND, pedal edema, claudication. Remaining systems are negative.  Physical Exam: Well-developed well-nourished in no acute distress.  Skin is warm and dry.  HEENT is normal.  Neck is supple. No thyromegaly.  Chest is clear to auscultation with normal expansion.  Cardiovascular exam is regular rate and rhythm. 2/6 systolic murmur left sternal border. S2 is not  diminished. Abdominal exam nontender or distended. No masses palpated. Extremities show no edema. neuro grossly intact  ECG AV paced rhythm

## 2011-08-21 NOTE — Assessment & Plan Note (Signed)
Continue Lasix. Euvolemic on examination.

## 2011-08-21 NOTE — Assessment & Plan Note (Signed)
Continue aspirin and statin. Followup carotid Dopplers August 2013. 

## 2011-08-21 NOTE — Assessment & Plan Note (Signed)
Patient has had no symptoms of chest pain or worsening shortness of breath. He may proceed with surgery without further ischemia evaluation. Continue present medications pre-and postoperatively. Watch volume status as he has had diastolic dysfunction in the past.

## 2011-08-21 NOTE — Assessment & Plan Note (Signed)
Nonobstructive from previous catheterization. Previous Myoview negative. Continue aspirin and statin.

## 2011-08-22 ENCOUNTER — Other Ambulatory Visit: Payer: Self-pay

## 2011-08-22 ENCOUNTER — Ambulatory Visit (HOSPITAL_COMMUNITY): Payer: Medicare Other | Attending: Cardiology

## 2011-08-22 DIAGNOSIS — I10 Essential (primary) hypertension: Secondary | ICD-10-CM | POA: Insufficient documentation

## 2011-08-22 DIAGNOSIS — I080 Rheumatic disorders of both mitral and aortic valves: Secondary | ICD-10-CM | POA: Insufficient documentation

## 2011-08-22 DIAGNOSIS — I2510 Atherosclerotic heart disease of native coronary artery without angina pectoris: Secondary | ICD-10-CM | POA: Insufficient documentation

## 2011-08-22 DIAGNOSIS — E785 Hyperlipidemia, unspecified: Secondary | ICD-10-CM | POA: Insufficient documentation

## 2011-08-22 DIAGNOSIS — I509 Heart failure, unspecified: Secondary | ICD-10-CM | POA: Insufficient documentation

## 2011-08-22 DIAGNOSIS — I079 Rheumatic tricuspid valve disease, unspecified: Secondary | ICD-10-CM | POA: Insufficient documentation

## 2011-08-23 ENCOUNTER — Ambulatory Visit: Payer: Medicare Other | Admitting: Nurse Practitioner

## 2011-08-23 NOTE — Progress Notes (Signed)
PPM remote 

## 2011-08-26 NOTE — Pre-Procedure Instructions (Signed)
08-26-11 1025 AM pt. Notified of surgery time changed from 0730am, to now 0945AM- will arrive to Short Stay by 0730AM. All other instructions unchanged.W. Kennon Portela

## 2011-08-28 ENCOUNTER — Encounter (HOSPITAL_COMMUNITY): Payer: Self-pay | Admitting: Anesthesiology

## 2011-08-28 ENCOUNTER — Inpatient Hospital Stay (HOSPITAL_COMMUNITY): Payer: Medicare Other | Admitting: Anesthesiology

## 2011-08-28 ENCOUNTER — Encounter (HOSPITAL_COMMUNITY)
Admission: RE | Disposition: A | Discharge status: Skilled Nursing Facility | Payer: Self-pay | Source: Ambulatory Visit | Attending: Orthopedic Surgery

## 2011-08-28 ENCOUNTER — Encounter (HOSPITAL_COMMUNITY): Payer: Self-pay | Admitting: *Deleted

## 2011-08-28 ENCOUNTER — Inpatient Hospital Stay (HOSPITAL_COMMUNITY)
Admission: RE | Admit: 2011-08-28 | Discharge: 2011-08-31 | DRG: 470 | Disposition: A | Discharge status: Skilled Nursing Facility | Payer: Medicare Other | Source: Ambulatory Visit | Attending: Orthopedic Surgery | Admitting: Orthopedic Surgery

## 2011-08-28 ENCOUNTER — Inpatient Hospital Stay (HOSPITAL_COMMUNITY): Payer: Medicare Other

## 2011-08-28 ENCOUNTER — Encounter: Payer: Self-pay | Admitting: *Deleted

## 2011-08-28 DIAGNOSIS — I2510 Atherosclerotic heart disease of native coronary artery without angina pectoris: Secondary | ICD-10-CM | POA: Diagnosis present

## 2011-08-28 DIAGNOSIS — D649 Anemia, unspecified: Secondary | ICD-10-CM | POA: Diagnosis not present

## 2011-08-28 DIAGNOSIS — Z96659 Presence of unspecified artificial knee joint: Secondary | ICD-10-CM

## 2011-08-28 DIAGNOSIS — Z950 Presence of cardiac pacemaker: Secondary | ICD-10-CM

## 2011-08-28 DIAGNOSIS — I509 Heart failure, unspecified: Secondary | ICD-10-CM | POA: Diagnosis present

## 2011-08-28 DIAGNOSIS — I9589 Other hypotension: Secondary | ICD-10-CM | POA: Diagnosis not present

## 2011-08-28 DIAGNOSIS — M1710 Unilateral primary osteoarthritis, unspecified knee: Principal | ICD-10-CM | POA: Diagnosis present

## 2011-08-28 HISTORY — PX: TOTAL KNEE ARTHROPLASTY: SHX125

## 2011-08-28 LAB — ABO/RH: ABO/RH(D): A POS

## 2011-08-28 SURGERY — ARTHROPLASTY, KNEE, TOTAL
Anesthesia: Spinal | Site: Knee | Laterality: Left | Wound class: Clean

## 2011-08-28 MED ORDER — ONDANSETRON HCL 4 MG/2ML IJ SOLN: 4.0000 mg | Freq: Four times a day (QID) | INTRAMUSCULAR | Status: DC | PRN

## 2011-08-28 MED ORDER — ALLOPURINOL 300 MG PO TABS
300.0000 mg | ORAL_TABLET | Freq: Every day | ORAL | Status: DC
  Administered 2011-08-29 – 2011-08-31 (×3): 300 mg via ORAL
  Filled 2011-08-28 (×4): qty 1

## 2011-08-28 MED ORDER — METOPROLOL SUCCINATE ER 25 MG PO TB24
25.0000 mg | ORAL_TABLET | Freq: Two times a day (BID) | ORAL | Status: DC
  Administered 2011-08-29 – 2011-08-30 (×3): 25 mg via ORAL
  Filled 2011-08-28 (×7): qty 1

## 2011-08-28 MED ORDER — DIPHENHYDRAMINE HCL 12.5 MG/5ML PO ELIX: 12.5000 mg | ORAL_SOLUTION | Freq: Four times a day (QID) | ORAL | Status: DC | PRN

## 2011-08-28 MED ORDER — FUROSEMIDE 40 MG PO TABS
40.0000 mg | ORAL_TABLET | Freq: Every day | ORAL | Status: DC
  Administered 2011-08-29 – 2011-08-30 (×2): 40 mg via ORAL
  Filled 2011-08-28 (×4): qty 1

## 2011-08-28 MED ORDER — MENTHOL 3 MG MT LOZG
1.0000 | LOZENGE | OROMUCOSAL | Status: DC | PRN
  Filled 2011-08-28: qty 9

## 2011-08-28 MED ORDER — METHOCARBAMOL 500 MG PO TABS
500.0000 mg | ORAL_TABLET | Freq: Four times a day (QID) | ORAL | Status: DC | PRN
  Administered 2011-08-29 (×2): 500 mg via ORAL
  Filled 2011-08-28 (×2): qty 1

## 2011-08-28 MED ORDER — PHENYLEPHRINE HCL 10 MG/ML IJ SOLN
20.0000 mg | INTRAVENOUS | Status: DC | PRN
  Administered 2011-08-28: 10 ug/min via INTRAVENOUS

## 2011-08-28 MED ORDER — SODIUM CHLORIDE 0.9 % IV SOLN
INTRAVENOUS | Status: DC
  Administered 2011-08-28: 21:00:00 via INTRAVENOUS
  Administered 2011-08-28: 1000 mL via INTRAVENOUS
  Administered 2011-08-29: 14:00:00 via INTRAVENOUS

## 2011-08-28 MED ORDER — PROPOFOL 10 MG/ML IV BOLUS
INTRAVENOUS | Status: DC | PRN
  Administered 2011-08-28: 25 mg via INTRAVENOUS

## 2011-08-28 MED ORDER — HYDROMORPHONE HCL PF 1 MG/ML IJ SOLN
0.2500 mg | INTRAMUSCULAR | Status: DC | PRN
  Administered 2011-08-28 (×2): 0.25 mg via INTRAVENOUS

## 2011-08-28 MED ORDER — PROMETHAZINE HCL 25 MG/ML IJ SOLN
6.2500 mg | INTRAMUSCULAR | Status: DC | PRN
  Administered 2011-08-28: 6.25 mg via INTRAVENOUS

## 2011-08-28 MED ORDER — MIDAZOLAM HCL 5 MG/5ML IJ SOLN
INTRAMUSCULAR | Status: DC | PRN
  Administered 2011-08-28: 2 mg via INTRAVENOUS

## 2011-08-28 MED ORDER — METOCLOPRAMIDE HCL 5 MG/ML IJ SOLN
5.0000 mg | Freq: Three times a day (TID) | INTRAMUSCULAR | Status: DC | PRN
  Administered 2011-08-28: 5 mg via INTRAVENOUS
  Filled 2011-08-28: qty 2

## 2011-08-28 MED ORDER — DIPHENHYDRAMINE HCL 50 MG/ML IJ SOLN: 12.5000 mg | Freq: Four times a day (QID) | INTRAMUSCULAR | Status: DC | PRN

## 2011-08-28 MED ORDER — METHOCARBAMOL 100 MG/ML IJ SOLN
500.0000 mg | Freq: Four times a day (QID) | INTRAMUSCULAR | Status: DC | PRN
  Administered 2011-08-28: 500 mg via INTRAVENOUS
  Filled 2011-08-28 (×3): qty 5

## 2011-08-28 MED ORDER — METOCLOPRAMIDE HCL 10 MG PO TABS: 5.0000 mg | ORAL_TABLET | Freq: Three times a day (TID) | ORAL | Status: DC | PRN

## 2011-08-28 MED ORDER — PHENOL 1.4 % MT LIQD
1.0000 | OROMUCOSAL | Status: DC | PRN
  Filled 2011-08-28: qty 177

## 2011-08-28 MED ORDER — FENTANYL CITRATE 0.05 MG/ML IJ SOLN
INTRAMUSCULAR | Status: DC | PRN
  Administered 2011-08-28: 50 ug via INTRAVENOUS

## 2011-08-28 MED ORDER — HYDROCODONE-ACETAMINOPHEN 5-325 MG PO TABS
1.0000 | ORAL_TABLET | ORAL | Status: DC | PRN
  Administered 2011-08-29 – 2011-08-30 (×4): 1 via ORAL
  Administered 2011-08-31: 2 via ORAL
  Filled 2011-08-28 (×4): qty 1
  Filled 2011-08-28: qty 2

## 2011-08-28 MED ORDER — NALOXONE HCL 0.4 MG/ML IJ SOLN
0.4000 mg | INTRAMUSCULAR | Status: DC | PRN
  Administered 2011-08-28: 0.2 mg via INTRAVENOUS
  Filled 2011-08-28: qty 1

## 2011-08-28 MED ORDER — CEFAZOLIN SODIUM-DEXTROSE 2-3 GM-% IV SOLR
2.0000 g | Freq: Four times a day (QID) | INTRAVENOUS | Status: AC
  Administered 2011-08-28 – 2011-08-29 (×3): 2 g via INTRAVENOUS
  Filled 2011-08-28 (×3): qty 50

## 2011-08-28 MED ORDER — POLYETHYLENE GLYCOL 3350 LIQD: 17.0000 g | Freq: Every day | Status: DC

## 2011-08-28 MED ORDER — ONDANSETRON HCL 4 MG/2ML IJ SOLN
4.0000 mg | Freq: Four times a day (QID) | INTRAMUSCULAR | Status: DC | PRN
  Administered 2011-08-28: 4 mg via INTRAVENOUS
  Filled 2011-08-28: qty 2

## 2011-08-28 MED ORDER — POLYETHYLENE GLYCOL 3350 17 G PO PACK
17.0000 g | PACK | Freq: Every day | ORAL | Status: DC
  Administered 2011-08-29 – 2011-08-31 (×3): 17 g via ORAL
  Filled 2011-08-28 (×4): qty 1

## 2011-08-28 MED ORDER — PANTOPRAZOLE SODIUM 40 MG PO TBEC
40.0000 mg | DELAYED_RELEASE_TABLET | Freq: Two times a day (BID) | ORAL | Status: DC
  Administered 2011-08-29 – 2011-08-31 (×5): 40 mg via ORAL
  Filled 2011-08-28 (×6): qty 1

## 2011-08-28 MED ORDER — OCUVITE EXTRA PO TABS: 1.0000 | ORAL_TABLET | Freq: Two times a day (BID) | ORAL | Status: DC

## 2011-08-28 MED ORDER — FINASTERIDE 5 MG PO TABS
5.0000 mg | ORAL_TABLET | Freq: Every day | ORAL | Status: DC
  Administered 2011-08-29 – 2011-08-31 (×3): 5 mg via ORAL
  Filled 2011-08-28 (×4): qty 1

## 2011-08-28 MED ORDER — HYDROMORPHONE HCL PF 1 MG/ML IJ SOLN
0.5000 mg | INTRAMUSCULAR | Status: DC | PRN
  Administered 2011-08-29: 0.5 mg via INTRAVENOUS
  Filled 2011-08-28 (×2): qty 1

## 2011-08-28 MED ORDER — CEFAZOLIN SODIUM-DEXTROSE 2-3 GM-% IV SOLR
2.0000 g | Freq: Once | INTRAVENOUS | Status: AC
  Administered 2011-08-28: 2 g via INTRAVENOUS

## 2011-08-28 MED ORDER — LACTATED RINGERS IV SOLN
INTRAVENOUS | Status: DC
  Administered 2011-08-28: 12:00:00 via INTRAVENOUS
  Administered 2011-08-28: 1000 mL via INTRAVENOUS

## 2011-08-28 MED ORDER — PROSIGHT PO TABS
1.0000 | ORAL_TABLET | Freq: Two times a day (BID) | ORAL | Status: DC
  Administered 2011-08-29 – 2011-08-31 (×5): 1 via ORAL
  Filled 2011-08-28 (×8): qty 1

## 2011-08-28 MED ORDER — RIVAROXABAN 10 MG PO TABS
10.0000 mg | ORAL_TABLET | Freq: Every day | ORAL | Status: DC
  Administered 2011-08-29: 10 mg via ORAL
  Filled 2011-08-28: qty 1

## 2011-08-28 MED ORDER — ACETAMINOPHEN 650 MG RE SUPP: 650.0000 mg | Freq: Four times a day (QID) | RECTAL | Status: DC | PRN

## 2011-08-28 MED ORDER — ACETAMINOPHEN 325 MG PO TABS
650.0000 mg | ORAL_TABLET | Freq: Four times a day (QID) | ORAL | Status: DC | PRN
  Administered 2011-08-28 – 2011-08-29 (×2): 650 mg via ORAL
  Filled 2011-08-28 (×2): qty 2

## 2011-08-28 MED ORDER — OXYCODONE-ACETAMINOPHEN 5-325 MG PO TABS
1.0000 | ORAL_TABLET | ORAL | Status: DC | PRN
  Administered 2011-08-29: 2 via ORAL
  Administered 2011-08-29: 1 via ORAL
  Administered 2011-08-29 (×2): 2 via ORAL
  Administered 2011-08-29: 1 via ORAL
  Administered 2011-08-30 (×3): 2 via ORAL
  Filled 2011-08-28 (×2): qty 1
  Filled 2011-08-28 (×6): qty 2

## 2011-08-28 MED ORDER — SODIUM CHLORIDE 0.9 % IR SOLN
Status: DC | PRN
  Administered 2011-08-28: 1000 mL

## 2011-08-28 MED ORDER — ATORVASTATIN CALCIUM 40 MG PO TABS
40.0000 mg | ORAL_TABLET | Freq: Every day | ORAL | Status: DC
  Administered 2011-08-29 – 2011-08-31 (×3): 40 mg via ORAL
  Filled 2011-08-28 (×4): qty 1

## 2011-08-28 MED ORDER — HYDROMORPHONE HCL 1 MG/ML PO LIQD: 0.5000 mg | ORAL | Status: DC | PRN

## 2011-08-28 MED ORDER — SODIUM CHLORIDE 0.9 % IJ SOLN: 9.0000 mL | INTRAMUSCULAR | Status: DC | PRN

## 2011-08-28 MED ORDER — SERTRALINE HCL 50 MG PO TABS
50.0000 mg | ORAL_TABLET | Freq: Every day | ORAL | Status: DC
Start: 2011-08-28 — End: 2011-08-31
  Administered 2011-08-28 – 2011-08-30 (×3): 50 mg via ORAL
  Filled 2011-08-28 (×4): qty 1

## 2011-08-28 MED ORDER — SODIUM CHLORIDE 0.9 % IV BOLUS (SEPSIS)
500.0000 mL | Freq: Once | INTRAVENOUS | Status: AC
  Administered 2011-08-28: 500 mL via INTRAVENOUS

## 2011-08-28 MED ORDER — HYDROMORPHONE 0.3 MG/ML IV SOLN
INTRAVENOUS | Status: DC
  Administered 2011-08-28: 0.3 mL via INTRAVENOUS
  Administered 2011-08-28: 2.79 mg via INTRAVENOUS

## 2011-08-28 MED ORDER — BUPIVACAINE IN DEXTROSE 0.75-8.25 % IT SOLN
INTRATHECAL | Status: DC | PRN
  Administered 2011-08-28: 1.8 mL via INTRATHECAL

## 2011-08-28 MED ORDER — POVIDONE-IODINE 7.5 % EX SOLN: Freq: Once | CUTANEOUS | Status: DC

## 2011-08-28 MED ORDER — ONDANSETRON HCL 4 MG PO TABS: 4.0000 mg | ORAL_TABLET | Freq: Four times a day (QID) | ORAL | Status: DC | PRN

## 2011-08-28 MED ORDER — PROPOFOL 10 MG/ML IV EMUL
INTRAVENOUS | Status: DC | PRN
  Administered 2011-08-28: 120 ug/kg/min via INTRAVENOUS

## 2011-08-28 MED ORDER — SODIUM CHLORIDE 0.9 % IR SOLN
Status: DC | PRN
  Administered 2011-08-28: 3000 mL

## 2011-08-28 SURGICAL SUPPLY — 57 items
BAG SPEC THK2 15X12 ZIP CLS (MISCELLANEOUS)
BAG ZIPLOCK 12X15 (MISCELLANEOUS) ×2 IMPLANT
BANDAGE ACE 4 STERILE (GAUZE/BANDAGES/DRESSINGS) ×2 IMPLANT
BANDAGE ELASTIC 4 VELCRO ST LF (GAUZE/BANDAGES/DRESSINGS) ×2 IMPLANT
BANDAGE ELASTIC 6 VELCRO ST LF (GAUZE/BANDAGES/DRESSINGS) ×2 IMPLANT
BANDAGE ESMARK 6X9 LF (GAUZE/BANDAGES/DRESSINGS) ×1 IMPLANT
BANDAGE GAUZE ELAST BULKY 4 IN (GAUZE/BANDAGES/DRESSINGS) ×2 IMPLANT
BLADE SAG 18X100X1.27 (BLADE) ×2 IMPLANT
BNDG CMPR 9X6 STRL LF SNTH (GAUZE/BANDAGES/DRESSINGS) ×1
BNDG ESMARK 6X9 LF (GAUZE/BANDAGES/DRESSINGS) ×2
CEMENT BONE 1-PACK (Cement) ×4 IMPLANT
CLOTH BEACON ORANGE TIMEOUT ST (SAFETY) ×2 IMPLANT
CONT SPECI 4OZ STER CLIK (MISCELLANEOUS) ×2 IMPLANT
CUFF TOURN SGL QUICK 34 (TOURNIQUET CUFF) ×2
CUFF TRNQT CYL 34X4X40X1 (TOURNIQUET CUFF) ×1 IMPLANT
DRAPE EXTREMITY T 121X128X90 (DRAPE) ×2 IMPLANT
DRAPE LG THREE QUARTER DISP (DRAPES) ×2 IMPLANT
DRAPE POUCH INSTRU U-SHP 10X18 (DRAPES) ×2 IMPLANT
DRAPE U-SHAPE 47X51 STRL (DRAPES) ×2 IMPLANT
DRSG ADAPTIC 3X8 NADH LF (GAUZE/BANDAGES/DRESSINGS) ×2 IMPLANT
DRSG PAD ABDOMINAL 8X10 ST (GAUZE/BANDAGES/DRESSINGS) ×3 IMPLANT
ELECT BLADE TIP CTD 4 INCH (ELECTRODE) ×2 IMPLANT
ELECT REM PT RETURN 9FT ADLT (ELECTROSURGICAL) ×2
ELECTRODE REM PT RTRN 9FT ADLT (ELECTROSURGICAL) ×1 IMPLANT
EVACUATOR 1/8 PVC DRAIN (DRAIN) ×2 IMPLANT
FACESHIELD LNG OPTICON STERILE (SAFETY) ×10 IMPLANT
GAUZE KERLIX 2  STERILE LF (GAUZE/BANDAGES/DRESSINGS) ×1 IMPLANT
GAUZE SPONGE 4X4 12PLY STRL LF (GAUZE/BANDAGES/DRESSINGS) ×1 IMPLANT
GLOVE ECLIPSE 8.0 STRL XLNG CF (GLOVE) ×4 IMPLANT
GLOVE INDICATOR 8.0 STRL GRN (GLOVE) ×6 IMPLANT
GOWN STRL REIN XL XLG (GOWN DISPOSABLE) ×4 IMPLANT
HANDPIECE INTERPULSE COAX TIP (DISPOSABLE) ×2
IMMOBILIZER KNEE 20 (SOFTGOODS) ×2
IMMOBILIZER KNEE 20 THIGH 36 (SOFTGOODS) IMPLANT
KIT BASIN OR (CUSTOM PROCEDURE TRAY) ×2 IMPLANT
MANIFOLD NEPTUNE II (INSTRUMENTS) ×2 IMPLANT
NEEDLE HYPO 22GX1.5 SAFETY (NEEDLE) ×2 IMPLANT
NS IRRIG 1000ML POUR BTL (IV SOLUTION) ×2 IMPLANT
PACK TOTAL JOINT (CUSTOM PROCEDURE TRAY) ×2 IMPLANT
POSITIONER SURGICAL ARM (MISCELLANEOUS) ×2 IMPLANT
SET HNDPC FAN SPRY TIP SCT (DISPOSABLE) ×1 IMPLANT
SPONGE GAUZE 4X4 12PLY (GAUZE/BANDAGES/DRESSINGS) ×2 IMPLANT
SPONGE LAP 18X18 X RAY DECT (DISPOSABLE) IMPLANT
SPONGE SURGIFOAM ABS GEL 100 (HEMOSTASIS) IMPLANT
STAPLER VISISTAT 35W (STAPLE) ×2 IMPLANT
SUCTION FRAZIER 12FR DISP (SUCTIONS) ×2 IMPLANT
SUT BONE WAX W31G (SUTURE) ×2 IMPLANT
SUT VIC AB 1 CT1 27 (SUTURE) ×12
SUT VIC AB 1 CT1 27XBRD ANTBC (SUTURE) ×6 IMPLANT
SUT VIC AB 2-0 CT1 27 (SUTURE) ×4
SUT VIC AB 2-0 CT1 27XBRD (SUTURE) ×2 IMPLANT
SYR 20CC LL (SYRINGE) IMPLANT
TOWEL OR 17X26 10 PK STRL BLUE (TOWEL DISPOSABLE) ×4 IMPLANT
TOWER CARTRIDGE SMART MIX (DISPOSABLE) ×2 IMPLANT
TRAY FOLEY CATH 14FRSI W/METER (CATHETERS) ×2 IMPLANT
WATER STERILE IRR 1500ML POUR (IV SOLUTION) ×2 IMPLANT
WRAP KNEE MAXI GEL POST OP (GAUZE/BANDAGES/DRESSINGS) ×4 IMPLANT

## 2011-08-28 NOTE — Transfer of Care (Signed)
Immediate Anesthesia Transfer of Care Note  Patient: [name] [name]  Procedure(s) Performed: Procedure(s) (LRB): TOTAL KNEE ARTHROPLASTY (Left)  Patient Location: PACU  Anesthesia Type: Regional and Spinal  Level of Consciousness: sedated and patient cooperative  Airway & Oxygen Therapy: Patient Spontanous Breathing and Patient connected to face mask oxygen  Post-op Assessment: Report given to PACU RN and Post -op Vital signs reviewed and stable  Post vital signs: Reviewed and stable  Complications: No apparent anesthesia complications

## 2011-08-28 NOTE — Consult Note (Signed)
  I have reviewed my H&P, no interval change.

## 2011-08-28 NOTE — Progress Notes (Signed)
Utilization review completed.  

## 2011-08-28 NOTE — Anesthesia Postprocedure Evaluation (Signed)
  Anesthesia Post-op Note  Patient: [name] [name]  Procedure(s) Performed: Procedure(s) (LRB): TOTAL KNEE ARTHROPLASTY (Left)  Patient Location: PACU  Anesthesia Type: Spinal  Level of Consciousness: awake and alert   Airway and Oxygen Therapy: Patient Spontanous Breathing  Post-op Pain: mild  Post-op Assessment: Post-op Vital signs reviewed, Patient's Cardiovascular Status Stable, Respiratory Function Stable, Patent Airway and No signs of Nausea or vomiting  Post-op Vital Signs: stable  Complications: No apparent anesthesia complications

## 2011-08-28 NOTE — Anesthesia Procedure Notes (Signed)
Spinal  Patient location during procedure: OR Staffing Anesthesiologist: Jenniffer Vessels Performed by: anesthesiologist  Preanesthetic Checklist Completed: patient identified, site marked, surgical consent, pre-op evaluation, timeout performed, IV checked, risks and benefits discussed and monitors and equipment checked Spinal Block Patient position: sitting Prep: Betadine Patient monitoring: heart rate, continuous pulse ox and blood pressure Approach: right paramedian Location: L3-4 Injection technique: single-shot Needle Needle type: Spinocan  Needle gauge: 22 G Needle length: 9 cm Additional Notes Expiration date of kit checked and confirmed. Patient tolerated procedure well, without complications.     

## 2011-08-28 NOTE — Progress Notes (Signed)
CSW consulted for SNF placement. H&P reviewed. Pt plans to go to rehab at [facility] where his spouse is currently a resident. CSW will plan to see pt in the am and begin SNF placement process. [facility] has been contacted and d/c plan has been confirmed. Will folow to assist with d/c planning.

## 2011-08-28 NOTE — Consult Note (Signed)
I have personally reviewed his history and physical exam

## 2011-08-28 NOTE — Brief Op Note (Signed)
08/28/2011  4:11 PM  PATIENT:  [name] [name]  78 y.o. male  PRE-OPERATIVE DIAGNOSIS:  Osteoarthritis of the Left Knee  POST-OPERATIVE DIAGNOSIS:  Osteoarthritis of the Left Knee  PROCEDURE:  Procedure(s) (LRB): TOTAL KNEE ARTHROPLASTY (Left)  SURGEON:  Surgeon(s) and Role:    * Drucilla Schmidt, MD - Primary  PHYSICIAN ASSISTANT:   ASSISTANTS: Mr Idolina Primer [facility]  ANESTHESIA:   spinal  EBL:  Total I/O In: 2000 [I.V.:2000] Out: 465 [Urine:460; Drains:5]  BLOOD ADMINISTERED:none     LOCAL MEDICATIONS USED:  NONE  SPECIMEN:  No Specimen  DISPOSITION OF SPECIMEN:  N/A  COUNTS:  NO 100  minutes  TOURNIQUET:  * Missing tourniquet times found for documented tourniquets in log:  [id number] *  DICTATION: .Other Dictation: Dictation Number [id number]  PLAN OF CARE: Admit to inpatient   PATIENT DISPOSITION:  PACU - hemodynamically stable.   Delay start of Pharmacological VTE agent (>24hrs) due to surgical blood loss or risk of bleeding: yes

## 2011-08-28 NOTE — Anesthesia Preprocedure Evaluation (Signed)
Anesthesia Evaluation  Patient identified by MRN, date of birth, ID band Patient awake  General Assessment Comment:Advanced yrs Increased CV risk  Reviewed: Allergy & Precautions, H&P , NPO status , Patient's Chart, lab work & pertinent test results, reviewed documented beta blocker date and time   Airway Mallampati: II TM Distance: >3 FB Neck ROM: Full    Dental  (+) Teeth Intact   Pulmonary neg pulmonary ROS,  clear to auscultation        Cardiovascular hypertension, Pt. on medications + CAD and +CHF Regular Normal Pacemaker 2008 given clearance for surgery, Feb 20th Currently asymptomatic   Neuro/Psych Negative Neurological ROS  Negative Psych ROS   GI/Hepatic negative GI ROS, Neg liver ROS,   Endo/Other  Gout  Renal/GU Elevated Cr 1.60   BPH     Musculoskeletal negative musculoskeletal ROS (+)   Abdominal   Peds negative pediatric ROS (+)  Hematology negative hematology ROS (+)   Anesthesia Other Findings   Reproductive/Obstetrics negative OB ROS                           Anesthesia Physical Anesthesia Plan  ASA: III  Anesthesia Plan: Spinal   Post-op Pain Management:    Induction: Intravenous  Airway Management Planned: Mask  Additional Equipment:   Intra-op Plan:   Post-operative Plan:   Informed Consent: I have reviewed the patients History and Physical, chart, labs and discussed the procedure including the risks, benefits and alternatives for the proposed anesthesia with the patient or authorized representative who has indicated his/her understanding and acceptance.     Plan Discussed with: CRNA and Surgeon  Anesthesia Plan Comments:         Anesthesia Quick Evaluation

## 2011-08-29 LAB — BASIC METABOLIC PANEL
CO2: 30 mEq/L (ref 19–32)
Calcium: 8.2 mg/dL — ABNORMAL LOW (ref 8.4–10.5)
Chloride: 101 mEq/L (ref 96–112)
Glucose, Bld: 146 mg/dL — ABNORMAL HIGH (ref 70–99)
Potassium: 4.5 mEq/L (ref 3.5–5.1)
Sodium: 137 mEq/L (ref 135–145)

## 2011-08-29 LAB — CBC
Hemoglobin: 9.4 g/dL — ABNORMAL LOW (ref 13.0–17.0)
Platelets: 133 10*3/uL — ABNORMAL LOW (ref 150–400)
RBC: 2.81 MIL/uL — ABNORMAL LOW (ref 4.22–5.81)
WBC: 7.8 10*3/uL (ref 4.0–10.5)

## 2011-08-29 MED ORDER — ENOXAPARIN SODIUM 30 MG/0.3ML ~~LOC~~ SOLN
30.0000 mg | SUBCUTANEOUS | Status: DC
  Administered 2011-08-30 – 2011-08-31 (×2): 30 mg via SUBCUTANEOUS
  Filled 2011-08-29 (×2): qty 0.3

## 2011-08-29 MED ORDER — DEXTROSE IN LACTATED RINGERS 5 % IV SOLN
INTRAVENOUS | Status: DC
  Administered 2011-08-29 – 2011-08-30 (×2): via INTRAVENOUS

## 2011-08-29 MED ORDER — SODIUM CHLORIDE 0.9 % IV BOLUS (SEPSIS)
500.0000 mL | Freq: Once | INTRAVENOUS | Status: AC
Start: 2011-08-29 — End: 2011-08-29
  Administered 2011-08-29: 500 mL via INTRAVENOUS

## 2011-08-29 NOTE — Progress Notes (Signed)
Subjective: 1 Day Post-Op Procedure(s) (LRB): TOTAL KNEE ARTHROPLASTY (Left) Patient reports pain as 7 on 0-10 scale.    Objective: Vital signs in last 24 hours: Temp:  [97.2 F (36.2 C)-98.3 F (36.8 C)] 98.3 F (36.8 C) (02/28 0530) Pulse Rate:  [59-71] 71  (02/28 0530) Resp:  [8-22] 20  (02/28 0530) BP: (70-139)/(40-66) 90/45 mmHg (02/28 0530) SpO2:  [96 %-100 %] 100 % (02/28 0530) Weight:  [82.555 kg (182 lb)] 82.555 kg (182 lb) (02/27 2349)  Intake/Output from previous day: 02/27 0701 - 02/28 0700 In: 3288.8 [P.O.:240; I.V.:2898.8; IV Piggyback:150] Out: 905 [Urine:710; Drains:195] Intake/Output this shift:     Basename 08/29/11 0418  HGB 9.4*    Basename 08/29/11 0418  WBC 7.8  RBC 2.81*  HCT 28.6*  PLT 133*    Basename 08/29/11 0418  NA 137  K 4.5  CL 101  CO2 30  BUN 29*  CREATININE 2.05*  GLUCOSE 146*  CALCIUM 8.2*   No results found for this basename: LABPT:2,INR:2 in the last 72 hours  ABD soft Neurovascular intact Sensation intact distally Intact pulses distally Dorsiflexion/Plantar flexion intact  Assessment/Plan: 1 Day Post-Op Procedure(s) (LRB): TOTAL KNEE ARTHROPLASTY (Left) Up with therapy, Watch for Ileus,BS+ this am. Drain pulled. Dsg change in am. Noted Hgb 9.4 this am.  Thaison Kolodziejski III,Reginald Weida L 08/29/2011, 7:51 AM

## 2011-08-29 NOTE — Progress Notes (Signed)
CSW assisting with d/c planning. FL2 in shadow chart for MD signature. CSW will continue to assist with d/c planning to [facility] for ST SNF placement.

## 2011-08-29 NOTE — Progress Notes (Signed)
ANTICOAGULATION CONSULT NOTE - Initial Consult  Pharmacy Consult for Lovenox Indication: VTE Prophylaxis after L TKA  No Known Allergies  Patient Measurements: Height: 5' 8.5" (174 cm) Weight: 182 lb (82.555 kg) IBW/kg (Calculated) : 69.55    Vital Signs: Temp: 98.3 F (36.8 C) (02/28 1342) Temp src: Oral (02/28 1342) BP: 103/58 mmHg (02/28 1342) Pulse Rate: 66  (02/28 1342)  Labs:  Basename 08/29/11 0418  HGB 9.4*  HCT 28.6*  PLT 133*  APTT --  LABPROT --  INR --  HEPARINUNFRC --  CREATININE 2.05*  CKTOTAL --  CKMB --  TROPONINI --   Estimated Creatinine Clearance: 25.9 ml/min (by C-G formula based on Cr of 2.05).  Medical History: Past Medical History  Diagnosis Date  . Osteoarthritis   . Gout   . Hypertension   . History of coronary artery disease   . GERD (gastroesophageal reflux disease)   . CHF (congestive heart failure)   . Diastolic dysfunction   . History of GI bleed   . History of pacemaker     Medtronic Adapta ADDro1  . Peptic ulcer disease   . BPH (benign prostatic hypertrophy)   . 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 Hyperlipidemia   . History of cerebrovascular disease   . Kidney stone   . Depression     Medications:  Scheduled:    . allopurinol  300 mg Oral Daily  . atorvastatin  40 mg Oral Daily  .  ceFAZolin (ANCEF) IV  2 g Intravenous Q6H  . finasteride  5 mg Oral Daily  . furosemide  40 mg Oral Daily  . metoprolol succinate  25 mg Oral BID  . multivitamin  1 tablet Oral BID  . pantoprazole  40 mg Oral BID  . polyethylene glycol  17 g Oral Daily  . sertraline  50 mg Oral QHS  . sodium chloride  500 mL Intravenous Once  . sodium chloride  500 mL Intravenous Once  . DISCONTD: HYDROmorphone PCA 0.3 mg/mL   Intravenous Q4H  . DISCONTD: Ocuvite Extra  1 tablet Oral BID  . DISCONTD: Polyethylene Glycol 3350  17 g Oral Daily  . DISCONTD: povidone-iodine   Topical Once  . DISCONTD: rivaroxaban  10 mg Oral Q  breakfast     Assessment: -78 y/o M s/p L TKA 08/28/11.  Original plan for VTE prophylaxis, but CrCl < 57mL/min so will need to use alternative agent to avoid drug accumulation and increased bleeding risk.  Orders received to DC Xarelto and begin Lovenox  Goal of Therapy:  -Lovenox prophylaxis with dosage adjusted for renal function   Plan:  -DC Xarelto -Lovenox 30mg  SQ q24h (reduced dosage for CrCl < 76mL/min) - first dose tomorrow -Follow SCr  Elie Goody, PharmD, BCPS Pager: [phone] 08/29/2011  1:48 PM

## 2011-08-29 NOTE — Progress Notes (Signed)
Physical Therapy Treatment Patient Details Name: [name] [name] MRN: [id number] DOB: 19-Nov-1923 Today's Date: 08/29/2011  PT Assessment/Plan  PT - Assessment/Plan Comments on Treatment Session: pt c/o increased pain after having sat up in recliner. RN in to give more meds. PT Plan: Discharge plan remains appropriate;Frequency remains appropriate PT Frequency: 7X/week Recommendations for Other Services: OT consult Follow Up Recommendations: Skilled nursing facility Equipment Recommended: Defer to next venue PT Goals  Acute Rehab PT Goals PT Goal Formulation: With patient Time For Goal Achievement: 7 days Pt will go Supine/Side to Sit: with supervision;with HOB 0 degrees PT Goal: Supine/Side to Sit - Progress: Goal set today Pt will go Sit to Supine/Side: with supervision PT Goal: Sit to Supine/Side - Progress: Goal set today Pt will go Sit to Stand: with supervision PT Goal: Sit to Stand - Progress: Progressing toward goal Pt will go Stand to Sit: with supervision PT Goal: Stand to Sit - Progress: Progressing toward goal Pt will Ambulate: 51 - 150 feet;with supervision;with rolling walker PT Goal: Ambulate - Progress: Goal set today Pt will Perform Home Exercise Program: with supervision, verbal cues required/provided PT Goal: Perform Home Exercise Program - Progress: Goal set today  PT Treatment Precautions/Restrictions  Precautions Precautions: Knee Required Braces or Orthoses: Yes Knee Immobilizer: Discontinue once straight leg raise with < 10 degree lag Mobility (including Balance) Bed Mobility Bed Mobility: Yes  Sit to Supine: 3: Mod assist;HOB flat Sit to Supine - Details (indicate cue type and reason): mod assist to place LLE onto bed Transfers Transfers: Yes Sit to Stand: 3: Mod assist;From chair/3-in-1;With armrests Sit to Stand Details (indicate cue type and reason): vc to push from armrests. Stand to Sit: 3: Mod assist;To bed;With upper extremity assist Stand  to Sit Details: vc to place LLE forward and reach back to bed. Ambulation/Gait Ambulation/Gait: Yes Ambulation/Gait Assistance: 1: +2 Total assist Ambulation/Gait Assistance Details (indicate cue type and reason): vc for posture. Ambulation Distance (Feet): 5 Feet Assistive device: Rolling walker Gait Pattern: Step-to pattern    Exercise   End of Session PT - End of Session Equipment Utilized During Treatment: Left knee immobilizer Activity Tolerance: Patient tolerated treatment well Patient left: inbe w/bell in reach;with family/visitor present Nurse Communication: Mobility status for transfers General Behavior During Session: [facility] for tasks performed Cognition: [facility] for tasks performed  [name] 08/29/2011, 12:43 PM

## 2011-08-29 NOTE — Progress Notes (Signed)
Since increasing pt fluid to 53ml/hr and changing ivf to d5lr, pt output is better.  From 4098-1191, pt put out from foley.

## 2011-08-29 NOTE — Progress Notes (Signed)
Spoke with md aplington about pt low output.  Between 0530 and 1415 pt put out .  New orders received for new ivf and new rate and also to keep foley in.

## 2011-08-29 NOTE — Progress Notes (Signed)
Patient's urine output for the night was 250cc total. On call MD Dr. Victorino Dike paged with with orders to give a 500cc bolus. Will continue to monitor patient.

## 2011-08-29 NOTE — Progress Notes (Signed)
CARE MANAGEMENT NOTE 08/29/2011  Patient:  [name] [name], [name] [name]   Account Number:  [id number]  Date Initiated:  08/29/2011  Documentation initiated by:  Colleen Can  Subjective/Objective Assessment:   DX OSTEOARTHRITIS LEFT KNEEE; TOTAL KNEE REPLACEMNT     Action/Plan:   ST SNF   Anticipated DC Date:  08/31/2011   Anticipated DC Plan:  SKILLED NURSING FACILITY  In-house referral  Clinical Social Worker      DC Planning Services  NA      [facility] Choice  NA   Choice offered to / List presented to:  NA   DME arranged  NA      DME agency  NA     HH arranged  NA      HH agency  NA   Status of service:  Completed, signed off Medicare Important Message given?  NA - LOS <3 / Initial given by admissions (If response is "NO", the following Medicare IM given date fields will be blank) Date Medicare IM given:   Date Additional Medicare IM given:

## 2011-08-29 NOTE — Op Note (Signed)
NAMEMarland Kitchen  [name], [name] NO.:  [id number]  MEDICAL RECORD NO.:  [id number]  LOCATION:  1616                         FACILITY:  [facility]  PHYSICIAN:  Marlowe Kays, M.D.  DATE OF BIRTH:  [birth year]  DATE OF PROCEDURE:  08/28/2011 DATE OF DISCHARGE:                              OPERATIVE REPORT   PREOPERATIVE DIAGNOSIS:  Osteoarthritis, left knee.  POSTOPERATIVE DIAGNOSIS:  Osteoarthritis, left knee.  OPERATION:  Osteonics total knee replacement, left.  SURGEON:  Marlowe Kays, M.D.  ASSISTANTDruscilla Brownie. Cherlynn June.  ANESTHESIA:  Spinal.  PATHOLOGY AND JUSTIFICATION FOR PROCEDURE:  I performed knee replacement of his right knee 13 years ago.  He has been very pleased with it.  He has had progressive pain and deformity in his left knee with bone-on- bone abutment medially.  He has been cleared from his cardiologist to proceed with the above-mentioned surgery.  [name] assistance was necessary for holding of leg, instruments, and general assistance, and is technically demanding case.  DESCRIPTION OF PROCEDURE:  Prophylactic antibiotics, satisfied spinal anesthesia, Foley catheter inserted.  Pneumatic tourniquet with lateral hip stabilizer and Surefoot.  Left leg was prepped with DuraPrep, draped in sterile field, IV employed, time-out performed.  His left leg was Esmarched out sterilely and tourniquet inflated to 250 mmHg.  Vertical midline incision down to the patellar mechanism with median parapatellar incision to open the joint.  Pes anserinus and medial collateral ligament were dissected off the proximal tibia.  I freed up the patellar mechanism enough that the patella could be everted and the knee flexed. Osteophytes around the patella and femur were removed and anterior portions of both menisci and ACL-PCL complex were excised.  I then made a 5/16th inch drill hole in the distal femur and placed the Axis Aligner for a 5-degree valgus cut of  the distal femur.  I elected to take off 12- mm route in the usual pin because he had a flexion contracture.  I then sized the femur at #9, which is what we used in his right knee.  Scribe lines were placed on the cut distal femur and I then placed the cutting jig to make anterior and posterior cuts, posterior and anterior chamfer rings.  Going next to the tibia, remnants of the menisci and ACL/PCL were removed and the tibia was sized at #7.  Using the tibial base plate, I made my intramedullary drill hole followed by step-cut drill and canal finder.  I then used the intramedullary rod with the cutting jig for 90 degrees and a 4-mm cut off the depressed medial tibial plateau.  This cut was made.  I then placed the lamina spreader and removed remnants of bone from the posterior femoral condyles.  I then placed the jig for creating the patellar groove and also for the notchplasty.  After performing these modalities, I then placed a trial prosthesis on the femur and before even put any tibial component on, I could see that we just did not have enough room in the tibial femoral interval.  Accordingly, I went back to the tibia and took an additional 4-mm off the proximal tibia.  I then placed the 7  tibial base plate with a 16-XW spacer and found this fit nicely.  Using the external rod, I split the bimalleolar distance, I put scribe lines on the anterior tibia using the baseplate.  While the knee was in extension, I then measured the patella at a 26 and we used the 10-mm recess cutting jig to create the patellar fixation holes.  I then placed the trial patella and trimmed away excess bone from around the perimeter of the patella button.  Returning to the tibia, I then placed the base plate on the tibia using the previous scribe lines and stabilized with 3 pins.  I used the tripod apparatus for the tibial spine trough.  The knee was then water-picked while the instruments were opened and  methyl methacrylate mixed.  We put methyl methacrylate on the undersurface of the tibial component along the stem and posterior aspects of the femoral condyle portion of the femoral component.  I then individually glued in the 3 components starting first with the tibia impacting it tightly and removing excess methyl methacrylate.  I followed this with impacting the femoral component again over methyl methacrylate, and we held the knee in extension with a 10-mm spacer while we glued in the patella holding it with a patellar clamp.  When the methacrylate had hardened, I removed the patellar clamp and we removed small bits of methacrylate from around the knee.  Gelfoam was placed in the distal femoral hole.  After irrigating the wound, I then placed the final 10-mm spacer, posterior stabilized, and knee was reduced and found to be nice and stable with excellent flexion and extension and no lateral release was needed with the patella tracking normally.  I then closed the wound over Hemovac with interrupted #1 Vicryl in 2 layers and synovium capsule distally and in the quadriceps proximally.  Subcutaneous tissue was quite thin.  We used a few 2-0 Vicryl and then staples.  Betadine and Adaptic dry sterile dressing were applied.  Tourniquet was released with less than 2 hours of tourniquet time having elapsed.  Dry sterile dressing applied followed by knee immobilizer.  He tolerated the procedure well, was taken to the recovery room in satisfied condition with no known complications.          ______________________________ Marlowe Kays, M.D.     JA/MEDQ  D:  08/28/2011  T:  08/29/2011  Job:  [id number]

## 2011-08-29 NOTE — Evaluation (Signed)
Physical Therapy Evaluation Patient Details Name: [name] [name] MRN: [id number] DOB: Nov 18, 1923 Today's Date: 08/29/2011  Problem List:  Patient Active Problem List  Diagnoses  . HYPERLIPIDEMIA  . HYPOPOTASSEMIA  . ESSENTIAL HYPERTENSION, BENIGN  . CAD  . DIASTOLIC HEART FAILURE, CHRONIC  . CAROTID OCCLUSIVE DISEASE  . KIDNEY DISEASE  . DYSPNEA  . Pacemaker  . Aortic stenosis  . Preop cardiovascular exam    Past Medical History:  Past Medical History  Diagnosis Date  . Osteoarthritis   . Gout   . Hypertension   . History of coronary artery disease   . GERD (gastroesophageal reflux disease)   . CHF (congestive heart failure)   . Diastolic dysfunction   . History of GI bleed   . History of pacemaker     Medtronic Adapta ADDro1  . Peptic ulcer disease   . BPH (benign prostatic hypertrophy)   . 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 Hyperlipidemia   . History of cerebrovascular disease   . Kidney stone   . Depression    Past Surgical History:  Past Surgical History  Procedure Date  . Pacemaker insertion     Medtronic Adapta ADDro1  . Cholecystectomy   . Total knee arthroplasty   . Hernia repair   . Tonsillectomy     PT Assessment/Plan/Recommendation PT Assessment Clinical Impression Statement: py c/o pain of L knee. pt did tolerate ambulation today. pt will benefit from PT to improve in rom, strength and mobility to dc to SNF PT Recommendation/Assessment: Patient will need skilled PT in the acute care venue PT Problem List: Decreased strength;Decreased range of motion;Decreased activity tolerance;Decreased knowledge of precautions;Pain;Decreased knowledge of use of DME Barriers to Discharge: Decreased caregiver support PT Therapy Diagnosis : Difficulty walking;Acute pain PT Plan PT Frequency: 7X/week PT Treatment/Interventions: DME instruction;Gait training;Therapeutic exercise;Functional mobility training PT Recommendation Recommendations for  Other Services: OT consult Follow Up Recommendations: Skilled nursing facility Equipment Recommended: Defer to next venue PT Goals  Acute Rehab PT Goals PT Goal Formulation: With patient Time For Goal Achievement: 7 days Pt will go Supine/Side to Sit: with supervision;with HOB 0 degrees PT Goal: Supine/Side to Sit - Progress: Goal set today Pt will go Sit to Supine/Side: with supervision PT Goal: Sit to Supine/Side - Progress: Goal set today Pt will go Sit to Stand: with supervision PT Goal: Sit to Stand - Progress: Goal set today Pt will go Stand to Sit: with supervision PT Goal: Stand to Sit - Progress: Goal set today Pt will Ambulate: 51 - 150 feet;with supervision;with rolling walker PT Goal: Ambulate - Progress: Goal set today Pt will Perform Home Exercise Program: with supervision, verbal cues required/provided PT Goal: Perform Home Exercise Program - Progress: Goal set today  PT Evaluation Precautions/Restrictions  Precautions Precautions: Knee Required Braces or Orthoses: Yes Knee Immobilizer: Discontinue once straight leg raise with < 10 degree lag Prior Functioning  Home Living Lives With: Alone Receives Help From: Family Type of Home: House Home Adaptive Equipment: Straight cane;Walker - rolling Prior Function Level of Independence: Independent with basic ADLs;Requires assistive device for independence Able to Take Stairs?: Yes Driving: Yes Cognition Cognition Arousal/Alertness: Awake/alert Overall Cognitive Status: Appears within functional limits for tasks assessed Orientation Level: Oriented X4 Sensation/Coordination   Extremity Assessment RUE Assessment RUE Assessment: Within Functional Limits LUE Assessment LUE Assessment: Within Functional Limits RLE Assessment RLE Assessment: Within Functional Limits LLE Assessment LLE Assessment: Exceptions to [facility] LLE Strength LLE Overall Strength Comments:  pt un able to SLR, knee flex to 30 Mobility (including  Balance) Bed Mobility Bed Mobility: Yes Supine to Sit: 3: Mod assist;With rails;HOB elevated (Comment degrees) Supine to Sit Details (indicate cue type and reason): support of LLE to move to edge, used rail Transfers Transfers: Yes Sit to Stand: 3: Mod assist;From elevated surface;From bed Sit to Stand Details (indicate cue type and reason): vc for pushing from bed. Stand to Sit: 3: Mod assist;To chair/3-in-1;With armrests Stand to Sit Details: vc to place LLE forward and reach back to armrests Ambulation/Gait Ambulation/Gait: Yes Ambulation/Gait Assistance: 1: +2 Total assist Ambulation/Gait Assistance Details (indicate cue type and reason): vc for sequence and step length Ambulation Distance (Feet): 40 Feet Assistive device: Rolling walker Gait Pattern: Step-to pattern    Exercise  Total Joint Exercises Ankle Circles/Pumps: AROM;10 reps;Both Quad Sets: AROM;10 reps;Supine;Left Heel Slides: AAROM;Left;10 reps;Supine Hip ABduction/ADduction: AAROM;10 reps;Supine;Left End of Session PT - End of Session Equipment Utilized During Treatment: Left knee immobilizer Activity Tolerance: Patient tolerated treatment well Patient left: in chair;with call bell in reach;with family/visitor present General Behavior During Session: [facility] for tasks performed Cognition: [facility] for tasks performed  [name] 08/29/2011, 12:28 PM

## 2011-08-30 ENCOUNTER — Encounter (HOSPITAL_COMMUNITY): Payer: Self-pay | Admitting: Orthopedic Surgery

## 2011-08-30 LAB — CBC
HCT: 24.1 % — ABNORMAL LOW (ref 39.0–52.0)
MCV: 101.3 fL — ABNORMAL HIGH (ref 78.0–100.0)
RBC: 2.38 MIL/uL — ABNORMAL LOW (ref 4.22–5.81)
WBC: 8.5 10*3/uL (ref 4.0–10.5)

## 2011-08-30 MED ORDER — METHOCARBAMOL 500 MG PO TABS: 500.0000 mg | ORAL_TABLET | Freq: Four times a day (QID) | ORAL | Status: AC | PRN

## 2011-08-30 MED ORDER — HYDROCODONE-ACETAMINOPHEN 5-325 MG PO TABS: 1.0000 | ORAL_TABLET | ORAL | Status: AC | PRN

## 2011-08-30 MED ORDER — SODIUM CHLORIDE 0.9 % IV BOLUS (SEPSIS)
250.0000 mL | Freq: Once | INTRAVENOUS | Status: AC
  Administered 2011-08-30: 250 mL via INTRAVENOUS

## 2011-08-30 MED ORDER — ENOXAPARIN SODIUM 30 MG/0.3ML ~~LOC~~ SOLN: 30.0000 mg | SUBCUTANEOUS | Status: DC

## 2011-08-30 MED ORDER — SODIUM CHLORIDE 0.9 % IV BOLUS (SEPSIS): 250.0000 mL | Freq: Once | INTRAVENOUS | Status: DC

## 2011-08-30 MED FILL — Sodium Chloride Inj 0.9%: INTRAMUSCULAR | Qty: 10 | Status: AC

## 2011-08-30 NOTE — Progress Notes (Signed)
Physical Therapy Treatment Patient Details Name: [name] [name] MRN: [id number] DOB: 1923-08-07 Today's Date: 08/30/2011  PT Assessment/Plan  PT - Assessment/Plan Comments on Treatment Session:   pt with noted hypotension with upright position. 110/64 supine, 72/38 sitting on eob, increase  to 109/56. RN notified. PT Plan: Discharge plan remains appropriate;Frequency remains appropriate PT Frequency: 7X/week Recommendations for Other Services: OT consult Follow Up Recommendations: Skilled nursing facility Equipment Recommended: Defer to next venue PT Goals  Acute Rehab PT Goals Pt will go Supine/Side to Sit: with supervision;with HOB 0 degrees PT Goal: Supine/Side to Sit - Progress: Progressing toward goal Pt will go Sit to Stand: with supervision PT Goal: Sit to Stand - Progress: Progressing toward goal Pt will go Stand to Sit: with supervision PT Goal: Stand to Sit - Progress: Progressing toward goal Pt will Ambulate: 51 - 150 feet;with supervision;with rolling walker PT Goal: Ambulate - Progress: Progressing toward goal Pt will Perform Home Exercise Program: with supervision, verbal cues required/provided PT Goal: Perform Home Exercise Program - Progress: Progressing toward goal  PT Treatment Precautions/Restrictions  Precautions Precautions: Knee Required Braces or Orthoses: Yes Knee Immobilizer: Discontinue once straight leg raise with < 10 degree lag Mobility (including Balance) Bed Mobility Bed Mobility: Yes Supine to Sit: 3: Mod assist;HOB elevated (Comment degrees) Supine to Sit Details (indicate cue type and reason): HOB @50 , Transfers Sit to Stand: 1: +2 Total assist;Patient percentage (comment);From bed;With upper extremity assist Sit to Stand Details (indicate cue type and reason): pt 60% for safety Stand to Sit: 1: +2 Total assist;Patient percentage (comment);To chair/3-in-1;With upper extremity assist Stand to Sit Details: pt 60% for safety with decreased BPs at  EOB and O2/catheter Ambulation/Gait Ambulation/Gait Assistance: 1: +2 Total assist Ambulation/Gait Assistance Details (indicate cue type and reason): vc for turns and backing up.to recliner. pt very unsteady. Ambulation Distance (Feet): 5 Feet Assistive device: Rolling walker Gait Pattern: Step-to pattern    Exercise  Total Joint Exercises Ankle Circles/Pumps: AROM;10 reps;Both Quad Sets: AROM;10 reps;Supine;Left Short Arc QuadBarbaraann Boys;Left;10 reps;Supine Heel Slides: AAROM;Left;10 reps;Supine Hip ABduction/ADduction: AAROM;10 reps;Supine;Left Straight Leg Raises: AAROM;Left;10 reps;Supine End of Session PT - End of Session Equipment Utilized During Treatment: Left lower extremity prosthesis Activity Tolerance: Patient limited by fatigue (by dizziness) Patient left: in bed;with call bell in reach Nurse Communication: Mobility status for transfers (decreased BP with uprightnes.) General Behavior During Session: [facility] for tasks performed Cognition: [facility] for tasks performed  [name] 08/30/2011, 1:12 PM

## 2011-08-30 NOTE — Progress Notes (Signed)
Priority summary dictated # [id number]. Rx & plan on chart.

## 2011-08-30 NOTE — Progress Notes (Signed)
[facility] contacted to assist with d/c planning. If MD unable to provide D/C Summary today for SAT admit SNF will need D/C Summary prior to 9am SAT morning. Weekend CSW will assist with d/c planning in the am.

## 2011-08-30 NOTE — Evaluation (Signed)
Occupational Therapy Evaluation Patient Details Name: [name] [name] MRN: [id number] DOB: 07-14-1923 Today's Date: 08/30/2011  Problem List:  Patient Active Problem List  Diagnoses  . HYPERLIPIDEMIA  . HYPOPOTASSEMIA  . ESSENTIAL HYPERTENSION, BENIGN  . CAD  . DIASTOLIC HEART FAILURE, CHRONIC  . CAROTID OCCLUSIVE DISEASE  . KIDNEY DISEASE  . DYSPNEA  . Pacemaker  . Aortic stenosis  . Preop cardiovascular exam    Past Medical History:  Past Medical History  Diagnosis Date  . Osteoarthritis   . Gout   . Hypertension   . History of coronary artery disease   . GERD (gastroesophageal reflux disease)   . CHF (congestive heart failure)   . Diastolic dysfunction   . History of GI bleed   . History of pacemaker     Medtronic Adapta ADDro1  . Peptic ulcer disease   . BPH (benign prostatic hypertrophy)   . 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 Hyperlipidemia   . History of cerebrovascular disease   . Kidney Rufino Staup   . Depression    Past Surgical History:  Past Surgical History  Procedure Date  . Pacemaker insertion     Medtronic Adapta ADDro1  . Cholecystectomy   . Total knee arthroplasty   . Hernia repair   . Tonsillectomy   . Total knee arthroplasty 08/28/2011    Procedure: TOTAL KNEE ARTHROPLASTY;  Surgeon: Drucilla Schmidt, MD;  Location: [facility] ORS;  Service: Orthopedics;  Laterality: Left;    OT Assessment/Plan/Recommendation OT Assessment Clinical Impression Statement: Pt will benefit from skilled OT services to increase his activity tolerance and independence with ADL for next venue of care.  OT Recommendation/Assessment: Patient will need skilled OT in the acute care venue OT Problem List: Decreased strength;Decreased activity tolerance;Decreased knowledge of use of DME or AE;Pain OT Therapy Diagnosis : Generalized weakness OT Plan OT Frequency: Min 1X/week OT Treatment/Interventions: Self-care/ADL training;Therapeutic activities;DME and/or AE  instruction;Patient/family education OT Recommendation Follow Up Recommendations: Skilled nursing facility Equipment Recommended: Defer to next venue Individuals Consulted Consulted and Agree with Results and Recommendations: Patient OT Goals Acute Rehab OT Goals OT Goal Formulation: With patient Time For Goal Achievement: 7 days ADL Goals Pt Will Perform Grooming: with min assist;Standing at sink ADL Goal: Grooming - Progress: Goal set today Pt Will Perform Lower Body Bathing: with min assist;Sit to stand from chair;Sit to stand from bed ADL Goal: Lower Body Bathing - Progress: Goal set today Pt Will Perform Lower Body Dressing: with min assist;Sit to stand from chair;Sit to stand from bed ADL Goal: Lower Body Dressing - Progress: Goal set today Pt Will Transfer to Toilet: with min assist;with DME;Ambulation;3-in-1 ADL Goal: Toilet Transfer - Progress: Goal set today Pt Will Perform Toileting - Clothing Manipulation: with min assist;Standing ADL Goal: Toileting - Clothing Manipulation - Progress: Goal set today Pt Will Perform Toileting - Hygiene: with min assist;Sit to stand from 3-in-1/toilet ADL Goal: Toileting - Hygiene - Progress: Goal set today  OT Evaluation Precautions/Restrictions  Precautions Precautions: Knee Required Braces or Orthoses: Yes Knee Immobilizer: Discontinue once straight leg raise with < 10 degree lag Prior Functioning Home Living Lives With: Alone Receives Help From: Family Type of Home: House Home Adaptive Equipment: Straight cane;Walker - rolling Prior Function Level of Independence: Independent with basic ADLs;Requires assistive device for independence;Independent with homemaking with ambulation Driving: Yes ADL ADL Eating/Feeding: Simulated;Independent;Other (comment) (with cup) Where Assessed - Eating/Feeding: Chair Grooming: Simulated;Set up;Wash/dry face Where Assessed - Grooming: Sitting,  chair Upper Body Bathing: Simulated;Chest;Right  arm;Left arm;Abdomen;Minimal assistance Upper Body Bathing Details (indicate cue type and reason): to help manage O2 tubing/IV Where Assessed - Upper Body Bathing: Sitting, bed;Unsupported Lower Body Bathing: Simulated;+2 Total assistance;Comment for patient % Lower Body Bathing Details (indicate cue type and reason): pt 30% Where Assessed - Lower Body Bathing: Sit to stand from bed Upper Body Dressing: Simulated;Minimal assistance Where Assessed - Upper Body Dressing: Sitting, bed;Unsupported Lower Body Dressing: Simulated;+2 Total assistance;Comment for patient % Lower Body Dressing Details (indicate cue type and reason): pt <10%. pt weak and fatigues rapidly. Unable to let go of RW to simulate pulling up clothing. Decreased BPs Where Assessed - Lower Body Dressing: Sit to stand from bed Toilet Transfer: Simulated;+2 Total assistance;Comment for patient % Toilet Transfer Details (indicate cue type and reason): pt 60% with RW Toilet Transfer Method: Stand pivot Toileting - Clothing Manipulation: Simulated;+2 Total assistance;Comment for patient % Toileting - Clothing Manipulation Details (indicate cue type and reason): pt 0% Where Assessed - Glass blower/designer Manipulation: Standing Toileting - Hygiene: Simulated;+2 Total assistance;Comment for patient % Toileting - Hygiene Details (indicate cue type and reason): pt 0% Where Assessed - Toileting Hygiene: Standing Tub/Shower Transfer: Not assessed Tub/Shower Transfer Method: Not assessed Equipment Used: Rolling walker ADL Comments: Pt with decreased activity tolerance. Doesnt complain of dizziness but noted decreased BP with EOB and up to chair. Nsg made aware. Pt fatigues rapidly and is "worn out" after getting up to recliner only.  Vision/Perception    Cognition Cognition Arousal/Alertness: Awake/alert Overall Cognitive Status: Appears within functional limits for tasks assessed Orientation Level: Oriented  X4 Sensation/Coordination Sensation Light Touch: Appears Intact Extremity Assessment RUE Assessment RUE Assessment: Within Functional Limits LUE Assessment LUE Assessment: Within Functional Limits Mobility  Bed Mobility Bed Mobility: Yes Supine to Sit: 3: Mod assist;HOB elevated (Comment degrees) Supine to Sit Details (indicate cue type and reason): assist to move L LE to EOB and UB support to sit up.  Transfers Sit to Stand: 1: +2 Total assist;Patient percentage (comment);From bed;With upper extremity assist Sit to Stand Details (indicate cue type and reason): pt 60% for safety Stand to Sit: 1: +2 Total assist;Patient percentage (comment);To chair/3-in-1;With upper extremity assist Stand to Sit Details: pt 60% for safety with decreased BPs at EOB and O2/catheter/IV Exercises   End of Session OT - End of Session Activity Tolerance: Patient limited by fatigue;Other (comment) (decreased BPs) Patient left: in chair;with call bell in reach General Behavior During Session: [facility] for tasks performed Cognition: [facility] for tasks performed   Lennox Laity [phone] 08/30/2011, 12:52 PM

## 2011-08-30 NOTE — Discharge Summary (Signed)
NAMEMarland [name]  [name], NIN NO.:  [id number]  MEDICAL RECORD NO.:  [id number]  LOCATION:  1616                         FACILITY:  [facility]  PHYSICIAN:  Marlowe Kays, M.D.  DATE OF BIRTH:  1924/02/13  DATE OF ADMISSION:  08/28/2011 DATE OF DISCHARGE:  08/31/2011                        DISCHARGE SUMMARY - REFERRING   ADMITTING DIAGNOSES: 1. End-stage osteoarthritis, left knee. 2. Pacemaker. 3. History of congestive heart failure. 4. Coronary artery disease.  DISCHARGE DIAGNOSES: 1. End-stage osteoarthritis, left knee. 2. Pacemaker. 3. History of congestive heart failure. 4. Coronary artery disease. 5. Mild postoperative anemia.  OPERATION:  On August 28, 2011, the patient underwent total knee arthroplasty on the left, [name], P.A.C. assisted.  BRIEF HISTORY:  This 78 year old male has been seen for progressive problems concerning his left knee.  In the past, he has had a successful run with the right total knee and now is highly desirous to have the left knee done.  Extremely active despite his age.  Patient has been cleared preoperatively by Dr. Olga Millers and Dr. Dossie Arbour, his family physician.  After much consideration including the risks and benefits of surgery and finding on x-ray showing severe end-stage osteoarthritis, it was decided to go ahead with total knee replacement arthroplasty.  COURSE IN THE HOSPITAL:  The patient tolerated surgical procedure quite well.  He was placed in the total knee protocol postoperatively to familiarize himself with total-knee protocol, ambulation.  We allowed weightbearing as tolerated.  He had a little difficulty with blood pressure versus medications postoperatively, and he also has some mild orthostatic hypotension.  Hopefully, this will be resolved as he gains strengths.  He has made arrangement for rehab at a nursing care facility near his home and those plans came in fruition and this dictation  is in preparation of that transfer.  We changed the dressing, pulled the drain from his knee with no difficulty.  The wound was clean and dry at the time of discharge.  Laboratory values in the hospital showed that he had a mild, non-symptomatic postoperative anemia with a hemoglobin directly postop of 9.4 and then on March 1 was at 8.3.  Again, he was not symptomatic.  We will check it in the morning and see how he does. Vital signs were stable.  Blood pressure just taken a few minutes ago was 110/63.  CONDITION ON DISCHARGE:  Improved, stable.  PLAN:  The patient can be discharged to his nursing care facility, to continue with weightbearing as tolerated, use dry dressing in the knee as needed.  I would like to see him back in our office about 2 weeks after date of surgery and the appointment could be made by calling [phone].  He may continue with his home medications, which we find listed is: 1. Allopurinol 300 mg daily. 2. Atorvastatin 80 mg; take 40 mg by mouth every morning. 3. Finasteride 5 mg tablets, 5 mg daily. 4. Furosemide 40 mg tablets, 40 mg daily. 5. Metoprolol succinate 25 mg, 24-hour tablet, 25 mg 2 times a day. 6. Ocuvite extra tabs 1 tab daily. 7. Protonix 40 mg b.i.d. 8. Polyethylene glycol 3350 liquid, take 17 g by mouth  daily. 9. Zoloft 50 mg at bedtime. If you have any problems, you can call us at our office at [phone].     [name] [name] L. Cherlynn June.   ______________________________ Marlowe Kays, M.D.    DLU/MEDQ  D:  08/30/2011  T:  08/30/2011  Job:  [id number]  cc:   Marlowe Kays, M.D. Fax: [phone]

## 2011-08-30 NOTE — Progress Notes (Signed)
Physical Therapy Treatment Patient Details Name: MANUAL NAVARRA MRN: [id number] DOB: [birth year] Today's Date: 08/30/2011  PT Assessment/Plan  PT - Assessment/Plan Comments on Treatment Session: pt continues with decreased BP with upright positions, RN aware. PT Plan: Discharge plan remains appropriate;Frequency remains appropriate PT Frequency: 7X/week Recommendations for Other Services: OT consult Follow Up Recommendations: Skilled nursing facility Equipment Recommended: Defer to next venue PT Goals  Acute Rehab PT Goals Pt will go Supine/Side to Sit: with supervision;with HOB 0 degrees PT Goal: Supine/Side to Sit - Progress: Progressing toward goal Pt will go Sit to Supine/Side: with supervision PT Goal: Sit to Supine/Side - Progress: Progressing toward goal Pt will go Sit to Stand: with supervision PT Goal: Sit to Stand - Progress: Progressing toward goal Pt will go Stand to Sit: with supervision PT Goal: Stand to Sit - Progress: Progressing toward goal Pt will Ambulate: 51 - 150 feet;with supervision;with rolling walker PT Goal: Ambulate - Progress: Progressing toward goal Pt will Perform Home Exercise Program: with supervision, verbal cues required/provided PT Goal: Perform Home Exercise Program - Progress: Progressing toward goal  PT Treatment Precautions/Restrictions  Precautions Precautions: Knee Required Braces or Orthoses: Yes Knee Immobilizer: Discontinue once straight leg raise with < 10 degree lag Mobility (including Balance) Bed Mobility Bed Mobility: Yes  Sit to Supine: 3: Mod assist;HOB flat Sit to Supine - Details (indicate cue type and reason): mod assist to place LLE onto bed Transfers Sit to Stand: From chair/3-in-1;With armrests;With upper extremity assist Sit to Stand Details (indicate cue type and reason): pt 60% for safety Stand to Sit: 1: +2 Total assist;Patient percentage (comment);With upper extremity assist;To bed;To elevated surface Stand to Sit  Details: vc to place LLE forward and reach back to bed Ambulation/Gait Ambulation/Gait: Yes Ambulation/Gait Assistance: 1: +2 Total assist Ambulation/Gait Assistance Details (indicate cue type and reason): to take steps back to bed pt=60 Ambulation Distance (Feet): 5 Feet Assistive device: Rolling walker Gait Pattern: Step-to pattern    Exercise  End of Session PT - End of Session Equipment Utilized During Treatment: Left knee immobilizer Activity Tolerance: Patient limited by fatigue Patient left: in bed;with call bell in reach Nurse Communication: Mobility status for transfers (BP down to 92/52, 98% 2l, 69 hr) General Behavior During Session: [facility] for tasks performed Cognition: [facility] for tasks performed  [name] 08/30/2011, 1:49 PM

## 2011-08-30 NOTE — Progress Notes (Signed)
Subjective: 2 Days Post-Op Procedure(s) (LRB): TOTAL KNEE ARTHROPLASTY (Left) Patient reports pain as 2 on 0-10 scale.    Objective: Vital signs in last 24 hours: Temp:  [97.5 F (36.4 C)-98.3 F (36.8 C)] 97.5 F (36.4 C) (03/01 0555) Pulse Rate:  [64-66] 64  (03/01 0555) Resp:  [16-18] 16  (03/01 0555) BP: (103-110)/(55-65) 110/64 mmHg (03/01 0555) SpO2:  [97 %-100 %] 97 % (03/01 0555)  Intake/Output from previous day: 02/28 0701 - 03/01 0700 In: 2086.7 [P.O.:360; I.V.:1726.7] Out: 775 [Urine:775] Intake/Output this shift:     Basename 08/30/11 0355 08/29/11 0418  HGB 8.3* 9.4*    Basename 08/30/11 0355 08/29/11 0418  WBC 8.5 7.8  RBC 2.38* 2.81*  HCT 24.1* 28.6*  PLT 125* 133*    Basename 08/29/11 0418  NA 137  K 4.5  CL 101  CO2 30  BUN 29*  CREATININE 2.05*  GLUCOSE 146*  CALCIUM 8.2*   No results found for this basename: LABPT:2,INR:2 in the last 72 hours  Neurologically intact Neurovascular intact Sensation intact distally Intact pulses distally  Assessment/Plan: 2 Days Post-Op Procedure(s) (LRB): TOTAL KNEE ARTHROPLASTY (Left) Up with therapy. D/C foley, plans are for transfer to rehab. In am. [place]. Priority summ. Later today.  Dressing changed. Wound dry.    Bleu Minerd III,Catharine Kettlewell L 08/30/2011, 7:17 AM

## 2011-08-30 NOTE — Progress Notes (Addendum)
Physical Therapy Treatment Patient Details Name: [name] [name] MRN: [id number] DOB: January 18, 1924 Today's Date: 08/30/2011  PT Assessment/Plan  PT - Assessment/Plan  pt is sleepy today. Noted decr hgb , low BP in 90's.  PT Plan: Discharge plan remains appropriate;Frequency remains appropriate PT Frequency: 7X/week Recommendations for Other Services: OT consult Follow Up Recommendations: Skilled nursing facility Equipment Recommended: Defer to next venue PT Goals  Acute Rehab PT Goals Pt will go Supine/Side to Sit: with supervision;with HOB 0 degrees PT Goal: Supine/Side to Sit - Progress: Progressing toward goal Pt will go Sit to Stand: with supervision PT Goal: Sit to Stand - Progress: Progressing toward goal Pt will go Stand to Sit: with supervision PT Goal: Stand to Sit - Progress: Progressing toward goal Pt will Ambulate: 51 - 150 feet;with supervision;with rolling walker PT Goal: Ambulate - Progress: Progressing toward goal Pt will Perform Home Exercise Program: with supervision, verbal cues required/provided PT Goal: Perform Home Exercise Program - Progress: Progressing toward goal  PT Treatment Precautions/Restrictions  Precautions Precautions: Knee Required Braces or Orthoses: Yes Knee Immobilizer: Discontinue once straight leg raise with < 10 degree lag Mobility (including Balance)   Exercise  Total Joint Exercises Ankle Circles/Pumps: AROM;10 reps;Both Quad Sets: AROM;10 reps;Supine;Left Short Arc QuadBarbaraann Boys;Left;10 reps;Supine Heel Slides: AAROM;Left;10 reps;Supine Hip ABduction/ADduction: AAROM;10 reps;Supine;Left Straight Leg Raises: AAROM;Left;10 reps;Supine End of Session PT - End of Session Equipment Utilized During Treatment: Left lower extremity  KI Activity Tolerance: Patient limited by fatigue (by dizziness) Patient left: in bed;with call bell in reach Nurse Communication:  General Behavior During Session: [facility] for tasks performed, sleepy  today. Cognition: [facility] for tasks performed  [name] 08/30/2011, 1:16 PM

## 2011-08-30 NOTE — Discharge Instructions (Signed)
Call office for appt. To be seen in 2 weeks. Dry dressing as needed.

## 2011-08-31 LAB — CBC
HCT: 26.4 % — ABNORMAL LOW (ref 39.0–52.0)
Hemoglobin: 8.8 g/dL — ABNORMAL LOW (ref 13.0–17.0)
MCH: 33.8 pg (ref 26.0–34.0)
MCHC: 33.3 g/dL (ref 30.0–36.0)
RBC: 2.6 MIL/uL — ABNORMAL LOW (ref 4.22–5.81)

## 2011-08-31 MED ORDER — SODIUM CHLORIDE 0.9 % IV BOLUS (SEPSIS)
500.0000 mL | Freq: Once | INTRAVENOUS | Status: AC
  Administered 2011-08-31: 11:00:00 via INTRAVENOUS

## 2011-08-31 NOTE — Progress Notes (Signed)
Physical Therapy Treatment Patient Details Name: [name] [name] MRN: [id number] DOB: Nov 06, 1923 Today's Date: 08/31/2011  PT Assessment/Plan  PT - Assessment/Plan Comments on Treatment Session: pt continues with decreased BP with upright positions, RN aware. RN contacted MD re: BP PT Plan: Discharge plan remains appropriate;Frequency remains appropriate PT Frequency: 7X/week Follow Up Recommendations: Skilled nursing facility PT Goals  Acute Rehab PT Goals Pt will go Sit to Supine/Side: with supervision PT Goal: Sit to Supine/Side - Progress: Progressing toward goal Pt will go Sit to Stand: with supervision PT Goal: Sit to Stand - Progress: Progressing toward goal Pt will go Stand to Sit: with supervision PT Goal: Stand to Sit - Progress: Progressing toward goal Pt will Ambulate: 51 - 150 feet;with supervision;with rolling walker PT Goal: Ambulate - Progress: Not progressing  PT Treatment Precautions/Restrictions  Precautions Precautions: Knee Required Braces or Orthoses: Yes Knee Immobilizer: Discontinue once straight leg raise with < 10 degree lag Restrictions Weight Bearing Restrictions: No Mobility (including Balance) Bed Mobility Sit to Supine: 3: Mod assist;HOB flat Sit to Supine - Details (indicate cue type and reason): mod assist to polace LLE onto bed Transfers Sit to Stand: 1: +2 Total assist;From chair/3-in-1;With armrests Sit to Stand Details (indicate cue type and reason): vc to push self from bsc, pt had much difficulty. Stand to Sit: 1: +2 Total assist;Patient percentage (comment);With upper extremity assist;To bed;To elevated surface Stand to Sit Details: pt had no control to sit down. Ambulation/Gait Ambulation/Gait: Yes Ambulation/Gait Assistance: 1: +2 Total assist Ambulation/Gait Assistance Details (indicate cue type and reason): pt had difficulty standing upright, moved slowly, leaning over. Ambulation Distance (Feet): 15 Feet Assistive device: Rolling  walker Gait Pattern: Step-to pattern  Posture/Postural Control Posture/Postural Control: Postural limitations Postural Limitations: forward flexed' Exercise    End of Session PT - End of Session Equipment Utilized During Treatment: Left knee immobilizer Activity Tolerance: Patient limited by fatigue Patient left: in bed;with call bell in reach Nurse Communication: Mobility status for transfers (low bp of 80/47) General Behavior During Session: [facility] for tasks performed Cognition: [facility] for tasks performed  [name] 08/31/2011, 4:54 PM

## 2011-08-31 NOTE — Progress Notes (Signed)
Physical Therapy Treatment Patient Details Name: [name] [name] MRN: [id number] DOB: 02-27-1924 Today's Date: 08/31/2011  PT Assessment/Plan  PT - Assessment/Plan Comments on Treatment Session: pt tolerated there ex well. For SNF PT Plan: Discharge plan remains appropriate;Frequency remains appropriate PT Frequency: 7X/week Follow Up Recommendations: Skilled nursing facility PT Goals  Acute Rehab PT Goals Pt will go Sit to Supine/Side: with supervision PT Goal: Sit to Supine/Side - Progress: Progressing toward goal Pt will go Sit to Stand: with supervision PT Goal: Sit to Stand - Progress: Progressing toward goal Pt will go Stand to Sit: with supervision PT Goal: Stand to Sit - Progress: Progressing toward goal Pt will Ambulate: 51 - 150 feet;with supervision;with rolling walker PT Goal: Ambulate - Progress: Not progressing Pt will Perform Home Exercise Program: with supervision, verbal cues required/provided PT Goal: Perform Home Exercise Program - Progress: Progressing toward goal  PT Treatment Precautions/Restrictions  Precautions Precautions: Knee Required Braces or Orthoses: Yes Knee Immobilizer: Discontinue once straight leg raise with < 10 degree lag Restrictions Weight Bearing Restrictions: No Mobility (including Balance) Bed Mobility Supine to Sit: 3: Mod assist;HOB elevated (Comment degrees) Sit to Supine: 3: Mod assist;HOB flat Sit to Supine - Details (indicate cue type and reason): mod assist to polace LLE onto bed Transfers Exercise  Total Joint Exercises Ankle Circles/Pumps: AROM;10 reps;Both Quad Sets: AROM;10 reps;Supine;Left Short Arc QuadBarbaraann Boys;Left;10 reps;Supine Heel Slides: AAROM;Left;10 reps;Supine Hip ABduction/ADduction: AAROM;10 reps;Supine;Left Straight Leg Raises: AAROM;Left;10 reps;Supine End of Session PT - End of Session Equipment Utilized During Treatment: Left knee immobilizer Activity Tolerance: Patient tolerated treatment  well Patient left: in bed;with call bell in reach Nurse Communication: Mobility status for transfers General Behavior During Session: [facility] for tasks performed Cognition: [facility] for tasks performed  [name] 08/31/2011, 4:56 PM

## 2011-08-31 NOTE — Progress Notes (Signed)
Pt BP dropped when working with PT (80/47) Pt resting comfortably in bed. Denies any headaches, dizziness, chest pain. Just states "he is a little weak". Matt, PA notified pt will be given a bolus, and participate in afternoon PT session. If tolerates well will continue on with discharge. BP rechecked (111/65 HR 70).

## 2011-08-31 NOTE — Progress Notes (Signed)
Subjective: 3 Days Post-Op Procedure(s) (LRB): TOTAL KNEE ARTHROPLASTY (Left)   Patient reports pain as mild. No events. Ready for discharge to [facility].  Objective:   VITALS:   Filed Vitals:   08/31/11 0525  BP: 115/63  Pulse: 67  Temp: 98.2 F (36.8 C)  Resp: 18    Neurovascular intact Dorsiflexion/Plantar flexion intact Incision: dressing C/D/I No cellulitis present Compartment soft  LABS  Basename 08/31/11 0620 08/30/11 0355 08/29/11 0418  HGB 8.8* 8.3* 9.4*  HCT 26.4* 24.1* 28.6*  WBC PENDING 8.5 7.8  PLT PENDING 125* 133*     Basename 08/29/11 0418  NA 137  K 4.5  BUN 29*  CREATININE 2.05*  GLUCOSE 146*     Assessment/Plan: 3 Days Post-Op Procedure(s) (LRB): TOTAL KNEE ARTHROPLASTY (Left)   Up with therapy Discharge to SNF today Follow up as instructed   Anastasio Auerbach. Jericho Cieslik   PAC  08/31/2011, 7:29 AM

## 2011-08-31 NOTE — Progress Notes (Signed)
Patient set to discharge to [facility] today. Daughter at bedside. EMS called for transportation.   Unice Bailey, LCSWA [phone]

## 2011-08-31 NOTE — Progress Notes (Signed)
Pt resting comfortably, no complaints. VSS. Tolerated previous bolus w/o complication. Pt tolerated afternoon PT session. Plans to be transported via EMS to [facility].

## 2011-10-10 ENCOUNTER — Other Ambulatory Visit: Payer: Self-pay | Admitting: Cardiology

## 2011-11-14 ENCOUNTER — Ambulatory Visit (INDEPENDENT_AMBULATORY_CARE_PROVIDER_SITE_OTHER): Payer: Medicare Other | Admitting: *Deleted

## 2011-11-14 ENCOUNTER — Encounter: Payer: Self-pay | Admitting: Internal Medicine

## 2011-11-14 DIAGNOSIS — I442 Atrioventricular block, complete: Secondary | ICD-10-CM

## 2011-11-22 LAB — REMOTE PACEMAKER DEVICE
AL AMPLITUDE: 2.8 mv
ATRIAL PACING PM: 76
BAMS-0001: 175 {beats}/min
BATTERY VOLTAGE: 2.75 V
RV LEAD IMPEDENCE PM: 472 Ohm
VENTRICULAR PACING PM: 100

## 2011-12-02 ENCOUNTER — Encounter: Payer: Self-pay | Admitting: *Deleted

## 2011-12-11 ENCOUNTER — Encounter: Payer: Self-pay | Admitting: Cardiology

## 2012-02-03 ENCOUNTER — Other Ambulatory Visit: Payer: Self-pay | Admitting: Cardiology

## 2012-02-18 ENCOUNTER — Encounter: Payer: Self-pay | Admitting: Internal Medicine

## 2012-02-20 ENCOUNTER — Ambulatory Visit (INDEPENDENT_AMBULATORY_CARE_PROVIDER_SITE_OTHER): Payer: Medicare Other | Admitting: *Deleted

## 2012-02-20 DIAGNOSIS — I442 Atrioventricular block, complete: Secondary | ICD-10-CM

## 2012-02-21 LAB — REMOTE PACEMAKER DEVICE
AL IMPEDENCE PM: 454 Ohm
AL THRESHOLD: 0.625 V
ATRIAL PACING PM: 79
BAMS-0001: 175 {beats}/min
RV LEAD THRESHOLD: 0.625 V

## 2012-02-28 ENCOUNTER — Other Ambulatory Visit: Payer: Self-pay | Admitting: Cardiology

## 2012-02-28 DIAGNOSIS — I6529 Occlusion and stenosis of unspecified carotid artery: Secondary | ICD-10-CM

## 2012-03-05 ENCOUNTER — Encounter (INDEPENDENT_AMBULATORY_CARE_PROVIDER_SITE_OTHER): Payer: Medicare Other

## 2012-03-05 DIAGNOSIS — I6529 Occlusion and stenosis of unspecified carotid artery: Secondary | ICD-10-CM

## 2012-03-12 ENCOUNTER — Encounter: Payer: Self-pay | Admitting: *Deleted

## 2012-04-29 ENCOUNTER — Encounter: Payer: Self-pay | Admitting: *Deleted

## 2012-05-08 ENCOUNTER — Encounter: Payer: Self-pay | Admitting: Internal Medicine

## 2012-05-08 ENCOUNTER — Ambulatory Visit (INDEPENDENT_AMBULATORY_CARE_PROVIDER_SITE_OTHER): Payer: Medicare Other | Admitting: Internal Medicine

## 2012-05-08 VITALS — BP 110/70 | HR 68 | Resp 18 | Wt 191.6 lb

## 2012-05-08 DIAGNOSIS — R29818 Other symptoms and signs involving the nervous system: Secondary | ICD-10-CM

## 2012-05-08 DIAGNOSIS — Z950 Presence of cardiac pacemaker: Secondary | ICD-10-CM

## 2012-05-08 DIAGNOSIS — R2689 Other abnormalities of gait and mobility: Secondary | ICD-10-CM

## 2012-05-08 DIAGNOSIS — I5032 Chronic diastolic (congestive) heart failure: Secondary | ICD-10-CM

## 2012-05-08 LAB — PACEMAKER DEVICE OBSERVATION
AL AMPLITUDE: 2.8 mv
AL THRESHOLD: 0.5 V
BAMS-0001: 175 {beats}/min
BATTERY VOLTAGE: 2.74 V
RV LEAD THRESHOLD: 0.625 V

## 2012-05-08 NOTE — Patient Instructions (Addendum)
Rotate Lasix 40mg  and 80mg  every other day for 4 weeks.  Decrease Metoprolol to once daily.  Keep appt with Dr. Jens Som on 06/16/2012.  Your physician recommends that you return for lab work in: 3 weeks for a BMET  Your physician has requested that you have an echocardiogram in 3 weeks. Echocardiography is a painless test that uses sound waves to create images of your heart. It provides your doctor with information about the size and shape of your heart and how well your heart's chambers and valves are working. This procedure takes approximately one hour. There are no restrictions for this procedure.

## 2012-05-08 NOTE — Assessment & Plan Note (Signed)
He has evidence of gait disturbance. I will defer the primary evaluation of this to Dr. [name] whom he will be seeing next week. I did ask him to talk with Dr. [name] regarding balance rehabilitation and also the use of the Wii Fit as atool in balance rehabilitation.  The other issue is that he has some degree of orthostatic intolerance and we will have to be careful as we adjust his diuretic so as to minimize the likelihood of aggravating it. We will decrease his metoprolol succinate to once daily.

## 2012-05-08 NOTE — Progress Notes (Signed)
Patient Care Team: Jarome Matin, MD as PCP - General (Internal Medicine)   HPI  [name] [name] is a 78 y.o. male seen in followup for pacemaker implanted for sinus node dysfunction that apparently triggered a cardiac arrest. This was done at [facility]   His major complaint is worsening dyspnea on exertion. He has peripheral edema. He denies change in orthopnea nocturnal dyspnea. He denies accompanying chest discomfort.  He has some difficulties with balance with a mild l wide-based gait. He has mild orthostatic intolerance  He has a history of congestive heart failure, felt secondary to diastolic dysfunction; hypertension; hyperlipidemia; mild coronary artery disease; and cerebrovascular disease.  His most recent cardiac catheterization here was in April 2005. At that time, he had nonobstructive coronary artery disease, but he did have elevated right and left filling pressures. Last Myoview was performed on February 05, 2007. Ejection fraction was 65% and the perfusion was normal. His most recent echocardiogram was performed in February of 2013 and showed normal LV function, mild aortic stenosis with a mean gradient of 9 mmHg, mild mitral and tricuspid regurgitation and mild biatrial enlargement.  th.   Past Medical History  Diagnosis Date  . Osteoarthritis   . Gout   . Hypertension   . History of coronary artery disease   . GERD (gastroesophageal reflux disease)   . CHF (congestive heart failure)   . Diastolic dysfunction   . History of GI bleed   . History of pacemaker     Medtronic Adapta ADDro1  . Peptic ulcer disease   . BPH (benign prostatic hypertrophy)   . 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 Hyperlipidemia   . History of cerebrovascular disease   . Kidney stone   . Depression     Past Surgical History  Procedure Date  . Pacemaker insertion     Medtronic Adapta ADDro1  . Cholecystectomy   . Total knee arthroplasty   . Hernia repair   .  Tonsillectomy   . Total knee arthroplasty 08/28/2011    Procedure: TOTAL KNEE ARTHROPLASTY;  Surgeon: Drucilla Schmidt, MD;  Location: [facility] ORS;  Service: Orthopedics;  Laterality: Left;    Current Outpatient Prescriptions  Medication Sig Dispense Refill  . allopurinol (ZYLOPRIM) 300 MG tablet Take 300 mg by mouth daily.       Marland Kitchen atorvastatin (LIPITOR) 80 MG tablet Take 40 mg by mouth every morning.       . enoxaparin (LOVENOX) 30 MG/0.3ML SOLN Inject 0.3 mLs (30 mg total) into the skin daily.  8.4 mL  1  . finasteride (PROSCAR) 5 MG tablet Take 5 mg by mouth daily.       . furosemide (LASIX) 40 MG tablet TAKE ONE TABLET BY MOUTH EVERY DAY  30 tablet  12  . metoprolol (LOPRESSOR) 50 MG tablet TAKE ONE-HALF TABLET BY MOUTH TWICE DAILY  60 tablet  12  . metoprolol succinate (TOPROL-XL) 25 MG 24 hr tablet Take 25 mg by mouth 2 (two) times daily.      . Multiple Vitamins-Minerals (OCUVITE EXTRA) TABS Take 1 tablet by mouth 2 (two) times daily.       . pantoprazole (PROTONIX) 40 MG tablet Take 40 mg by mouth 2 (two) times daily.       . Polyethylene Glycol 3350 LIQD Take 17 g by mouth daily.       . sertraline (ZOLOFT) 50 MG tablet Take 50 mg by mouth at bedtime.  No Known Allergies  Review of Systems negative except from HPI and PMH  Physical Exam BP 110/70  Pulse 68  Resp 18  Wt 191 lb 9.6 oz (86.909 kg)  SpO2 97% Well developed and well nourished in no acute distress HENT normal E scleral and icterus clear Neck Supple JVP 8-9 cm Clear to ausculation Regular rate and rhythm, 2/6 murmur with an S4 Soft with active bowel sounds No clubbing cyanosis 2+ Edema Alert and oriented, grossly normal motor and sensory function although his heel toe gait failed Skin Warm and Dry  Electrocardiogram demonstrates AV pacing  Assessment and  Plan

## 2012-05-08 NOTE — Assessment & Plan Note (Signed)
There is evidence of volume overload. We'll increase his diuretic gently from 40 daily to 40/80. We'll undertake an echo. He will be seeing Dr. Jens Som in about 3 weeks. We will check a metabolic profile at the time of the echo shortly before that visit

## 2012-05-08 NOTE — Assessment & Plan Note (Signed)
The patient's device was interrogated.  The information was reviewed. No changes were made in the programming.    

## 2012-05-29 ENCOUNTER — Ambulatory Visit (HOSPITAL_COMMUNITY): Payer: Medicare Other | Attending: Internal Medicine | Admitting: Radiology

## 2012-05-29 ENCOUNTER — Other Ambulatory Visit (INDEPENDENT_AMBULATORY_CARE_PROVIDER_SITE_OTHER): Payer: Medicare Other

## 2012-05-29 DIAGNOSIS — I379 Nonrheumatic pulmonary valve disorder, unspecified: Secondary | ICD-10-CM | POA: Insufficient documentation

## 2012-05-29 DIAGNOSIS — R0602 Shortness of breath: Secondary | ICD-10-CM

## 2012-05-29 DIAGNOSIS — I5032 Chronic diastolic (congestive) heart failure: Secondary | ICD-10-CM

## 2012-05-29 DIAGNOSIS — E785 Hyperlipidemia, unspecified: Secondary | ICD-10-CM | POA: Insufficient documentation

## 2012-05-29 DIAGNOSIS — R0609 Other forms of dyspnea: Secondary | ICD-10-CM | POA: Insufficient documentation

## 2012-05-29 DIAGNOSIS — I2510 Atherosclerotic heart disease of native coronary artery without angina pectoris: Secondary | ICD-10-CM | POA: Insufficient documentation

## 2012-05-29 DIAGNOSIS — I080 Rheumatic disorders of both mitral and aortic valves: Secondary | ICD-10-CM | POA: Insufficient documentation

## 2012-05-29 DIAGNOSIS — R0989 Other specified symptoms and signs involving the circulatory and respiratory systems: Secondary | ICD-10-CM | POA: Insufficient documentation

## 2012-05-29 DIAGNOSIS — I079 Rheumatic tricuspid valve disease, unspecified: Secondary | ICD-10-CM | POA: Insufficient documentation

## 2012-05-29 DIAGNOSIS — I509 Heart failure, unspecified: Secondary | ICD-10-CM | POA: Insufficient documentation

## 2012-05-29 LAB — BASIC METABOLIC PANEL
CO2: 32 mEq/L (ref 19–32)
Calcium: 9.3 mg/dL (ref 8.4–10.5)
Creatinine, Ser: 1.4 mg/dL (ref 0.4–1.5)
GFR: 51.9 mL/min — ABNORMAL LOW (ref >60.00)
Sodium: 141 mEq/L (ref 135–145)

## 2012-05-29 NOTE — Progress Notes (Signed)
Echocardiogram performed.  

## 2012-06-16 ENCOUNTER — Ambulatory Visit (INDEPENDENT_AMBULATORY_CARE_PROVIDER_SITE_OTHER): Payer: Medicare Other | Admitting: Cardiology

## 2012-06-16 ENCOUNTER — Encounter: Payer: Self-pay | Admitting: Cardiology

## 2012-06-16 VITALS — BP 141/73 | HR 70 | Wt 191.0 lb

## 2012-06-16 DIAGNOSIS — I2510 Atherosclerotic heart disease of native coronary artery without angina pectoris: Secondary | ICD-10-CM

## 2012-06-16 DIAGNOSIS — I10 Essential (primary) hypertension: Secondary | ICD-10-CM

## 2012-06-16 DIAGNOSIS — I350 Nonrheumatic aortic (valve) stenosis: Secondary | ICD-10-CM

## 2012-06-16 DIAGNOSIS — I5032 Chronic diastolic (congestive) heart failure: Secondary | ICD-10-CM

## 2012-06-16 DIAGNOSIS — Z950 Presence of cardiac pacemaker: Secondary | ICD-10-CM

## 2012-06-16 DIAGNOSIS — I6529 Occlusion and stenosis of unspecified carotid artery: Secondary | ICD-10-CM

## 2012-06-16 DIAGNOSIS — I359 Nonrheumatic aortic valve disorder, unspecified: Secondary | ICD-10-CM

## 2012-06-16 NOTE — Assessment & Plan Note (Signed)
Blood pressure controlled. Continue present medications. 

## 2012-06-16 NOTE — Assessment & Plan Note (Signed)
Management per electrophysiology. 

## 2012-06-16 NOTE — Assessment & Plan Note (Signed)
Continue present dose of Lasix. Take additional 40 mg daily as needed. 

## 2012-06-16 NOTE — Assessment & Plan Note (Signed)
Continue aspirin and statin. 

## 2012-06-16 NOTE — Assessment & Plan Note (Signed)
Trivial on most recent echocardiogram. We will plan on repeat studies in the future.

## 2012-06-16 NOTE — Progress Notes (Signed)
HPI: [name] is a very pleasant gentleman who has a history of congestive heart failure, felt secondary to diastolic dysfunction; hypertension; hyperlipidemia, mild coronary artery disease and cerebrovascular disease. He also has had a previous pacemaker placed in December 2008 following a cardiac arrest at [facility]. This was placed at [facility] and apparently was bradycardia mediated. His most recent cardiac catheterization here was in April 2005. At that time, he had nonobstructive coronary artery disease, but he did have elevated right and left filling pressures. Last Myoview was performed on February 05, 2007. Ejection fraction was 65% and the perfusion was normal. His most recent echocardiogram was performed in November 2013. His ejection fraction was 50-55%. There was mild aortic insufficiency. There was mild biatrial enlargement. Carotid Dopplers in sept 2013 showed 60-79% left and 40-59% right stenosis. Followup is recommended in six months. I last saw him in Feb 2013. Patient seen by Dr. Graciela [name] in November of 2013. Diuretics were increased. Since then, the patient has dyspnea with more extreme activities but not with routine activities. It is relieved with rest. It is not associated with chest pain. There is no orthopnea, PND. There is no syncope or palpitations. There is no exertional chest pain. Mild chronic pedal edema.    Current Outpatient Prescriptions  Medication Sig Dispense Refill  . allopurinol (ZYLOPRIM) 300 MG tablet Take 300 mg by mouth daily.       Marland Kitchen aspirin 81 MG tablet Take 81 mg by mouth daily.      Marland Kitchen atorvastatin (LIPITOR) 80 MG tablet Take 40 mg by mouth every morning.       . finasteride (PROSCAR) 5 MG tablet Take 5 mg by mouth daily.       . furosemide (LASIX) 40 MG tablet TAKE ONE TABLET BY MOUTH EVERY DAY  30 tablet  12  . losartan (COZAAR) 100 MG tablet Take 100 mg by mouth daily.      . metoprolol succinate (TOPROL-XL) 25 MG 24 hr tablet Take 25 mg by  mouth 2 (two) times daily.      . Multiple Vitamins-Minerals (OCUVITE EXTRA) TABS Take 1 tablet by mouth 2 (two) times daily.       . pantoprazole (PROTONIX) 40 MG tablet Take 40 mg by mouth 2 (two) times daily.       . Polyethylene Glycol 3350 LIQD Take 17 g by mouth daily.       . sertraline (ZOLOFT) 50 MG tablet Take 50 mg by mouth at bedtime.         Past Medical History  Diagnosis Date  . Osteoarthritis   . Gout   . Hypertension   . History of coronary artery disease   . GERD (gastroesophageal reflux disease)   . CHF (congestive heart failure)   . Diastolic dysfunction   . History of GI bleed   . History of pacemaker     Medtronic Adapta ADDro1  . Peptic ulcer disease   . BPH (benign prostatic hypertrophy)   . 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 Hyperlipidemia   . History of cerebrovascular disease   . Kidney stone   . Depression     Past Surgical History  Procedure Date  . Pacemaker insertion     Medtronic Adapta ADDro1  . Cholecystectomy   . Total knee arthroplasty   . Hernia repair   . Tonsillectomy   . Total knee arthroplasty 08/28/2011    Procedure: TOTAL KNEE ARTHROPLASTY;  Surgeon: [name] Schmidt, MD;  Location: [facility];  Service: Orthopedics;  Laterality: Left;    History   Social History  . Marital Status: Married    Spouse Name: N/A    Number of Children: N/A  . Years of Education: N/A   Occupational History  . Retired    Social History Main Topics  . Smoking status: Former Games developer  . Smokeless tobacco: Former Neurosurgeon    Quit date: 08/13/1976  . Alcohol Use: No  . Drug Use: No  . Sexually Active: Not on file   Other Topics Concern  . Not on file   Social History Narrative   Married for 20 years2 children and 3 grandchildren    ROS: no fevers or chills, productive cough, hemoptysis, dysphasia, odynophagia, melena, hematochezia, dysuria, hematuria, rash, seizure activity, orthopnea, PND, claudication. Remaining systems are  negative.  Physical Exam: Well-developed well-nourished in no acute distress.  Skin is warm and dry.  HEENT is normal.  Neck is supple.  Chest is clear to auscultation with normal expansion.  Cardiovascular exam is regular rate and rhythm. 2/6 systolic murmur left sternal border Abdominal exam nontender or distended. No masses palpated. Extremities show 1+ edema. neuro grossly intact  ECG AV pacing

## 2012-06-16 NOTE — Patient Instructions (Addendum)
Your physician wants you to follow-up in: 1 year. You will receive a reminder letter in the mail two months in advance. If you don't receive a letter, please call our office to schedule the follow-up appointment.  

## 2012-06-16 NOTE — Assessment & Plan Note (Signed)
Continue statin. Lipids and liver monitored by primary care. 

## 2012-06-16 NOTE — Assessment & Plan Note (Signed)
Continue aspirin and statin. Followup carotid Dopplers March 2014.

## 2012-08-10 ENCOUNTER — Ambulatory Visit (INDEPENDENT_AMBULATORY_CARE_PROVIDER_SITE_OTHER): Payer: Medicare Other | Admitting: *Deleted

## 2012-08-10 ENCOUNTER — Other Ambulatory Visit: Payer: Self-pay

## 2012-08-10 DIAGNOSIS — Z950 Presence of cardiac pacemaker: Secondary | ICD-10-CM

## 2012-08-10 DIAGNOSIS — I442 Atrioventricular block, complete: Secondary | ICD-10-CM

## 2012-08-13 LAB — REMOTE PACEMAKER DEVICE
AL AMPLITUDE: 2.8 mv
AL THRESHOLD: 0.625 V
BATTERY VOLTAGE: 2.74 V
RV LEAD THRESHOLD: 0.625 V

## 2012-08-15 ENCOUNTER — Other Ambulatory Visit: Payer: Self-pay

## 2012-08-25 ENCOUNTER — Encounter: Payer: Self-pay | Admitting: *Deleted

## 2012-08-28 ENCOUNTER — Encounter: Payer: Self-pay | Admitting: Internal Medicine

## 2012-09-08 ENCOUNTER — Telehealth: Payer: Self-pay | Admitting: Radiology

## 2012-09-08 NOTE — Telephone Encounter (Signed)
Received fax for patients proventil inhaler from [facility]

## 2012-09-13 NOTE — Telephone Encounter (Signed)
It doesn't look like this patient has been to our office in the last year and a prescription is not appropriate

## 2012-09-13 NOTE — Telephone Encounter (Signed)
I am not familiar with this patient.  Unsure why it was assign

## 2012-09-25 ENCOUNTER — Encounter (INDEPENDENT_AMBULATORY_CARE_PROVIDER_SITE_OTHER): Payer: Medicare Other

## 2012-09-25 DIAGNOSIS — I6529 Occlusion and stenosis of unspecified carotid artery: Secondary | ICD-10-CM

## 2012-10-08 ENCOUNTER — Encounter: Payer: Self-pay | Admitting: Cardiology

## 2012-10-30 ENCOUNTER — Other Ambulatory Visit: Payer: Self-pay | Admitting: Cardiology

## 2012-11-09 ENCOUNTER — Ambulatory Visit (INDEPENDENT_AMBULATORY_CARE_PROVIDER_SITE_OTHER): Payer: Medicare Other | Admitting: *Deleted

## 2012-11-09 DIAGNOSIS — Z950 Presence of cardiac pacemaker: Secondary | ICD-10-CM

## 2012-11-09 DIAGNOSIS — I442 Atrioventricular block, complete: Secondary | ICD-10-CM

## 2012-11-24 LAB — REMOTE PACEMAKER DEVICE
AL THRESHOLD: 0.625 V
BAMS-0001: 175 {beats}/min
RV LEAD IMPEDENCE PM: 475 Ohm
RV LEAD THRESHOLD: 0.5 V

## 2012-11-27 ENCOUNTER — Encounter: Payer: Self-pay | Admitting: *Deleted

## 2012-11-30 ENCOUNTER — Encounter: Payer: Self-pay | Admitting: *Deleted

## 2012-12-02 ENCOUNTER — Other Ambulatory Visit: Payer: Self-pay | Admitting: Cardiology

## 2012-12-03 ENCOUNTER — Encounter: Payer: Self-pay | Admitting: Internal Medicine

## 2012-12-31 ENCOUNTER — Other Ambulatory Visit: Payer: Self-pay | Admitting: Cardiology

## 2013-01-06 ENCOUNTER — Emergency Department (HOSPITAL_COMMUNITY): Payer: Medicare Other

## 2013-01-06 ENCOUNTER — Encounter (HOSPITAL_COMMUNITY): Payer: Self-pay

## 2013-01-06 ENCOUNTER — Emergency Department (HOSPITAL_COMMUNITY)
Admission: EM | Admit: 2013-01-06 | Discharge: 2013-01-06 | Disposition: A | Discharge status: Home / Self Care | Payer: Medicare Other | Attending: Emergency Medicine | Admitting: Emergency Medicine

## 2013-01-06 DIAGNOSIS — R0602 Shortness of breath: Secondary | ICD-10-CM | POA: Insufficient documentation

## 2013-01-06 DIAGNOSIS — M1990 Unspecified osteoarthritis, unspecified site: Secondary | ICD-10-CM | POA: Insufficient documentation

## 2013-01-06 DIAGNOSIS — J3489 Other specified disorders of nose and nasal sinuses: Secondary | ICD-10-CM | POA: Insufficient documentation

## 2013-01-06 DIAGNOSIS — H5789 Other specified disorders of eye and adnexa: Secondary | ICD-10-CM | POA: Insufficient documentation

## 2013-01-06 DIAGNOSIS — M109 Gout, unspecified: Secondary | ICD-10-CM | POA: Insufficient documentation

## 2013-01-06 DIAGNOSIS — Z950 Presence of cardiac pacemaker: Secondary | ICD-10-CM | POA: Insufficient documentation

## 2013-01-06 DIAGNOSIS — I2510 Atherosclerotic heart disease of native coronary artery without angina pectoris: Secondary | ICD-10-CM | POA: Insufficient documentation

## 2013-01-06 DIAGNOSIS — Z79899 Other long term (current) drug therapy: Secondary | ICD-10-CM | POA: Insufficient documentation

## 2013-01-06 DIAGNOSIS — N400 Enlarged prostate without lower urinary tract symptoms: Secondary | ICD-10-CM | POA: Insufficient documentation

## 2013-01-06 DIAGNOSIS — F329 Major depressive disorder, single episode, unspecified: Secondary | ICD-10-CM | POA: Insufficient documentation

## 2013-01-06 DIAGNOSIS — Z8711 Personal history of peptic ulcer disease: Secondary | ICD-10-CM | POA: Insufficient documentation

## 2013-01-06 DIAGNOSIS — E785 Hyperlipidemia, unspecified: Secondary | ICD-10-CM | POA: Insufficient documentation

## 2013-01-06 DIAGNOSIS — R0989 Other specified symptoms and signs involving the circulatory and respiratory systems: Secondary | ICD-10-CM | POA: Insufficient documentation

## 2013-01-06 DIAGNOSIS — Z87442 Personal history of urinary calculi: Secondary | ICD-10-CM | POA: Insufficient documentation

## 2013-01-06 DIAGNOSIS — J159 Unspecified bacterial pneumonia: Secondary | ICD-10-CM | POA: Insufficient documentation

## 2013-01-06 DIAGNOSIS — R0789 Other chest pain: Secondary | ICD-10-CM | POA: Insufficient documentation

## 2013-01-06 DIAGNOSIS — R220 Localized swelling, mass and lump, head: Secondary | ICD-10-CM | POA: Insufficient documentation

## 2013-01-06 DIAGNOSIS — I5030 Unspecified diastolic (congestive) heart failure: Secondary | ICD-10-CM | POA: Insufficient documentation

## 2013-01-06 DIAGNOSIS — K219 Gastro-esophageal reflux disease without esophagitis: Secondary | ICD-10-CM | POA: Insufficient documentation

## 2013-01-06 DIAGNOSIS — J189 Pneumonia, unspecified organism: Secondary | ICD-10-CM

## 2013-01-06 DIAGNOSIS — Z8719 Personal history of other diseases of the digestive system: Secondary | ICD-10-CM | POA: Insufficient documentation

## 2013-01-06 DIAGNOSIS — Z8673 Personal history of transient ischemic attack (TIA), and cerebral infarction without residual deficits: Secondary | ICD-10-CM | POA: Insufficient documentation

## 2013-01-06 DIAGNOSIS — I10 Essential (primary) hypertension: Secondary | ICD-10-CM | POA: Insufficient documentation

## 2013-01-06 DIAGNOSIS — J029 Acute pharyngitis, unspecified: Secondary | ICD-10-CM | POA: Insufficient documentation

## 2013-01-06 DIAGNOSIS — Z87891 Personal history of nicotine dependence: Secondary | ICD-10-CM | POA: Insufficient documentation

## 2013-01-06 DIAGNOSIS — Z7982 Long term (current) use of aspirin: Secondary | ICD-10-CM | POA: Insufficient documentation

## 2013-01-06 DIAGNOSIS — Z8669 Personal history of other diseases of the nervous system and sense organs: Secondary | ICD-10-CM | POA: Insufficient documentation

## 2013-01-06 DIAGNOSIS — F3289 Other specified depressive episodes: Secondary | ICD-10-CM | POA: Insufficient documentation

## 2013-01-06 LAB — CBC WITH DIFFERENTIAL/PLATELET
Basophils Relative: 0 % (ref 0–1)
Eosinophils Absolute: 0.1 10*3/uL (ref 0.0–0.7)
Eosinophils Relative: 2 % (ref 0–5)
MCH: 34.8 pg — ABNORMAL HIGH (ref 26.0–34.0)
MCHC: 33.9 g/dL (ref 30.0–36.0)
MCV: 102.7 fL — ABNORMAL HIGH (ref 78.0–100.0)
Neutrophils Relative %: 78 % — ABNORMAL HIGH (ref 43–77)
Platelets: 156 10*3/uL (ref 150–400)
RBC: 3.33 MIL/uL — ABNORMAL LOW (ref 4.22–5.81)
RDW: 13.9 % (ref 11.5–15.5)

## 2013-01-06 LAB — BASIC METABOLIC PANEL
Calcium: 9.8 mg/dL (ref 8.4–10.5)
GFR calc Af Amer: 46 mL/min — ABNORMAL LOW (ref >90)
GFR calc non Af Amer: 40 mL/min — ABNORMAL LOW (ref >90)
Potassium: 4 mEq/L (ref 3.5–5.1)
Sodium: 142 mEq/L (ref 135–145)

## 2013-01-06 LAB — TROPONIN I: Troponin I: <0.3 ng/mL (ref <0.30)

## 2013-01-06 MED ORDER — PREDNISONE 20 MG PO TABS: ORAL_TABLET | ORAL | Status: DC

## 2013-01-06 MED ORDER — ALBUTEROL SULFATE (5 MG/ML) 0.5% IN NEBU
2.5000 mg | INHALATION_SOLUTION | Freq: Once | RESPIRATORY_TRACT | Status: AC
  Administered 2013-01-06: 2.5 mg via RESPIRATORY_TRACT
  Filled 2013-01-06: qty 1

## 2013-01-06 MED ORDER — LEVOFLOXACIN IN D5W 750 MG/150ML IV SOLN
750.0000 mg | Freq: Once | INTRAVENOUS | Status: AC
  Administered 2013-01-06: 750 mg via INTRAVENOUS
  Filled 2013-01-06: qty 150

## 2013-01-06 MED ORDER — LEVOFLOXACIN 750 MG PO TABS: 750.0000 mg | ORAL_TABLET | Freq: Every day | ORAL | Status: DC

## 2013-01-06 MED ORDER — ALBUTEROL SULFATE HFA 108 (90 BASE) MCG/ACT IN AERS: 1.0000 | INHALATION_SPRAY | Freq: Four times a day (QID) | RESPIRATORY_TRACT | Status: DC | PRN

## 2013-01-06 NOTE — ED Provider Notes (Signed)
History    This chart was scribed for [name] Crease, MD by Quintella Reichert, ED scribe.  This patient was seen in room APA05/AP[facility]05 and the patient's care was started at 8:39 AM.   CSN: [id number]  Arrival date & time 01/06/13  4098    Chief Complaint  Patient presents with  . SOB on exertion  . Cough    The history is provided by the patient. No language interpreter was used.     HPI Comments: [name] [name] is a 79 y.o. male with h/o CAD, CHF, HTN and hyperlipidemia who presents to the Emergency Department complaining of 6 days of progressively-worsening moderate SOB on exertion, with accompanying cough productive of yellow sputum, sore throat, left eye drainage, and congestion.  Pt speculates that his SOB is attributable to congestion.  He also notes chest tightness on deep breathing but he denies CP.  Symptoms are not exacerbated or relieved by anything.  Pt denies h/o asthma or emphysema.  He has a remote history of tobacco use.    Past Medical History  Diagnosis Date  . Osteoarthritis   . Gout   . Hypertension   . History of coronary artery disease   . GERD (gastroesophageal reflux disease)   . CHF (congestive heart failure)   . Diastolic dysfunction   . History of GI bleed   . History of pacemaker     Medtronic Adapta ADDro1  . Peptic ulcer disease   . BPH (benign prostatic hypertrophy)   . 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 Hyperlipidemia   . History of cerebrovascular disease   . Kidney stone   . Depression     Past Surgical History  Procedure Laterality Date  . Pacemaker insertion      Medtronic Adapta ADDro1  . Cholecystectomy    . Total knee arthroplasty    . Hernia repair    . Tonsillectomy    . Total knee arthroplasty  08/28/2011    Procedure: TOTAL KNEE ARTHROPLASTY;  Surgeon: Drucilla Schmidt, MD;  Location: [facility];  Service: Orthopedics;  Laterality: Left;    Family History  Problem Relation Age of Onset  . Diabetes  Mother   . Leukemia Father     History  Substance Use Topics  . Smoking status: Former Games developer  . Smokeless tobacco: Former Neurosurgeon    Quit date: 08/13/1976  . Alcohol Use: No     Review of Systems  HENT: Positive for congestion and sore throat.   Eyes: Positive for discharge.  Respiratory: Positive for cough and shortness of breath.   Cardiovascular: Negative for chest pain.  All other systems reviewed and are negative.      Allergies  Review of patient's allergies indicates no known allergies.  Home Medications   Current Outpatient Rx  Name  Route  Sig  Dispense  Refill  . allopurinol (ZYLOPRIM) 300 MG tablet   Oral   Take 300 mg by mouth daily.          Marland Kitchen aspirin 81 MG tablet   Oral   Take 81 mg by mouth daily.         Marland Kitchen atorvastatin (LIPITOR) 80 MG tablet   Oral   Take 40 mg by mouth every morning.          . finasteride (PROSCAR) 5 MG tablet   Oral   Take 5 mg by mouth daily.          Marland Kitchen  furosemide (LASIX) 40 MG tablet      TAKE ONE TABLET BY MOUTH ONCE DAILY   30 tablet   1   . losartan (COZAAR) 100 MG tablet   Oral   Take 100 mg by mouth daily.         . metoprolol succinate (TOPROL-XL) 25 MG 24 hr tablet   Oral   Take 25 mg by mouth 2 (two) times daily.         . Multiple Vitamins-Minerals (OCUVITE EXTRA) TABS   Oral   Take 1 tablet by mouth 2 (two) times daily.          . pantoprazole (PROTONIX) 40 MG tablet   Oral   Take 40 mg by mouth 2 (two) times daily.          . Polyethylene Glycol 3350 LIQD   Oral   Take 17 g by mouth daily.          . sertraline (ZOLOFT) 50 MG tablet   Oral   Take 50 mg by mouth at bedtime.          BP 158/66  Pulse 65  Temp(Src) 98.1 F (36.7 C) (Oral)  Resp 20  Ht 5\' 8"  (1.727 m)  Wt 188 lb (85.276 kg)  BMI 28.59 kg/m2  SpO2 98%  Physical Exam  Nursing note and vitals reviewed. Constitutional: He is oriented to person, place, and time. He appears well-developed and  well-nourished. No distress.  HENT:  Head: Normocephalic and atraumatic.  Right Ear: Hearing normal.  Left Ear: Hearing normal.  Nose: Nose normal.  Mouth/Throat: Oropharynx is clear and moist and mucous membranes are normal.  Eyes: Conjunctivae and EOM are normal. Pupils are equal, round, and reactive to light.  Neck: Normal range of motion. Neck supple.  Cardiovascular: Regular rhythm, S1 normal and S2 normal.  Exam reveals no gallop and no friction rub.   No murmur heard. Pulmonary/Chest: Effort normal and breath sounds normal. No respiratory distress. He has no wheezes. He has no rales. He exhibits no tenderness.  Abdominal: Soft. Normal appearance and bowel sounds are normal. There is no hepatosplenomegaly. There is no tenderness. There is no rebound, no guarding, no tenderness at McBurney's point and negative Murphy's sign. No hernia.  Musculoskeletal: Normal range of motion.  Neurological: He is alert and oriented to person, place, and time. He has normal strength. No cranial nerve deficit or sensory deficit. Coordination normal. GCS eye subscore is 4. GCS verbal subscore is 5. GCS motor subscore is 6.  Skin: Skin is warm, dry and intact. No rash noted. No cyanosis.  Psychiatric: He has a normal mood and affect. His speech is normal and behavior is normal. Thought content normal.    ED Course  Procedures (including critical care time)  DIAGNOSTIC STUDIES: Oxygen Saturation is 98% on room air, normal by my interpretation.    EKG:  Date: 01/06/2013  Rate: 61  Rhythm: AV dual paced     COORDINATION OF CARE: 8:42 AM-Discussed treatment plan which includes CXR, EKG and labs with pt at bedside and pt agreed to plan.    Results for orders placed during the hospital encounter of 01/06/13  CBC WITH DIFFERENTIAL      Result Value Range   WBC 8.0  4.0 - 10.5 K/uL   RBC 3.33 (*) 4.22 - 5.81 MIL/uL   Hemoglobin 11.6 (*) 13.0 - 17.0 g/dL   HCT 16.1 (*) 09.6 - 04.5 %   MCV 102.7  (*)  78.0 - 100.0 fL   MCH 34.8 (*) 26.0 - 34.0 pg   MCHC 33.9  30.0 - 36.0 g/dL   RDW 40.9  81.1 - 91.4 %   Platelets 156  150 - 400 K/uL   Neutrophils Relative % 78 (*) 43 - 77 %   Neutro Abs 6.2  1.7 - 7.7 K/uL   Lymphocytes Relative 11 (*) 12 - 46 %   Lymphs Abs 0.9  0.7 - 4.0 K/uL   Monocytes Relative 9  3 - 12 %   Monocytes Absolute 0.7  0.1 - 1.0 K/uL   Eosinophils Relative 2  0 - 5 %   Eosinophils Absolute 0.1  0.0 - 0.7 K/uL   Basophils Relative 0  0 - 1 %   Basophils Absolute 0.0  0.0 - 0.1 K/uL  BASIC METABOLIC PANEL      Result Value Range   Sodium 142  135 - 145 mEq/L   Potassium 4.0  3.5 - 5.1 mEq/L   Chloride 103  96 - 112 mEq/L   CO2 32  19 - 32 mEq/L   Glucose, Bld 128 (*) 70 - 99 mg/dL   BUN 33 (*) 6 - 23 mg/dL   Creatinine, Ser 7.82 (*) 0.50 - 1.35 mg/dL   Calcium 9.8  8.4 - 95.6 mg/dL   GFR calc non Af Amer 40 (*) >90 mL/min   GFR calc Af Amer 46 (*) >90 mL/min  PRO B NATRIURETIC PEPTIDE      Result Value Range   Pro B Natriuretic peptide (BNP) 575.2 (*) 0 - 450 pg/mL  TROPONIN I      Result Value Range   Troponin I <0.30  <0.30 ng/mL     Dg Chest 2 View  01/06/2013   *RADIOLOGY REPORT*  Clinical Data: Cough and congestion.  CHEST - 2 VIEW  Comparison: 08/14/2011.  Findings: Trachea is midline.  Heart size stable.  Left subclavian pacemaker lead tips project over the right atrium and right ventricle.  Lungs are somewhat low in volume with streaky densities at the lung bases, left greater than right.  Right hemidiaphragm is elevated, as before.  No pleural fluid.  Degenerative changes are seen in the spine.  IMPRESSION: Streaky densities at the lung bases can be seen with atelectasis. Difficult to exclude early bronchopneumonia.   Original Report Authenticated By: Leanna Battles, M.D.       Diagnosis: Community acquired pneumonia  MDM  Patient presents with cough and chest congestion. Symptoms have been present for one week. Patient also reports swelling  around his eyes, sore throat and nasal congestion. The symptoms seemed allergic in nature, but chest x-ray shows possible early bronchopneumonia. Patient's oxygen saturation is normal. Air movement is excellent. Patient will be treated with IV Levaquin here in the ER and discharge, careful followup with primary doctor in the next one to 2 days. Patient counseled to return to the ER if he has any worsening symptoms including difficulty breathing.   I personally performed the services described in this documentation, which was scribed in my presence. The recorded information has been reviewed and is accurate.       [name] Crease, MD 01/06/13 1128

## 2013-01-06 NOTE — ED Notes (Signed)
Pt c/o feeling nauseated today.  C/O chest feeling sore with coughing.

## 2013-01-06 NOTE — ED Notes (Signed)
Pt stood to use urinal, voided clear yellow, tolerated well. Urinal emptied and returned to pt's bedside. Iv antibiotics infusing. Pt will be ready for discharge when meds are completed.

## 2013-01-06 NOTE — ED Notes (Signed)
Pt c/o productive cough with yellow sputum x 1 week.  C/O sore throat, chest sore, and left eye draining.  Pt also reports worsening sob with exertion.

## 2013-01-15 ENCOUNTER — Inpatient Hospital Stay (HOSPITAL_COMMUNITY)
Admission: EM | Admit: 2013-01-15 | Discharge: 2013-01-17 | DRG: 292 | Disposition: A | Discharge status: Home Health | Payer: Medicare Other | Attending: Internal Medicine | Admitting: Internal Medicine

## 2013-01-15 ENCOUNTER — Encounter (HOSPITAL_COMMUNITY): Payer: Self-pay | Admitting: *Deleted

## 2013-01-15 ENCOUNTER — Emergency Department (HOSPITAL_COMMUNITY): Payer: Medicare Other

## 2013-01-15 DIAGNOSIS — E785 Hyperlipidemia, unspecified: Secondary | ICD-10-CM | POA: Diagnosis present

## 2013-01-15 DIAGNOSIS — I5032 Chronic diastolic (congestive) heart failure: Secondary | ICD-10-CM

## 2013-01-15 DIAGNOSIS — Z79899 Other long term (current) drug therapy: Secondary | ICD-10-CM

## 2013-01-15 DIAGNOSIS — N189 Chronic kidney disease, unspecified: Secondary | ICD-10-CM

## 2013-01-15 DIAGNOSIS — F3289 Other specified depressive episodes: Secondary | ICD-10-CM | POA: Diagnosis present

## 2013-01-15 DIAGNOSIS — H3530 Unspecified macular degeneration: Secondary | ICD-10-CM | POA: Diagnosis present

## 2013-01-15 DIAGNOSIS — I5033 Acute on chronic diastolic (congestive) heart failure: Principal | ICD-10-CM | POA: Diagnosis present

## 2013-01-15 DIAGNOSIS — I679 Cerebrovascular disease, unspecified: Secondary | ICD-10-CM | POA: Diagnosis present

## 2013-01-15 DIAGNOSIS — Z87891 Personal history of nicotine dependence: Secondary | ICD-10-CM

## 2013-01-15 DIAGNOSIS — R0600 Dyspnea, unspecified: Secondary | ICD-10-CM

## 2013-01-15 DIAGNOSIS — M1990 Unspecified osteoarthritis, unspecified site: Secondary | ICD-10-CM | POA: Diagnosis present

## 2013-01-15 DIAGNOSIS — N183 Chronic kidney disease, stage 3 unspecified: Secondary | ICD-10-CM | POA: Diagnosis present

## 2013-01-15 DIAGNOSIS — I509 Heart failure, unspecified: Secondary | ICD-10-CM | POA: Diagnosis present

## 2013-01-15 DIAGNOSIS — I129 Hypertensive chronic kidney disease with stage 1 through stage 4 chronic kidney disease, or unspecified chronic kidney disease: Secondary | ICD-10-CM | POA: Diagnosis present

## 2013-01-15 DIAGNOSIS — R0609 Other forms of dyspnea: Secondary | ICD-10-CM

## 2013-01-15 DIAGNOSIS — M109 Gout, unspecified: Secondary | ICD-10-CM | POA: Diagnosis present

## 2013-01-15 DIAGNOSIS — N179 Acute kidney failure, unspecified: Secondary | ICD-10-CM | POA: Diagnosis present

## 2013-01-15 DIAGNOSIS — K219 Gastro-esophageal reflux disease without esophagitis: Secondary | ICD-10-CM | POA: Diagnosis present

## 2013-01-15 DIAGNOSIS — I2510 Atherosclerotic heart disease of native coronary artery without angina pectoris: Secondary | ICD-10-CM | POA: Diagnosis present

## 2013-01-15 DIAGNOSIS — R0789 Other chest pain: Secondary | ICD-10-CM | POA: Diagnosis present

## 2013-01-15 DIAGNOSIS — F329 Major depressive disorder, single episode, unspecified: Secondary | ICD-10-CM | POA: Diagnosis present

## 2013-01-15 DIAGNOSIS — Z950 Presence of cardiac pacemaker: Secondary | ICD-10-CM | POA: Diagnosis present

## 2013-01-15 DIAGNOSIS — E876 Hypokalemia: Secondary | ICD-10-CM | POA: Diagnosis present

## 2013-01-15 DIAGNOSIS — M7989 Other specified soft tissue disorders: Secondary | ICD-10-CM

## 2013-01-15 DIAGNOSIS — I10 Essential (primary) hypertension: Secondary | ICD-10-CM

## 2013-01-15 DIAGNOSIS — Z86718 Personal history of other venous thrombosis and embolism: Secondary | ICD-10-CM

## 2013-01-15 LAB — BASIC METABOLIC PANEL
CO2: 30 mEq/L (ref 19–32)
Calcium: 8.8 mg/dL (ref 8.4–10.5)
Creatinine, Ser: 2.27 mg/dL — ABNORMAL HIGH (ref 0.50–1.35)
GFR calc Af Amer: 28 mL/min — ABNORMAL LOW (ref >90)
Sodium: 139 mEq/L (ref 135–145)

## 2013-01-15 LAB — CBC WITH DIFFERENTIAL/PLATELET
Basophils Absolute: 0 10*3/uL (ref 0.0–0.1)
Basophils Relative: 0 % (ref 0–1)
Eosinophils Relative: 1 % (ref 0–5)
HCT: 30.8 % — ABNORMAL LOW (ref 39.0–52.0)
Lymphocytes Relative: 17 % (ref 12–46)
MCHC: 34.1 g/dL (ref 30.0–36.0)
MCV: 101.7 fL — ABNORMAL HIGH (ref 78.0–100.0)
Monocytes Absolute: 0.6 10*3/uL (ref 0.1–1.0)
Neutro Abs: 5.2 10*3/uL (ref 1.7–7.7)
Platelets: 133 10*3/uL — ABNORMAL LOW (ref 150–400)
RDW: 14.3 % (ref 11.5–15.5)
WBC: 7.1 10*3/uL (ref 4.0–10.5)

## 2013-01-15 LAB — URINALYSIS, ROUTINE W REFLEX MICROSCOPIC
Ketones, ur: NEGATIVE mg/dL
Leukocytes, UA: NEGATIVE
Nitrite: NEGATIVE
Protein, ur: NEGATIVE mg/dL
Urobilinogen, UA: 0.2 mg/dL (ref 0.0–1.0)

## 2013-01-15 LAB — PROTIME-INR: Prothrombin Time: 13.6 seconds (ref 11.6–15.2)

## 2013-01-15 MED ORDER — FINASTERIDE 5 MG PO TABS
5.0000 mg | ORAL_TABLET | Freq: Every day | ORAL | Status: DC
  Administered 2013-01-16 – 2013-01-17 (×2): 5 mg via ORAL
  Filled 2013-01-15 (×3): qty 1

## 2013-01-15 MED ORDER — ONDANSETRON HCL 4 MG/2ML IJ SOLN: 4.0000 mg | Freq: Four times a day (QID) | INTRAMUSCULAR | Status: DC | PRN

## 2013-01-15 MED ORDER — HEPARIN BOLUS VIA INFUSION
5000.0000 [IU] | Freq: Once | INTRAVENOUS | Status: AC
  Administered 2013-01-15: 5000 [IU] via INTRAVENOUS

## 2013-01-15 MED ORDER — METOPROLOL TARTRATE 25 MG PO TABS
25.0000 mg | ORAL_TABLET | Freq: Two times a day (BID) | ORAL | Status: DC
  Administered 2013-01-16 – 2013-01-17 (×3): 25 mg via ORAL
  Filled 2013-01-15 (×4): qty 1

## 2013-01-15 MED ORDER — SODIUM CHLORIDE 0.9 % IV SOLN: INTRAVENOUS | Status: AC

## 2013-01-15 MED ORDER — SERTRALINE HCL 50 MG PO TABS
100.0000 mg | ORAL_TABLET | Freq: Every morning | ORAL | Status: DC
  Administered 2013-01-16 – 2013-01-17 (×2): 100 mg via ORAL
  Filled 2013-01-15 (×2): qty 2
  Filled 2013-01-15: qty 1

## 2013-01-15 MED ORDER — ACETAMINOPHEN 325 MG PO TABS
650.0000 mg | ORAL_TABLET | Freq: Four times a day (QID) | ORAL | Status: DC | PRN
  Administered 2013-01-16: 650 mg via ORAL
  Filled 2013-01-15: qty 2

## 2013-01-15 MED ORDER — ALBUTEROL SULFATE HFA 108 (90 BASE) MCG/ACT IN AERS: 1.0000 | INHALATION_SPRAY | Freq: Four times a day (QID) | RESPIRATORY_TRACT | Status: DC | PRN

## 2013-01-15 MED ORDER — SODIUM CHLORIDE 0.9 % IV SOLN
INTRAVENOUS | Status: DC
  Administered 2013-01-15: 19:00:00 via INTRAVENOUS

## 2013-01-15 MED ORDER — HEPARIN (PORCINE) IN NACL 100-0.45 UNIT/ML-% IJ SOLN
1200.0000 [IU]/h | INTRAMUSCULAR | Status: DC
  Administered 2013-01-15: 1400 [IU]/h via INTRAVENOUS
  Administered 2013-01-16: 1200 [IU]/h via INTRAVENOUS
  Filled 2013-01-15: qty 250

## 2013-01-15 MED ORDER — ONDANSETRON HCL 4 MG PO TABS: 4.0000 mg | ORAL_TABLET | Freq: Four times a day (QID) | ORAL | Status: DC | PRN

## 2013-01-15 MED ORDER — ALLOPURINOL 300 MG PO TABS
300.0000 mg | ORAL_TABLET | Freq: Every day | ORAL | Status: DC
  Administered 2013-01-16 – 2013-01-17 (×2): 300 mg via ORAL
  Filled 2013-01-15 (×2): qty 1

## 2013-01-15 MED ORDER — ACETAMINOPHEN 650 MG RE SUPP: 650.0000 mg | Freq: Four times a day (QID) | RECTAL | Status: DC | PRN

## 2013-01-15 MED ORDER — FUROSEMIDE 40 MG PO TABS: 40.0000 mg | ORAL_TABLET | Freq: Every day | ORAL | Status: DC

## 2013-01-15 MED ORDER — ATORVASTATIN CALCIUM 40 MG PO TABS
40.0000 mg | ORAL_TABLET | Freq: Every day | ORAL | Status: DC
  Administered 2013-01-15 – 2013-01-16 (×2): 40 mg via ORAL
  Filled 2013-01-15 (×2): qty 1

## 2013-01-15 MED ORDER — ASPIRIN 81 MG PO CHEW
81.0000 mg | CHEWABLE_TABLET | Freq: Every morning | ORAL | Status: DC
  Administered 2013-01-16 – 2013-01-17 (×2): 81 mg via ORAL
  Filled 2013-01-15 (×2): qty 1

## 2013-01-15 MED ORDER — PANTOPRAZOLE SODIUM 40 MG PO TBEC
40.0000 mg | DELAYED_RELEASE_TABLET | Freq: Two times a day (BID) | ORAL | Status: DC
  Administered 2013-01-15 – 2013-01-17 (×4): 40 mg via ORAL
  Filled 2013-01-15 (×4): qty 1

## 2013-01-15 MED ORDER — SODIUM CHLORIDE 0.9 % IJ SOLN
3.0000 mL | Freq: Two times a day (BID) | INTRAMUSCULAR | Status: DC
  Administered 2013-01-15 – 2013-01-17 (×3): 3 mL via INTRAVENOUS
  Filled 2013-01-15: qty 3

## 2013-01-15 NOTE — Progress Notes (Signed)
ANTICOAGULATION CONSULT NOTE - Initial Consult  Pharmacy Consult for Heparin Indication: pulmonary embolism  No Known Allergies  Patient Measurements: Height: 5\' 8"  (172.7 cm) Weight: 190 lb (86.183 kg) IBW/kg (Calculated) : 68.4  Vital Signs: Temp: 98.1 F (36.7 C) (07/18 1813) Temp src: Oral (07/18 1813) BP: 117/86 mmHg (07/18 1813) Pulse Rate: 64 (07/18 1813)  Labs:  Recent Labs  01/15/13 1910  HGB 10.5*  HCT 30.8*  PLT 133*  APTT 30  LABPROT 13.6  INR 1.06    Estimated Creatinine Clearance: 37.5 ml/min (by C-G formula based on Cr of 1.51).   Medical History: Past Medical History  Diagnosis Date  . Osteoarthritis   . Gout   . Hypertension   . History of coronary artery disease   . GERD (gastroesophageal reflux disease)   . CHF (congestive heart failure)   . Diastolic dysfunction   . History of GI bleed   . History of pacemaker     Medtronic Adapta ADDro1  . Peptic ulcer disease   . BPH (benign prostatic hypertrophy)   . 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 Hyperlipidemia   . History of cerebrovascular disease   . Kidney stone   . Depression     Medications:  reviewed  Assessment: Initiating heparin for PE.  CBC ok   Goal of Therapy: Heparin level 0.3-0.7    Plan:  Heparin bolus 5000 units then 1400 units per hr Heparin level daily CBC daily  Valrie Hart A 01/15/2013,7:45 PM

## 2013-01-15 NOTE — ED Notes (Addendum)
R leg swelling noticed yesterday w/purple discoloration posteriorly at ankle and reddening of shin. Has had previous DVT in R leg.   L leg is swollen, but not as much as the R.

## 2013-01-15 NOTE — ED Provider Notes (Signed)
History    This chart was scribed for Flint Melter, MD, by Frederik Pear, ED scribe. The patient was seen in room [facility]16A/[facility]16A and the patient's care was started at 1831.   CSN: [id number] Arrival date & time 01/15/13  1805  First MD Initiated Contact with Patient 01/15/13 1831     Chief Complaint  Patient presents with  . Leg Swelling   (Consider location/radiation/quality/duration/timing/severity/associated sxs/prior Treatment) The history is provided by the patient and medical records. No language interpreter was used.    HPI Comments: [name] [name] is a 79 y.o. 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 Ambulatory. He complains of tightness in the top of the right foot with dorsi and plantar flexion. He reports cyanosis was present earlier in the day in the right lower leg, but has since resolved. Denies fever, emesis, and abdominal pain. He reports a h/o of 2 previous DVTs in the right lower leg from many years ago. Denies currently taking blood thinners.   He was seen in the ED 9 days ago for pneumonia. He reports he is still experiencing chest tightness and cough, but his SOB was improving since the visit until it significantly worsened today. He was seen by Dr. Ivery Quale 2 day after his ED visit. He reports he typically takes 50 mg of Lasix daily, but was told to double his dosage if his legs began to swell. He reports he has taken 80 mg yesterday and earlier today. He is followed by Dr. Elvis Coil for kidney disease and his creatinine is 3 at baseline.    He lives alone,  but is next door to his daughter.  Past Medical History  Diagnosis Date  . Osteoarthritis   . Gout   . Hypertension   . History of coronary artery disease   . GERD (gastroesophageal reflux disease)   . CHF (congestive heart failure)   . Diastolic dysfunction    . History of GI bleed   . History of pacemaker     Medtronic Adapta ADDro1  . Peptic ulcer disease   . BPH (benign prostatic hypertrophy)   . 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 Hyperlipidemia   . History of cerebrovascular disease   . Kidney stone   . Depression    Past Surgical History  Procedure Laterality Date  . Pacemaker insertion      Medtronic Adapta ADDro1  . Cholecystectomy    . Total knee arthroplasty    . Hernia repair    . Tonsillectomy    . Total knee arthroplasty  08/28/2011    Procedure: TOTAL KNEE ARTHROPLASTY;  Surgeon: Drucilla Schmidt, MD;  Location: [facility];  Service: Orthopedics;  Laterality: Left;   Family History  Problem Relation Age of Onset  . Diabetes Mother   . Leukemia Father    History  Substance Use Topics  . Smoking status: Former Games developer  . Smokeless tobacco: Former Neurosurgeon    Quit date: 08/13/1976  . Alcohol Use: No    Review of Systems  Constitutional: Negative for fever.  Respiratory: Positive for cough and shortness of breath.   Cardiovascular: Positive for chest pain (tightness).  Gastrointestinal: Negative for vomiting and abdominal pain.  Musculoskeletal: Positive for myalgias (lower legs).       Edema  All other systems reviewed  and are negative.   Allergies  Review of patient's allergies indicates no known allergies.  Home Medications   No current outpatient prescriptions on file. BP 116/61  Pulse 65  Temp(Src) 98 F (36.7 C) (Oral)  Resp 20  Ht 5\' 8"  (1.727 m)  Wt 199 lb (90.266 kg)  BMI 30.26 kg/m2  SpO2 100% Physical Exam  Nursing note and vitals reviewed. Constitutional: He is oriented to person, place, and time. He appears well-developed and well-nourished.  HENT:  Head: Normocephalic and atraumatic.  Right Ear: External ear normal.  Left Ear: External ear normal.  Eyes: Conjunctivae and EOM are normal. Pupils are equal, round, and reactive to light.  Neck: Normal range of motion and  phonation normal. Neck supple.  Cardiovascular: Normal rate, regular rhythm, normal heart sounds and intact distal pulses.   2/6 systolic murmur.   Pulmonary/Chest: Effort normal. He has wheezes. He exhibits no bony tenderness.  Expiratory wheezes on the left. No rhonchi or rales.   Abdominal: Soft. Normal appearance. There is tenderness.  Mild LLQ tenderness bilaterally.   Musculoskeletal: Normal range of motion. He exhibits edema and tenderness.  Bilateral swelling that is worse on the right. Anterior tibia is erythematous. Posterolateral ecchymosis and erythema of the anterior tibia on the right.   Neurological: He is alert and oriented to person, place, and time. He has normal strength. No cranial nerve deficit or sensory deficit. He exhibits normal muscle tone. Coordination normal.  Skin: Skin is warm, dry and intact.  Psychiatric: He has a normal mood and affect. His behavior is normal. Judgment and thought content normal.    ED Course  Procedures (including critical care time)  DIAGNOSTIC STUDIES: Oxygen Saturation is 100% on room air, normal by my interpretation.    COORDINATION OF CARE:  18:45- Discussed planned course of treatment with the patient, including a urine culture, chest X-ray, blood work, UA with microscopic reflex, APTT, Protime-INR, EKG, and IV fluids, who is agreeable at this time.  20:14- Discussed that no pneumonia was found on the X-ray. Unable to perform any testing on his chest tonight due to his high creatinine. Discussed keeping him overnight for concern for a DVT. He is agreeable at this time.   20:25- Consult with Dr. Osvaldo Shipper in regards to admitting the pt. The pt would prefer to be admitted at [facility] rather than [facility].     Date: 01/15/13  Rate: 61  Rhythm: AV pacing  QRS Axis: Not applicable  PR and QT Intervals: QT normal  ST/T Wave abnormalities: normal  PR and QRS Conduction Disutrbances: Not applicable  Narrative Interpretation:   Old EKG  Reviewed: unchanged- 01/06/13   Medications  heparin ADULT infusion 100 units/mL (25000 units/250 mL) (1,400 Units/hr Intravenous New Bag/Given 01/15/13 1952)  albuterol (PROVENTIL HFA;VENTOLIN HFA) 108 (90 BASE) MCG/ACT inhaler 1-2 puff (not administered)  furosemide (LASIX) tablet 40 mg (not administered)  metoprolol tartrate (LOPRESSOR) tablet 25 mg (not administered)  sertraline (ZOLOFT) tablet 100 mg (not administered)  aspirin chewable tablet 81 mg (not administered)  allopurinol (ZYLOPRIM) tablet 300 mg (not administered)  atorvastatin (LIPITOR) tablet 40 mg (40 mg Oral Given 01/15/13 2349)  finasteride (PROSCAR) tablet 5 mg (not administered)  pantoprazole (PROTONIX) EC tablet 40 mg (40 mg Oral Given 01/15/13 2349)  sodium chloride 0.9 % injection 3 mL (3 mLs Intravenous Given 01/15/13 2350)  0.9 %  sodium chloride infusion ( Intravenous Rate/Dose Change 01/15/13 2324)  acetaminophen (TYLENOL) tablet 650 mg (not administered)  Or  acetaminophen (TYLENOL) suppository 650 mg (not administered)  ondansetron (ZOFRAN) tablet 4 mg (not administered)    Or  ondansetron (ZOFRAN) injection 4 mg (not administered)  heparin bolus via infusion 5,000 Units (5,000 Units Intravenous New Bag/Given 01/15/13 1953)   Patient Vitals for the past 24 hrs:  BP Temp Temp src Pulse Resp SpO2 Height Weight  01/15/13 2202 116/61 mmHg 98 F (36.7 C) Oral 65 20 100 % - 199 lb (90.266 kg)  01/15/13 2124 129/93 mmHg 98.4 F (36.9 C) Oral 66 20 99 % - -  01/15/13 1813 117/86 mmHg 98.1 F (36.7 C) Oral 64 16 100 % 5\' 8"  (1.727 m) 190 lb (86.183 kg)    CRITICAL CARE Performed by: Mancel Bale L Total critical care time:35 minutes Critical care time was exclusive of separately billable procedures and treating other patients. Critical care was necessary to treat or prevent imminent or life-threatening deterioration. Critical care was time spent personally by me on the following activities: development of  treatment plan with patient and/or surrogate as well as nursing, discussions with consultants, evaluation of patient's response to treatment, examination of patient, obtaining history from patient or surrogate, ordering and performing treatments and interventions, ordering and review of laboratory studies, ordering and review of radiographic studies, pulse oximetry and re-evaluation of patient's condition.    Labs Reviewed  CBC WITH DIFFERENTIAL - Abnormal; Notable for the following:    RBC 3.03 (*)    Hemoglobin 10.5 (*)    HCT 30.8 (*)    MCV 101.7 (*)    MCH 34.7 (*)    Platelets 133 (*)    All other components within normal limits  BASIC METABOLIC PANEL - Abnormal; Notable for the following:    Glucose, Bld 113 (*)    BUN 56 (*)    Creatinine, Ser 2.27 (*)    GFR calc non Af Amer 24 (*)    GFR calc Af Amer 28 (*)    All other components within normal limits  URINE CULTURE  URINALYSIS, ROUTINE W REFLEX MICROSCOPIC  PROTIME-INR  APTT  TROPONIN I  HEPARIN LEVEL (UNFRACTIONATED)  CBC  TROPONIN I  TROPONIN I  COMPREHENSIVE METABOLIC PANEL   Dg Chest 2 View  01/15/2013   *RADIOLOGY REPORT*  Clinical Data: Shortness of breath  CHEST - 2 VIEW  Comparison: 01/06/2013  Findings: Left-sided pacemaker noted.  Deformity of the right distal clavicle is stable.  Mild enlargement cardiomediastinal silhouette again noted without evidence for edema. Lung volumes are low with crowding of the bronchovascular markings.  Allowing for this, no focal pulmonary opacity is identified.  No pleural effusion.  The upper chest is omitted from the lateral projection. No acute osseous abnormality.  IMPRESSION: Hypoaeration without focal acute finding.  Stable cardiomegaly without edema.   Original Report Authenticated By: Christiana Pellant, M.D.   1. Leg swelling   2. Dyspnea   3. Acute on chronic renal failure   4. CKD (chronic kidney disease) stage 3, GFR 30-59 ml/min     MDM  Leg swelling, consistent  with DVT. Shortness of breath , possibly consistent with PE. Unable to perform Doppler to assess for DVT, due to the time of day. Chest CT, for PE, cannot be performed due to renal insufficiency. The patient is hemodynamically stable. He needs anticoagulation until further testing can be done. Empiric heparin anticoagulation begun for venous thromboembolism with possible PE. Admission arranged.  Nursing Notes Reviewed/ Care Coordinated, and agree without changes. Applicable Imaging Reviewed.  Interpretation of Laboratory Data incorporated into ED treatment  I personally performed the services described in this documentation, which was scribed in my presence. The recorded information has been reviewed and is accurate.       Flint Melter, MD 01/16/13 [phone]

## 2013-01-15 NOTE — H&P (Signed)
[facility] History and Physical  [name] [name] ZOX:[id number] DOB: 1923-09-07 DOA: 01/15/2013   PCP: [name], MD  Specialists: He is followed by Dr. Jens Som, with cardiology. He's also followed by Dr. Elvis Coil with nephrology  Chief Complaint: Leg swelling, shortness of breath  HPI: [name] [name] is a 78 y.o. male with a past medical history of DVT in 2004, pacemaker, chronic kidney disease stage III, mild coronary artery disease, diastolic dysfunction, who was in his usual state of health till about a week and a half ago, when he started having cough with yellowish expectoration. He was seen in the emergency department and was prescribed antibiotics for presumed pneumonia. He was having chest tightness, as well at that time. Troponin was normal. He started improving. However, in the last 2 days he's noticed that his right leg is more swollen than the left. He has been more sedentary in the last couple of weeks. His breathing has also not been as good in the last couple days. The chest tightness has been present for the last week and a half however, is still persisting. It is 5/10 in intensity and located in central chest. Denies any fever or chills. No nausea, vomiting, no dizziness or lightheadedness. The chest tightness does not increase with deep breathing or cough. He has some cough, but it is dry. He took extra dose of Lasix yesterday because of his difficulty breathing. He has been weak and tired lately. He denies any falls. Denies any focal weakness.  Home Medications: Prior to Admission medications   Medication Sig Start Date End Date Taking? Authorizing Provider  allopurinol (ZYLOPRIM) 300 MG tablet Take 300 mg by mouth daily.    Yes Historical Provider, MD  aspirin 81 MG tablet Take 81 mg by mouth every morning.    Yes Historical Provider, MD  atorvastatin (LIPITOR) 80 MG tablet Take 40 mg by mouth at bedtime.    Yes Historical Provider, MD  finasteride (PROSCAR)  5 MG tablet Take 5 mg by mouth daily.    Yes Historical Provider, MD  furosemide (LASIX) 40 MG tablet Take 40 mg by mouth daily.   Yes Historical Provider, MD  losartan (COZAAR) 100 MG tablet Take 100 mg by mouth every evening.    Yes Historical Provider, MD  metoprolol (LOPRESSOR) 50 MG tablet Take 25 mg by mouth 2 (two) times daily.   Yes Historical Provider, MD  Multiple Vitamins-Minerals (OCUVITE EXTRA) TABS Take 2 tablets by mouth every morning.    Yes Historical Provider, MD  pantoprazole (PROTONIX) 40 MG tablet Take 40 mg by mouth 2 (two) times daily.    Yes Historical Provider, MD  polyethylene glycol powder (GLYCOLAX/MIRALAX) powder Take 17 g by mouth daily as needed (for constipation).   Yes Historical Provider, MD  sertraline (ZOLOFT) 100 MG tablet Take 100 mg by mouth every morning.   Yes Historical Provider, MD  albuterol (PROVENTIL HFA;VENTOLIN HFA) 108 (90 BASE) MCG/ACT inhaler Inhale 1-2 puffs into the lungs every 6 (six) hours as needed for wheezing. 01/06/13   Gilda Crease, MD  guaiFENesin (MUCINEX) 600 MG 12 hr tablet Take 600-1,200 mg by mouth daily as needed for congestion.    Historical Provider, MD  levofloxacin (LEVAQUIN) 750 MG tablet Take 1 tablet (750 mg total) by mouth daily. X 7 days - Start on July 10. 01/06/13   Gilda Crease, MD  predniSONE (DELTASONE) 20 MG tablet 3 tabs po day one, then 2 po daily x 4 days  01/06/13   Gilda Crease, MD    Allergies: No Known Allergies  Past Medical History: Past Medical History  Diagnosis Date  . Osteoarthritis   . Gout   . Hypertension   . History of coronary artery disease   . GERD (gastroesophageal reflux disease)   . CHF (congestive heart failure)   . Diastolic dysfunction   . History of GI bleed   . History of pacemaker     Medtronic Adapta ADDro1  . Peptic ulcer disease   . BPH (benign prostatic hypertrophy)   . 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 Hyperlipidemia   . History  of cerebrovascular disease   . Kidney stone   . Depression     Past Surgical History  Procedure Laterality Date  . Pacemaker insertion      Medtronic Adapta ADDro1  . Cholecystectomy    . Total knee arthroplasty    . Hernia repair    . Tonsillectomy    . Total knee arthroplasty  08/28/2011    Procedure: TOTAL KNEE ARTHROPLASTY;  Surgeon: Drucilla Schmidt, MD;  Location: [facility];  Service: Orthopedics;  Laterality: Left;    Social History:  reports that he has quit smoking. He quit smokeless tobacco use about 36 years ago. He reports that he does not drink alcohol or use illicit drugs.  Living Situation: Lives alone, but his daughter lives close by Activity Level: Usually independent with daily activities. He cooks and cleans   Family History:  Family History  Problem Relation Age of Onset  . Diabetes Mother   . Leukemia Father      Review of Systems - History obtained from the patient General ROS: positive for  - fatigue Psychological ROS: negative Ophthalmic ROS: negative ENT ROS: negative Allergy and Immunology ROS: negative Hematological and Lymphatic ROS: negative Endocrine ROS: negative Respiratory ROS: as in hpi Cardiovascular ROS: as in hpi Gastrointestinal ROS: no abdominal pain, change in bowel habits, or black or bloody stools Genito-Urinary ROS: no dysuria, trouble voiding, or hematuria Musculoskeletal ROS: negative Neurological ROS: no TIA or stroke symptoms Dermatological ROS: negative  Physical Examination  Filed Vitals:   01/15/13 1813 01/15/13 2124 01/15/13 2202  BP: 117/86 129/93 116/61  Pulse: 64 66 65  Temp: 98.1 F (36.7 C) 98.4 F (36.9 C) 98 F (36.7 C)  TempSrc: Oral Oral Oral  Resp: 16 20 20   Height: 5\' 8"  (1.727 m)    Weight: 86.183 kg (190 lb)  90.266 kg (199 lb)  SpO2: 100% 99% 100%    General appearance: alert, cooperative, appears stated age and no distress Head: Normocephalic, without obvious abnormality, atraumatic Eyes:  conjunctivae/corneas clear. PERRL, EOM's intact.  Throat: lips, mucosa, and tongue normal; teeth and gums normal Neck: no adenopathy, no carotid bruit, no JVD, supple, symmetrical, trachea midline and thyroid not enlarged, symmetric, no tenderness/mass/nodules Resp: clear to auscultation bilaterally Cardio: regular rate and rhythm, S1, S2 normal, no murmur, click, rub or gallop GI: soft, non-tender; bowel sounds normal; no masses,  no organomegaly Extremities: He has erythema and swelling of the right leg, which is much more than the left. He has some pitting edema as well in both the lower extremities. Peripheral pulses are palpable. Skin: Some erythema is noted in the right leg Lymph nodes: Cervical, supraclavicular, and axillary nodes normal. Neurologic: He is alert and oriented x3. No focal neurological deficits are present. Skin is intact. Motor strength is equal bilaterally  Laboratory Data:  Results for orders placed during the hospital encounter of 01/15/13 (from the past 48 hour(s))  CBC WITH DIFFERENTIAL     Status: Abnormal   Collection Time    01/15/13  7:10 PM      Result Value Range   WBC 7.1  4.0 - 10.5 K/uL   RBC 3.03 (*) 4.22 - 5.81 MIL/uL   Hemoglobin 10.5 (*) 13.0 - 17.0 g/dL   HCT 16.1 (*) 09.6 - 04.5 %   MCV 101.7 (*) 78.0 - 100.0 fL   MCH 34.7 (*) 26.0 - 34.0 pg   MCHC 34.1  30.0 - 36.0 g/dL   RDW 40.9  81.1 - 91.4 %   Platelets 133 (*) 150 - 400 K/uL   Neutrophils Relative % 74  43 - 77 %   Neutro Abs 5.2  1.7 - 7.7 K/uL   Lymphocytes Relative 17  12 - 46 %   Lymphs Abs 1.2  0.7 - 4.0 K/uL   Monocytes Relative 8  3 - 12 %   Monocytes Absolute 0.6  0.1 - 1.0 K/uL   Eosinophils Relative 1  0 - 5 %   Eosinophils Absolute 0.1  0.0 - 0.7 K/uL   Basophils Relative 0  0 - 1 %   Basophils Absolute 0.0  0.0 - 0.1 K/uL  BASIC METABOLIC PANEL     Status: Abnormal   Collection Time    01/15/13  7:10 PM      Result Value Range   Sodium 139  135 - 145 mEq/L    Potassium 3.9  3.5 - 5.1 mEq/L   Chloride 101  96 - 112 mEq/L   CO2 30  19 - 32 mEq/L   Glucose, Bld 113 (*) 70 - 99 mg/dL   BUN 56 (*) 6 - 23 mg/dL   Creatinine, Ser 7.82 (*) 0.50 - 1.35 mg/dL   Calcium 8.8  8.4 - 95.6 mg/dL   GFR calc non Af Amer 24 (*) >90 mL/min   GFR calc Af Amer 28 (*) >90 mL/min   Comment:            The eGFR has been calculated     using the CKD EPI equation.     This calculation has not been     validated in all clinical     situations.     eGFR's persistently     <90 mL/min signify     possible Chronic Kidney Disease.  PROTIME-INR     Status: None   Collection Time    01/15/13  7:10 PM      Result Value Range   Prothrombin Time 13.6  11.6 - 15.2 seconds   INR 1.06  0.00 - 1.49  APTT     Status: None   Collection Time    01/15/13  7:10 PM      Result Value Range   aPTT 30  24 - 37 seconds  URINALYSIS, ROUTINE W REFLEX MICROSCOPIC     Status: None   Collection Time    01/15/13  7:25 PM      Result Value Range   Color, Urine YELLOW  YELLOW   APPearance CLEAR  CLEAR   Specific Gravity, Urine 1.015  1.005 - 1.030   pH 5.5  5.0 - 8.0   Glucose, UA NEGATIVE  NEGATIVE mg/dL   Hgb urine dipstick NEGATIVE  NEGATIVE   Bilirubin Urine NEGATIVE  NEGATIVE   Ketones, ur NEGATIVE  NEGATIVE mg/dL   Protein, ur  NEGATIVE  NEGATIVE mg/dL   Urobilinogen, UA 0.2  0.0 - 1.0 mg/dL   Nitrite NEGATIVE  NEGATIVE   Leukocytes, UA NEGATIVE  NEGATIVE   Comment: MICROSCOPIC NOT DONE ON URINES WITH NEGATIVE PROTEIN, BLOOD, LEUKOCYTES, NITRITE, OR GLUCOSE <1000 mg/dL.    Radiology Reports: Dg Chest 2 View  01/15/2013   *RADIOLOGY REPORT*  Clinical Data: Shortness of breath  CHEST - 2 VIEW  Comparison: 01/06/2013  Findings: Left-sided pacemaker noted.  Deformity of the right distal clavicle is stable.  Mild enlargement cardiomediastinal silhouette again noted without evidence for edema. Lung volumes are low with crowding of the bronchovascular markings.  Allowing for this,  no focal pulmonary opacity is identified.  No pleural effusion.  The upper chest is omitted from the lateral projection. No acute osseous abnormality.  IMPRESSION: Hypoaeration without focal acute finding.  Stable cardiomegaly without edema.   Original Report Authenticated By: Christiana Pellant, M.D.    Electrocardiogram: EKG shows paced rhythm at 61 beats per minute  Problem List  Active Problems:   Essential hypertension, benign   CAD   DIASTOLIC HEART FAILURE, CHRONIC   Pacemaker-Medtronic   Leg swelling   Dyspnea   Chest tightness   History of DVT (deep vein thrombosis)   CKD (chronic kidney disease) stage 3, GFR 30-59 ml/min   Acute on chronic renal failure   Assessment: This is 79 year old, Caucasian male, who presents with leg swelling, right greater than left for the past 2 days, dyspnea, and chest tightness. He was recently treated for pneumonia. Chest x-ray does not show any infiltrates at this time. On examination, he does have erythema of the right leg with swelling, which is more than the left. Differential diagnoses includes DVT, and possible PE. Acute coronary syndrome is less likely.  Plan: #1 dyspnea with chest tightness, and right leg swelling: Since he does have a history of DVT and since that is the most likely explanation he will be initiated on heparin. Due to his elevated creatinine a CT angiogram cannot be done at this time. We'll get a venous Doppler and VQ scan in the morning. We will get troponins.  #2 history of chronic kidney disease, stage III: His creatinine, and BUN are more elevated than his baseline. We'll give him gentle IV hydration for tonight.  #3 history of mild CAD and diastolic dysfunction: Appears to be compensated. EKG showing a paced rhythm. Continue with his aspirin for now.  #4 history of pacemaker placement for cardiac arrest in 2008: Continue to monitor on telemetry.  DVT Prophylaxis: On intravenous heparin Code Status: Full code Family  Communication: Discussed with the patient and his daughter  Disposition Plan: Admit to telemetry   Further management decisions will depend on results of further testing and patient's response to treatment.  [facility]  [facility] Pager [phone]  If 7PM-7AM, please contact night-coverage [email or web address] Password [facility]  01/15/2013, 10:11 PM

## 2013-01-15 NOTE — ED Notes (Signed)
Dr. Effie Shy in to see pt at this time

## 2013-01-16 ENCOUNTER — Encounter (HOSPITAL_COMMUNITY): Payer: Self-pay

## 2013-01-16 ENCOUNTER — Inpatient Hospital Stay (HOSPITAL_COMMUNITY): Payer: Medicare Other

## 2013-01-16 DIAGNOSIS — R0789 Other chest pain: Secondary | ICD-10-CM

## 2013-01-16 LAB — CBC
HCT: 29.2 % — ABNORMAL LOW (ref 39.0–52.0)
Hemoglobin: 10 g/dL — ABNORMAL LOW (ref 13.0–17.0)
MCHC: 34.2 g/dL (ref 30.0–36.0)
RDW: 14.3 % (ref 11.5–15.5)
WBC: 6.2 10*3/uL (ref 4.0–10.5)

## 2013-01-16 LAB — COMPREHENSIVE METABOLIC PANEL
BUN: 45 mg/dL — ABNORMAL HIGH (ref 6–23)
Calcium: 8.3 mg/dL — ABNORMAL LOW (ref 8.4–10.5)
Creatinine, Ser: 1.97 mg/dL — ABNORMAL HIGH (ref 0.50–1.35)
GFR calc Af Amer: 34 mL/min — ABNORMAL LOW (ref >90)
Glucose, Bld: 99 mg/dL (ref 70–99)
Total Protein: 6 g/dL (ref 6.0–8.3)

## 2013-01-16 LAB — HEPARIN LEVEL (UNFRACTIONATED): Heparin Unfractionated: 0.82 IU/mL — ABNORMAL HIGH (ref 0.30–0.70)

## 2013-01-16 MED ORDER — HEPARIN (PORCINE) IN NACL 100-0.45 UNIT/ML-% IJ SOLN
INTRAMUSCULAR | Status: AC
  Filled 2013-01-16: qty 250

## 2013-01-16 MED ORDER — POTASSIUM CHLORIDE CRYS ER 20 MEQ PO TBCR
40.0000 meq | EXTENDED_RELEASE_TABLET | Freq: Once | ORAL | Status: AC
  Administered 2013-01-16: 40 meq via ORAL
  Filled 2013-01-16: qty 2

## 2013-01-16 MED ORDER — TECHNETIUM TC 99M DIETHYLENETRIAME-PENTAACETIC ACID
40.0000 | Freq: Once | INTRAVENOUS | Status: AC | PRN
  Administered 2013-01-16: 40 via RESPIRATORY_TRACT

## 2013-01-16 MED ORDER — TECHNETIUM TO 99M ALBUMIN AGGREGATED
6.0000 | Freq: Once | INTRAVENOUS | Status: AC | PRN
  Administered 2013-01-16: 6 via INTRAVENOUS

## 2013-01-16 MED ORDER — OXYCODONE HCL 5 MG PO TABS
5.0000 mg | ORAL_TABLET | Freq: Four times a day (QID) | ORAL | Status: DC | PRN
  Administered 2013-01-17: 5 mg via ORAL
  Filled 2013-01-16: qty 1

## 2013-01-16 MED ORDER — FUROSEMIDE 10 MG/ML IJ SOLN
40.0000 mg | Freq: Two times a day (BID) | INTRAMUSCULAR | Status: DC
  Administered 2013-01-16: 40 mg via INTRAVENOUS
  Filled 2013-01-16 (×2): qty 4

## 2013-01-16 MED ORDER — HEPARIN SODIUM (PORCINE) 5000 UNIT/ML IJ SOLN
5000.0000 [IU] | Freq: Three times a day (TID) | INTRAMUSCULAR | Status: DC
  Administered 2013-01-16 – 2013-01-17 (×2): 5000 [IU] via SUBCUTANEOUS
  Filled 2013-01-16 (×2): qty 1

## 2013-01-16 NOTE — Progress Notes (Signed)
[facility] PROGRESS NOTE  [name] [name] WUJ:[id number] DOB: 02-May-1924 DOA: 01/15/2013 PCP: [name], MD  Assessment/Plan: 1. Pedal edema.  Possibly related to CHF.  Will start the patient on lasix.  Venous dopplers ruled out for DVT.  V/Q scan low prob for PE.  Will continue with lasix and elevate legs. Discontinue Heparin. Monitor strict Is and Os. 2. Acute on chronic renal failure.  Creatinine has mildly improved with lasix, but he appears to be grossly volume overloaded.  Will discontinue IV fluids and start intravenous lasix. 3. Dyspnea with chest tightness.  Cardiac enzymes are thus far negative. May be related to an element of volume overload.  Chest xray does not show pneumonia.  Will continue with current treatments. 4. Hypokalemia, replace  Code Status: full code Family Communication: discussed with patient at the bedside Disposition Plan: discharge home once improved   Consultants:  none  Procedures:  none  Antibiotics:  none  HPI/Subjective: Feeling better, still has some shortness of breath and leg edema  Objective: Filed Vitals:   01/15/13 2202 01/16/13 0514 01/16/13 1034 01/16/13 1400  BP: 116/61 101/59 108/68 96/60  Pulse: 65 63  63  Temp: 98 F (36.7 C) 97.9 F (36.6 C)  98.1 F (36.7 C)  TempSrc: Oral Oral  Oral  Resp: 20 20  20   Height:      Weight: 90.266 kg (199 lb)     SpO2: 100% 92%  98%    Intake/Output Summary (Last 24 hours) at 01/16/13 1621 Last data filed at 01/16/13 1200  Gross per 24 hour  Intake 886.25 ml  Output   1000 ml  Net -113.75 ml   Filed Weights   01/15/13 1813 01/15/13 2202  Weight: 86.183 kg (190 lb) 90.266 kg (199 lb)    Exam:   General:  NAD  Cardiovascular: s1, s2 rrr  Respiratory: cta b  Abdomen: soft, nt, nd, bs+  Musculoskeletal: 2+ edema in LE bilaterally up to thighs, right LE is slightly more red and swollen than left, bruising noted on lateral aspect of right leg near ankle,  FROM, no pain on movement   Data Reviewed: Basic Metabolic Panel:  Recent Labs Lab 01/15/13 1910 01/16/13 0432  NA 139 141  K 3.9 3.4*  CL 101 104  CO2 30 28  GLUCOSE 113* 99  BUN 56* 45*  CREATININE 2.27* 1.97*  CALCIUM 8.8 8.3*   Liver Function Tests:  Recent Labs Lab 01/16/13 0432  AST 14  ALT 6  ALKPHOS 65  BILITOT 0.6  PROT 6.0  ALBUMIN 2.9*   No results found for this basename: LIPASE, AMYLASE,  in the last 168 hours No results found for this basename: AMMONIA,  in the last 168 hours CBC:  Recent Labs Lab 01/15/13 1910 01/16/13 0432  WBC 7.1 6.2  NEUTROABS 5.2  --   HGB 10.5* 10.0*  HCT 30.8* 29.2*  MCV 101.7* 101.4*  PLT 133* 129*   Cardiac Enzymes:  Recent Labs Lab 01/15/13 2222 01/16/13 0432  TROPONINI <0.30 <0.30   BNP (last 3 results)  Recent Labs  01/06/13 0846  PROBNP 575.2*   CBG: No results found for this basename: GLUCAP,  in the last 168 hours  No results found for this or any previous visit (from the past 240 hour(s)).   Studies: Dg Chest 2 View  01/15/2013   *RADIOLOGY REPORT*  Clinical Data: Shortness of breath  CHEST - 2 VIEW  Comparison: 01/06/2013  Findings: Left-sided pacemaker noted.  Deformity of the right distal clavicle is stable.  Mild enlargement cardiomediastinal silhouette again noted without evidence for edema. Lung volumes are low with crowding of the bronchovascular markings.  Allowing for this, no focal pulmonary opacity is identified.  No pleural effusion.  The upper chest is omitted from the lateral projection. No acute osseous abnormality.  IMPRESSION: Hypoaeration without focal acute finding.  Stable cardiomegaly without edema.   Original Report Authenticated By: Christiana Pellant, M.D.   Nm Pulmonary Perf And Vent  01/16/2013   *RADIOLOGY REPORT*  Clinical Data:  Short of breath and cough, chest tightness  NUCLEAR MEDICINE VENTILATION - PERFUSION LUNG SCAN  Technique:  Ventilation images were obtained in  multiple projections using inhaled aerosol technetium 99 M DTPA.  Perfusion images were obtained in multiple projections after intravenous injection of Tc-48m MAA.  Radiopharmaceuticals:  Tc-63m DTPA aerosol and six mCi Tc-28m MAA.  Comparison: Chest radiograph 01/15/2013  Findings:  Ventilation:   No focal ventilation defect.  Perfusion:   No wedge shaped peripheral perfusion defects to suggest acute pulmonary embolism.  There is some heterogeneity perfusion the upper lobes which may relate to the chronic obstructive pulmonary disease.  IMPRESSION: Very low probability for acute pulmonary embolism.   Original Report Authenticated By: Genevive Bi, M.D.   US Venous Img Lower Bilateral  01/16/2013   *RADIOLOGY REPORT*  Clinical Data: Bilateral leg swelling  VENOUS DUPLEX ULTRASOUND OF BILATERAL LOWER EXTREMITIES  Technique:  Gray-scale sonography with graded compression, as well as color Doppler and duplex ultrasound, were performed to evaluate the deep venous system of both lower extremities from the level of the common femoral vein through the popliteal and proximal calf veins.  Spectral Doppler was utilized to evaluate flow at rest and with distal augmentation maneuvers.  Comparison:  None.  Findings: The visualized bilateral lower extremity deep venous systems appear patent.  Normal compressibility.  Patent color Doppler flow.  Satisfactory spectral Doppler with respiratory variation and response to augmentation.  The greater saphenous vein, where visualized, is patent and compressible bilaterally.  Subcutaneous edema in the calf bilaterally, right greater than left.  IMPRESSION: No deep venous thrombosis in the visualized bilateral lower extremities.  Subcutaneous edema in the calf, right greater than left.   Original Report Authenticated By: Charline Bills, M.D.    Scheduled Meds: . allopurinol  300 mg Oral Daily  . aspirin  81 mg Oral q morning - 10a  . atorvastatin  40 mg Oral QHS  .  finasteride  5 mg Oral Daily  . [START ON 01/17/2013] furosemide  40 mg Oral Daily  . heparin      . metoprolol  25 mg Oral BID  . pantoprazole  40 mg Oral BID  . sertraline  100 mg Oral q morning - 10a  . sodium chloride  3 mL Intravenous Q12H   Continuous Infusions:   Active Problems:   Essential hypertension, benign   CAD   DIASTOLIC HEART FAILURE, CHRONIC   Pacemaker-Medtronic   Leg swelling   Dyspnea   Chest tightness   History of DVT (deep vein thrombosis)   CKD (chronic kidney disease) stage 3, GFR 30-59 ml/min   Acute on chronic renal failure    Time spent:    [facility]  [facility] Pager [phone]. If 7PM-7AM, please contact night-coverage at [email or web address], password [facility] 01/16/2013, 4:21 PM  LOS: 1 day

## 2013-01-16 NOTE — Plan of Care (Signed)
Problem: Phase I Progression Outcomes Goal: Voiding-avoid urinary catheter unless indicated Indicated to pt the importance of keeping track of I/O and why

## 2013-01-16 NOTE — Progress Notes (Signed)
ANTICOAGULATION CONSULT NOTE - Follow Up Consult  Pharmacy Consult for Heparin Indication: pulmonary embolus  No Known Allergies  Patient Measurements: Height: 5\' 8"  (172.7 cm) Weight: 199 lb (90.266 kg) IBW/kg (Calculated) : 68.4  Vital Signs: Temp: 97.9 F (36.6 C) (07/19 0514) Temp src: Oral (07/19 0514) BP: 101/59 mmHg (07/19 0514) Pulse Rate: 63 (07/19 0514)  Labs:  Recent Labs  01/15/13 1910 01/15/13 2222 01/16/13 0432  HGB 10.5*  --  10.0*  HCT 30.8*  --  29.2*  PLT 133*  --  129*  APTT 30  --   --   LABPROT 13.6  --   --   INR 1.06  --   --   HEPARINUNFRC  --   --  0.82*  CREATININE 2.27*  --  1.97*  TROPONINI  --  <0.30 <0.30   Estimated Creatinine Clearance: 29.4 ml/min (by C-G formula based on Cr of 1.97).  Medications:  Scheduled:  . allopurinol  300 mg Oral Daily  . aspirin  81 mg Oral q morning - 10a  . atorvastatin  40 mg Oral QHS  . finasteride  5 mg Oral Daily  . [START ON 01/17/2013] furosemide  40 mg Oral Daily  . metoprolol  25 mg Oral BID  . pantoprazole  40 mg Oral BID  . sertraline  100 mg Oral q morning - 10a  . sodium chloride  3 mL Intravenous Q12H   Assessment: 79yo male started on IV Heparin last night for PE.  H/H is stable.  PLTs OK.  Pt has h/o DVT in 2004.  Heparin level this am is slightly above goal range.    Goal of Therapy:  Heparin level 0.3-0.7 units/ml Monitor platelets by anticoagulation protocol: Yes   Plan:  Taper Heparin to 1200 units/hr  Check Heparin level in 8 hrs CBC daily while on Heparin Heparin level daily  Margo Aye, Carthel Castille A 01/16/2013,8:32 AM

## 2013-01-16 NOTE — Plan of Care (Signed)
New Heparin bag pulled through override (per pharm suggestions) since could find in pt pyxis record.  Pt was dry and needed new bag.  Once I brought to room and pulled up pt record - found that heparin had been DCd.  Pt SL and heparin was not hung.

## 2013-01-17 DIAGNOSIS — I5032 Chronic diastolic (congestive) heart failure: Secondary | ICD-10-CM

## 2013-01-17 DIAGNOSIS — I10 Essential (primary) hypertension: Secondary | ICD-10-CM

## 2013-01-17 LAB — BASIC METABOLIC PANEL
BUN: 35 mg/dL — ABNORMAL HIGH (ref 6–23)
Chloride: 107 mEq/L (ref 96–112)
GFR calc Af Amer: 41 mL/min — ABNORMAL LOW (ref >90)
Glucose, Bld: 102 mg/dL — ABNORMAL HIGH (ref 70–99)
Potassium: 4.4 mEq/L (ref 3.5–5.1)

## 2013-01-17 LAB — URINE CULTURE
Colony Count: NO GROWTH
Culture: NO GROWTH

## 2013-01-17 LAB — CBC
HCT: 30 % — ABNORMAL LOW (ref 39.0–52.0)
Hemoglobin: 10.2 g/dL — ABNORMAL LOW (ref 13.0–17.0)
WBC: 5.2 10*3/uL (ref 4.0–10.5)

## 2013-01-17 MED ORDER — FUROSEMIDE 40 MG PO TABS: ORAL_TABLET | ORAL | Status: DC

## 2013-01-17 NOTE — Plan of Care (Signed)
Problem: Discharge Progression Outcomes Goal: Activity appropriate for discharge plan Outcome: Completed/Met Date Met:  01/17/13 01/17/13 1536 patient ambulated in hallway independently with nurse tech supervision this afternoon. Ambulated about 200 feet with no complaints. Notified MD. Earnstine Regal, RN

## 2013-01-17 NOTE — Progress Notes (Signed)
01/17/13 1542 Late entry for 1430. Patient lost IV access this morning due to site leaking. Pt requested no further IV sticks if possible. Text-paged Dr Kerry Hough to notify this morning. Discussed with Dr. Kerry Hough on rounds and notified morning dose of lasix not given d/t loss of IV access. Stated okay, patient to be discharged home. Earnstine Regal, RN

## 2013-01-17 NOTE — Plan of Care (Signed)
Problem: Phase II Progression Outcomes Goal: Progress activity as tolerated unless otherwise ordered Outcome: Completed/Met Date Met:  01/17/13 01/17/13 1158 patient up to chair and ambulates in room with supervision. Instructed to call for assist for safety. Tolerates sitting up well with no complaints. Earnstine Regal, RN

## 2013-01-17 NOTE — Discharge Summary (Signed)
Physician Discharge Summary  [name] [name] HYQ:[id number] DOB: [birth year] DOA: 01/15/2013  PCP: [name], MD  Admit date: 01/15/2013 Discharge date: 01/17/2013  Time spent: 45 minutes  Recommendations for Outpatient Follow-up:  1. Follow up with Cardiology, Dr. Jens Som in 1-2 weeks 2. Follow up with primary care doctor in 2 weeks 3. Losartan was discontinued due to renal dysfunction.  This may be resumed in the outpatient setting as felt appropriate by cardiology/nephrology  Discharge Diagnoses:  Active Problems:   Essential hypertension, benign   CAD   DIASTOLIC HEART FAILURE, acute on chronic   Pacemaker-Medtronic   Leg swelling   Dyspnea   Chest tightness   History of DVT (deep vein thrombosis)   CKD (chronic kidney disease) stage 3, GFR 30-59 ml/min   Acute on chronic renal failure   Discharge Condition: improved  Diet recommendation: low salt  Filed Weights   01/15/13 1813 01/15/13 2202  Weight: 86.183 kg (190 lb) 90.266 kg (199 lb)    History of present illness:  [name] [name] is a 79 y.o. male with a past medical history of DVT in 2004, pacemaker, chronic kidney disease stage III, mild coronary artery disease, diastolic dysfunction, who was in his usual state of health till about a week and a half ago, when he started having cough with yellowish expectoration. He was seen in the emergency department and was prescribed antibiotics for presumed pneumonia. He was having chest tightness, as well at that time. Troponin was normal. He started improving. However, in the last 2 days he's noticed that his right leg is more swollen than the left. He has been more sedentary in the last couple of weeks. His breathing has also not been as good in the last couple days. The chest tightness has been present for the last week and a half however, is still persisting. It is 5/10 in intensity and located in central chest. Denies any fever or chills. No nausea, vomiting, no  dizziness or lightheadedness. The chest tightness does not increase with deep breathing or cough. He has some cough, but it is dry. He took extra dose of Lasix yesterday because of his difficulty breathing. He has been weak and tired lately. He denies any falls. Denies any focal weakness.   Hospital Course:  This patient was admitted to the hospital with leg edema as well as some mild shortness of breath. Initially there was concern that the patient has underlying DVT. He was started empirically on anticoagulation. Lower extremity Dopplers were negative for DVT and VQ scan was low probability for PE. Anticoagulation was subsequently discontinued. He did have a mildly elevated BNP. On further questioning, he did report that he was compliant with his Lasix daily, but may not be compliant with his fluid/salt intake. His lower extremity edema and mild shortness of breath were likely related to volume overload from acute on chronic diastolic congestive heart failure. He was given IV Lasix with significant improvement in his symptoms. He does still have 1+ edema in his lower extremities bilaterally, but shortness of breath has improved and he is ambulating comfortably on room air. At this time we will increase his maintenance dose of Lasix from 40 mg daily to 80 mg twice a day for 3 days and then subsequently 80 mg daily. He is requested to followup with his primary cardiologist in the next one to 2 weeks. Echocardiogram services were not available here over the weekend and since the patient did not have any evidence of ischemia,  it was felt that repeat echocardiogram can be pursued in the outpatient setting. His last echocardiogram was 05/2012 where EF was found to be between 50-55%. The patient did have acute on chronic renal failure on admission. Creatinine was 2.2 on admission. The patient did receive IV Lasix and this improved his creatinine to 1.6. This is close to his baseline. We have discontinued losartan in  light of his renal dysfunction. This can be resumed in the outpatient setting as felt appropriate by his primary cardiologist/nephrologist.  Procedures: None  Consultations:  none  Discharge Exam: Filed Vitals:   01/16/13 1400 01/16/13 1933 01/16/13 2139 01/17/13 0538  BP: 96/60  113/63 134/50  Pulse: 63  63 61  Temp: 98.1 F (36.7 C)  98.2 F (36.8 C) 97.3 F (36.3 C)  TempSrc: Oral   Oral  Resp: 20  20 20   Height:      Weight:      SpO2: 98% 97% 97% 100%    General: NAD Cardiovascular: S1, S2 RRR Respiratory: CTA B 1+ edema b/l  Discharge Instructions  Discharge Orders   Future Appointments Provider Department Dept Phone   02/15/2013 12:35 PM [facility] [place]) [phone]   Future Orders Complete By Expires     (HEART FAILURE PATIENTS) Call MD:  Anytime you have any of the following symptoms: 1) 3 pound weight gain in 24 hours or 5 pounds in 1 week 2) shortness of breath, with or without a dry hacking cough 3) swelling in the hands, feet or stomach 4) if you have to sleep on extra pillows at night in order to breathe.  As directed     Diet - low sodium heart healthy  As directed     Face-to-face encounter (required for Medicare/Medicaid patients)  As directed     Comments:      I MEMON,JEHANZEB certify that this patient is under my care and that I, or a nurse practitioner or physician's assistant working with me, had a face-to-face encounter that meets the physician face-to-face encounter requirements with this patient on 01/17/2013. The encounter with the patient was in whole, or in part for the following medical condition(s) which is the primary reason for home health care (List medical condition): Patient admitted with CHF exacerbation. He would benefit from home health RN for disease management.    Questions:      The encounter with the patient was in whole, or in part, for the following medical condition, which is the  primary reason for home health care:  chf exacerbation    I certify that, based on my findings, the following services are medically necessary home health services:  Nursing    My clinical findings support the need for the above services:  Shortness of breath with activity    Further, I certify that my clinical findings support that this patient is homebound due to:  Shortness of Breath with activity    Reason for Medically Necessary Home Health Services:  Skilled Nursing- Skilled Assessment/Observation    Home Health  As directed     Questions:      To provide the following care/treatments:  RN    Increase activity slowly  As directed         Medication List    STOP taking these medications       guaiFENesin 600 MG 12 hr tablet  Commonly known as:  MUCINEX     levofloxacin 750 MG tablet  Commonly known  as:  LEVAQUIN     losartan 100 MG tablet  Commonly known as:  COZAAR     predniSONE 20 MG tablet  Commonly known as:  DELTASONE      TAKE these medications       albuterol 108 (90 BASE) MCG/ACT inhaler  Commonly known as:  PROVENTIL HFA;VENTOLIN HFA  Inhale 1-2 puffs into the lungs every 6 (six) hours as needed for wheezing.     allopurinol 300 MG tablet  Commonly known as:  ZYLOPRIM  Take 300 mg by mouth daily.     aspirin 81 MG tablet  Take 81 mg by mouth every morning.     atorvastatin 80 MG tablet  Commonly known as:  LIPITOR  Take 40 mg by mouth at bedtime.     finasteride 5 MG tablet  Commonly known as:  PROSCAR  Take 5 mg by mouth daily.     furosemide 40 MG tablet  Commonly known as:  LASIX  Take 80mg  po bid for 3 days, then 80mg  po daily     metoprolol 50 MG tablet  Commonly known as:  LOPRESSOR  Take 25 mg by mouth 2 (two) times daily.     OCUVITE EXTRA Tabs  Take 2 tablets by mouth every morning.     pantoprazole 40 MG tablet  Commonly known as:  PROTONIX  Take 40 mg by mouth 2 (two) times daily.     polyethylene glycol powder powder   Commonly known as:  GLYCOLAX/MIRALAX  Take 17 g by mouth daily as needed (for constipation).     sertraline 100 MG tablet  Commonly known as:  ZOLOFT  Take 100 mg by mouth every morning.       No Known Allergies     Follow-up Information   Follow up with [name], MD. Schedule an appointment as soon as possible for a visit in 2 weeks.   Contact information:   [address] [facility] [place] [address] [phone]       Follow up with Olga Millers, MD. Schedule an appointment as soon as possible for a visit in 1 week.   Contact information:   [address] [place], [address]                         [address] [place] [address] [phone]        The results of significant diagnostics from this hospitalization (including imaging, microbiology, ancillary and laboratory) are listed below for reference.    Significant Diagnostic Studies: Dg Chest 2 View  01/15/2013   *RADIOLOGY REPORT*  Clinical Data: Shortness of breath  CHEST - 2 VIEW  Comparison: 01/06/2013  Findings: Left-sided pacemaker noted.  Deformity of the right distal clavicle is stable.  Mild enlargement cardiomediastinal silhouette again noted without evidence for edema. Lung volumes are low with crowding of the bronchovascular markings.  Allowing for this, no focal pulmonary opacity is identified.  No pleural effusion.  The upper chest is omitted from the lateral projection. No acute osseous abnormality.  IMPRESSION: Hypoaeration without focal acute finding.  Stable cardiomegaly without edema.   Original Report Authenticated By: Christiana Pellant, M.D.   Dg Chest 2 View  01/06/2013   *RADIOLOGY REPORT*  Clinical Data: Cough and congestion.  CHEST - 2 VIEW  Comparison: 08/14/2011.  Findings: Trachea is midline.  Heart size stable.  Left subclavian pacemaker lead tips project over the right atrium and right ventricle.  Lungs are somewhat low in volume with streaky densities at the lung bases,  left greater than right.  Right hemidiaphragm is elevated, as before.  No pleural fluid.  Degenerative changes are seen in the spine.  IMPRESSION: Streaky densities at the lung bases can be seen with atelectasis. Difficult to exclude early bronchopneumonia.   Original Report Authenticated By: Leanna Battles, M.D.   Nm Pulmonary Perf And Vent  01/16/2013   *RADIOLOGY REPORT*  Clinical Data:  Short of breath and cough, chest tightness  NUCLEAR MEDICINE VENTILATION - PERFUSION LUNG SCAN  Technique:  Ventilation images were obtained in multiple projections using inhaled aerosol technetium 99 M DTPA.  Perfusion images were obtained in multiple projections after intravenous injection of Tc-5m MAA.  Radiopharmaceuticals:  Tc-78m DTPA aerosol and six mCi Tc-79m MAA.  Comparison: Chest radiograph 01/15/2013  Findings:  Ventilation:   No focal ventilation defect.  Perfusion:   No wedge shaped peripheral perfusion defects to suggest acute pulmonary embolism.  There is some heterogeneity perfusion the upper lobes which may relate to the chronic obstructive pulmonary disease.  IMPRESSION: Very low probability for acute pulmonary embolism.   Original Report Authenticated By: Genevive Bi, M.D.   US Venous Img Lower Bilateral  01/16/2013   *RADIOLOGY REPORT*  Clinical Data: Bilateral leg swelling  VENOUS DUPLEX ULTRASOUND OF BILATERAL LOWER EXTREMITIES  Technique:  Gray-scale sonography with graded compression, as well as color Doppler and duplex ultrasound, were performed to evaluate the deep venous system of both lower extremities from the level of the common femoral vein through the popliteal and proximal calf veins.  Spectral Doppler was utilized to evaluate flow at rest and with distal augmentation maneuvers.  Comparison:  None.  Findings: The visualized bilateral lower extremity deep venous systems appear patent.  Normal compressibility.  Patent color Doppler flow.  Satisfactory spectral Doppler with  respiratory variation and response to augmentation.  The greater saphenous vein, where visualized, is patent and compressible bilaterally.  Subcutaneous edema in the calf bilaterally, right greater than left.  IMPRESSION: No deep venous thrombosis in the visualized bilateral lower extremities.  Subcutaneous edema in the calf, right greater than left.   Original Report Authenticated By: Charline Bills, M.D.    Microbiology: No results found for this or any previous visit (from the past 240 hour(s)).   Labs: Basic Metabolic Panel:  Recent Labs Lab 01/15/13 1910 01/16/13 0432 01/17/13 0708  NA 139 141 142  K 3.9 3.4* 4.4  CL 101 104 107  CO2 30 28 32  GLUCOSE 113* 99 102*  BUN 56* 45* 35*  CREATININE 2.27* 1.97* 1.69*  CALCIUM 8.8 8.3* 8.6   Liver Function Tests:  Recent Labs Lab 01/16/13 0432  AST 14  ALT 6  ALKPHOS 65  BILITOT 0.6  PROT 6.0  ALBUMIN 2.9*   No results found for this basename: LIPASE, AMYLASE,  in the last 168 hours No results found for this basename: AMMONIA,  in the last 168 hours CBC:  Recent Labs Lab 01/15/13 1910 01/16/13 0432 01/17/13 0708  WBC 7.1 6.2 5.2  NEUTROABS 5.2  --   --   HGB 10.5* 10.0* 10.2*  HCT 30.8* 29.2* 30.0*  MCV 101.7* 101.4* 101.7*  PLT 133* 129* 124*   Cardiac Enzymes:  Recent Labs Lab 01/15/13 2222 01/16/13 0432  TROPONINI <0.30 <0.30   BNP: BNP (last 3 results)  Recent Labs  01/06/13 0846  PROBNP 575.2*   CBG: No results found for this basename: GLUCAP,  in the last 168 hours     Signed:  MEMON,JEHANZEB  [facility] 01/17/2013, 2:01 PM

## 2013-01-17 NOTE — Progress Notes (Signed)
01/17/13 1537 Reviewed discharge instructions with patient, daughter at bedside. Given copy of instructions, medication list, prescription, f/u appointment information. Home health set up with [facility] for home health RN. Pt agreeable to home health for discharge. Spoke with Britta Mccreedy at [facility] regarding referral, d/c summary, order, and facesheet faxed to [facility] as requested. Discussed heart failure education, daily weights, low sodium diet, when to call MD with patient and daughter. Pt states has scale at home and will start daily weights as instructed. Verbalized understanding of instructions. Notified CMT of telemetry d/c for discharge prior to removal. Pt denied pain or other discomfort prior to discharge. Pt left floor in stable condition via w/c accompanied by daughter and nurse. Earnstine Regal, RN

## 2013-02-03 ENCOUNTER — Encounter: Payer: Self-pay | Admitting: Physician Assistant

## 2013-02-03 ENCOUNTER — Other Ambulatory Visit: Payer: Self-pay | Admitting: Cardiology

## 2013-02-03 ENCOUNTER — Other Ambulatory Visit: Payer: Self-pay

## 2013-02-03 ENCOUNTER — Ambulatory Visit (INDEPENDENT_AMBULATORY_CARE_PROVIDER_SITE_OTHER): Payer: Medicare Other | Admitting: Physician Assistant

## 2013-02-03 VITALS — BP 119/60 | HR 79 | Ht 68.0 in | Wt 193.0 lb

## 2013-02-03 DIAGNOSIS — I2510 Atherosclerotic heart disease of native coronary artery without angina pectoris: Secondary | ICD-10-CM

## 2013-02-03 DIAGNOSIS — N179 Acute kidney failure, unspecified: Secondary | ICD-10-CM

## 2013-02-03 DIAGNOSIS — N189 Chronic kidney disease, unspecified: Secondary | ICD-10-CM

## 2013-02-03 DIAGNOSIS — R0609 Other forms of dyspnea: Secondary | ICD-10-CM

## 2013-02-03 DIAGNOSIS — R0600 Dyspnea, unspecified: Secondary | ICD-10-CM

## 2013-02-03 DIAGNOSIS — R0989 Other specified symptoms and signs involving the circulatory and respiratory systems: Secondary | ICD-10-CM

## 2013-02-03 DIAGNOSIS — I5032 Chronic diastolic (congestive) heart failure: Secondary | ICD-10-CM

## 2013-02-03 LAB — BASIC METABOLIC PANEL
BUN: 36 mg/dL — ABNORMAL HIGH (ref 6–23)
CO2: 31 mEq/L (ref 19–32)
Chloride: 103 mEq/L (ref 96–112)
Creatinine, Ser: 1.9 mg/dL — ABNORMAL HIGH (ref 0.4–1.5)
Potassium: 4.4 mEq/L (ref 3.5–5.1)

## 2013-02-03 MED ORDER — FUROSEMIDE 40 MG PO TABS: ORAL_TABLET | ORAL | Status: DC

## 2013-02-03 NOTE — Patient Instructions (Addendum)
Your physician has recommended you make the following change in your medication:   1. Decrease Lasix to 40 mg daily.  Your physician recommends that you have lab work today: BMET  Your physician recommends that you schedule a follow-up appointment in: 1 month with Dr. Jens Som  Start 2 gram Sodium Diet:  2 Gram Low Sodium Diet A 2 gram sodium diet restricts the amount of sodium in the diet to no more than 2 g or 2000 mg daily. Limiting the amount of sodium is often used to help lower blood pressure. It is important if you have heart, liver, or kidney problems. Many foods contain sodium for flavor and sometimes as a preservative. When the amount of sodium in a diet needs to be low, it is important to know what to look for when choosing foods and drinks. The following includes some information and guidelines to help make it easier for you to adapt to a low sodium diet. QUICK TIPS  Do not add salt to food.  Avoid convenience items and fast food.  Choose unsalted snack foods.  Buy lower sodium products, often labeled as "lower sodium" or "no salt added."  Check food labels to learn how much sodium is in 1 serving.  When eating at a restaurant, ask that your food be prepared with less salt or none, if possible. READING FOOD LABELS FOR SODIUM INFORMATION The nutrition facts label is a good place to find how much sodium is in foods. Look for products with no more than 500 to 600 mg of sodium per meal and no more than 150 mg per serving. Remember that 2 g = 2000 mg. The food label may also list foods as:  Sodium-free: Less than 5 mg in a serving.  Very low sodium: 35 mg or less in a serving.  Low-sodium: 140 mg or less in a serving.  Light in sodium: 50% less sodium in a serving. For example, if a food that usually has 300 mg of sodium is changed to become light in sodium, it will have 150 mg of sodium.  Reduced sodium: 25% less sodium in a serving. For example, if a food that usually  has 400 mg of sodium is changed to reduced sodium, it will have 300 mg of sodium. CHOOSING FOODS Grains  Avoid: Salted crackers and snack items. Some cereals, including instant hot cereals. Bread stuffing and biscuit mixes. Seasoned rice or pasta mixes.  Choose: Unsalted snack items. Low-sodium cereals, oats, puffed wheat and rice, shredded wheat. English muffins and bread. Pasta. Meats  Avoid: Salted, canned, smoked, spiced, pickled meats, including fish and poultry. Bacon, ham, sausage, cold cuts, hot dogs, anchovies.  Choose: Low-sodium canned tuna and salmon. Fresh or frozen meat, poultry, and fish. Dairy  Avoid: Processed cheese and spreads. Cottage cheese. Buttermilk and condensed milk. Regular cheese.  Choose: Milk. Low-sodium cottage cheese. Yogurt. Sour cream. Low-sodium cheese. Fruits and Vegetables  Avoid: Regular canned vegetables. Regular canned tomato sauce and paste. Frozen vegetables in sauces. Olives. Rosita Fire. Relishes. Sauerkraut.  Choose: Low-sodium canned vegetables. Low-sodium tomato sauce and paste. Frozen or fresh vegetables. Fresh and frozen fruit. Condiments  Avoid: Canned and packaged gravies. Worcestershire sauce. Tartar sauce. Barbecue sauce. Soy sauce. Steak sauce. Ketchup. Onion, garlic, and table salt. Meat flavorings and tenderizers.  Choose: Fresh and dried herbs and spices. Low-sodium varieties of mustard and ketchup. Lemon juice. Tabasco sauce. Horseradish. SAMPLE 2 GRAM SODIUM MEAL PLAN Breakfast / Sodium (mg)  1 cup low-fat milk / 143  mg  2 slices whole-wheat toast / 270 mg  1 tbs heart-healthy margarine / 153 mg  1 hard-boiled egg / 139 mg  1 small orange / 0 mg Lunch / Sodium (mg)  1 cup raw carrots / 76 mg   cup hummus / 298 mg  1 cup low-fat milk / 143 mg   cup red grapes / 2 mg  1 whole-wheat pita bread / 356 mg Dinner / Sodium (mg)  1 cup whole-wheat pasta / 2 mg  1 cup low-sodium tomato sauce / 73 mg  3 oz lean  ground beef / 57 mg  1 small side salad (1 cup raw spinach leaves,  cup cucumber,  cup yellow bell pepper) with 1 tsp olive oil and 1 tsp red wine vinegar / 25 mg Snack / Sodium (mg)  1 container low-fat vanilla yogurt / 107 mg  3 graham cracker squares / 127 mg Nutrient Analysis  Calories: 2033  Protein: 77 g  Carbohydrate: 282 g  Fat: 72 g  Sodium: 1971 mg Document Released: 06/17/2005 Document Revised: 09/09/2011 Document Reviewed: 09/18/2009 [employer] Patient Information 2014 [place], [place].

## 2013-02-03 NOTE — Assessment & Plan Note (Signed)
Patient's losartan was stopped in the hospital but he continued to take this after discharge. We will check labs today. I'm decreasing his Lasix to 40 mg daily.

## 2013-02-03 NOTE — Assessment & Plan Note (Signed)
Patient had recent hospitalization with heart failure which has resolved. We will decrease his Lasix to 40 mg daily and give him a 2 g sodium diet. I will check a bmet today. Follow up with Dr. Jens Som in one month. He is to call if he has any increase in shortness of breath or edema and we can adjust his Lasix.

## 2013-02-03 NOTE — Progress Notes (Signed)
HPI:  Mr. Fronczak is a very pleasant gentleman who has a history of congestive heart failure, felt secondary to diastolic dysfunction; hypertension; hyperlipidemia, mild coronary artery disease and cerebrovascular disease. He also has had a previous pacemaker placed in December 2008 following a cardiac arrest at [facility]. This was placed at [facility] and apparently was bradycardia mediated. His most recent cardiac catheterization here was in April 2005. At that time, he had nonobstructive coronary artery disease, but he did have elevated right and left filling pressures. Last Myoview was performed on February 05, 2007. Ejection fraction was 65% and the perfusion was normal. His most recent echocardiogram was performed in November 2013. His ejection fraction was 50-55%. There was mild aortic insufficiency. There was mild biatrial enlargement. Carotid Dopplers in sept 2013 showed 60-79% left and 40-59% right stenosis. Followup is recommended in six months. I last saw him in Feb 2013. Patient seen by Dr. Graciela Husbands in November of 2013. Diuretics were increased.   The patient was recently in-hospital for 3 days with CHF exacerbation. He had right lower extremity edema and initially thought he may have a DVT but Dopplers were negative. VQ scan was low probability. He was treated with increasing diuretics and his symptoms resolved. His Cozaar was stopped because of renal insufficiency but the patient continued to take this after discharge. He feels like his breathing is back to baseline and would like to cut back on his Lasix because he is worried about his kidneys. He denies any dyspnea at rest or significant edema. He has mild dyspnea on exertion that is chronic. He says his toes turn blue when he doesn't prop his feet up and he actually had some blisters peeling from the bottom of his feet after hospitalization.  No Known Allergies  Current Outpatient Prescriptions on File Prior to Visit: albuterol  (PROVENTIL HFA;VENTOLIN HFA) 108 (90 BASE) MCG/ACT inhaler, Inhale 1-2 puffs into the lungs every 6 (six) hours as needed for wheezing., Disp: 1 Inhaler, Rfl: 0 allopurinol (ZYLOPRIM) 300 MG tablet, Take 300 mg by mouth daily. , Disp: , Rfl:  aspirin 81 MG tablet, Take 81 mg by mouth every morning. , Disp: , Rfl:  atorvastatin (LIPITOR) 80 MG tablet, Take 40 mg by mouth at bedtime. , Disp: , Rfl:  finasteride (PROSCAR) 5 MG tablet, Take 5 mg by mouth daily. , Disp: , Rfl:   furosemide (LASIX) 40 MG tablet, Take 80mg  po bid for 3 days, then 80mg  po daily, Disp: 30 tablet, Rfl: 0 metoprolol (LOPRESSOR) 50 MG tablet, Take 25 mg by mouth 2 (two) times daily., Disp: , Rfl:  Multiple Vitamins-Minerals (OCUVITE EXTRA) TABS, Take 2 tablets by mouth every morning. , Disp: , Rfl:  pantoprazole (PROTONIX) 40 MG tablet, Take 40 mg by mouth 2 (two) times daily. , Disp: , Rfl:  polyethylene glycol powder (GLYCOLAX/MIRALAX) powder, Take 17 g by mouth daily as needed (for constipation)., Disp: , Rfl:  sertraline (ZOLOFT) 100 MG tablet, Take 100 mg by mouth every morning., Disp: , Rfl:   No current facility-administered medications on file prior to visit.   Past Medical History:   Osteoarthritis                                               Gout  Hypertension                                                 History of coronary artery disease                           GERD (gastroesophageal reflux disease)                       CHF (congestive heart failure)                               Diastolic dysfunction                                        History of GI bleed                                          History of pacemaker                                           Comment:Medtronic Adapta ADDro1   Peptic ulcer disease                                         BPH (benign prostatic hypertrophy)                           Macular degeneration, right eye                                 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   Hyperlipidemia                                               History of cerebrovascular disease                           Kidney stone                                                 Depression                                                  Past Surgical History:   PACEMAKER INSERTION  Comment:Medtronic Adapta ADDro1   CHOLECYSTECTOMY                                               TOTAL KNEE ARTHROPLASTY                                       HERNIA REPAIR                                                 TONSILLECTOMY                                                 TOTAL KNEE ARTHROPLASTY                          08/28/2011      Comment:Procedure: TOTAL KNEE ARTHROPLASTY;  Surgeon:               Drucilla Schmidt, MD;  Location: [facility];                Service: Orthopedics;  Laterality: Left;  Review of patient's family history indicates:   Diabetes                       Mother                   Leukemia                       Father                   Social History   Marital Status: Married             Spouse Name:                      Years of Education:                 Number of children:             Occupational History Occupation          Associate Professor            Comment              Retired                                   Social History Main Topics   Smoking Status: Former Smoker                   Packs/Day: 0.00  Years:         Smokeless Status: Former Neurosurgeon                        Quit date: 08/13/1976   Alcohol Use: No  Drug Use: No             Sexual Activity: No                 Other Topics            Concern   None on file  Social History Narrative   Married for 20 years   2 children and 3 grandchildren    ROS: See history of present illness otherwise negative   PHYSICAL EXAM: Well-nournished, in no acute distress. Neck: No JVD, HJR, Bruit, or thyroid  enlargement  Lungs: No tachypnea, clear without wheezing, rales, or rhonchi  Cardiovascular: RRR, PMI not displaced, 2/6 systolic murmur at the left sternal border, positive S4, no bruit, thrill, or heave.  Abdomen: BS normal. Soft without organomegaly, masses, lesions or tenderness.  Extremities: Trace of ankle edema, toes are cyanotic unless there are elevated. Good distal pulses bilateral.  SKin: Warm, no lesions or rashes   Musculoskeletal: No deformities  Neuro: no focal signs  BP 119/60  Pulse 79  Ht 5\' 8"  (1.727 m)  Wt 193 lb (87.544 kg)  BMI 29.35 kg/m2    EKG: Atrial paced rhythm, left bundle branch block

## 2013-02-08 ENCOUNTER — Telehealth: Payer: Self-pay | Admitting: Nurse Practitioner

## 2013-02-08 DIAGNOSIS — I5032 Chronic diastolic (congestive) heart failure: Secondary | ICD-10-CM

## 2013-02-08 NOTE — Telephone Encounter (Signed)
Called patient to review labs and scheduled patient to come for repeat BMET 8/13.  Patient verbalized understanding and states he has been taking the decreased Lasix dose as instructed at ov on 8/6.

## 2013-02-08 NOTE — Telephone Encounter (Signed)
Message copied by Levi Aland on Mon Feb 08, 2013  3:17 PM ------      Message from: Prescott Gum      Created: Wed Feb 03, 2013  2:48 PM       Lasix decreased today already. Have repeat bmet in 1 week to f/u renal function. ------

## 2013-02-10 ENCOUNTER — Other Ambulatory Visit (INDEPENDENT_AMBULATORY_CARE_PROVIDER_SITE_OTHER): Payer: Medicare Other

## 2013-02-10 DIAGNOSIS — I5032 Chronic diastolic (congestive) heart failure: Secondary | ICD-10-CM

## 2013-02-10 LAB — BASIC METABOLIC PANEL
BUN: 33 mg/dL — ABNORMAL HIGH (ref 6–23)
CO2: 30 mEq/L (ref 19–32)
GFR: 35.61 mL/min — ABNORMAL LOW (ref >60.00)
Glucose, Bld: 113 mg/dL — ABNORMAL HIGH (ref 70–99)
Potassium: 3.9 mEq/L (ref 3.5–5.1)

## 2013-02-15 ENCOUNTER — Ambulatory Visit (INDEPENDENT_AMBULATORY_CARE_PROVIDER_SITE_OTHER): Payer: Medicare Other | Admitting: *Deleted

## 2013-02-15 DIAGNOSIS — Z950 Presence of cardiac pacemaker: Secondary | ICD-10-CM

## 2013-02-15 DIAGNOSIS — I442 Atrioventricular block, complete: Secondary | ICD-10-CM

## 2013-02-26 ENCOUNTER — Ambulatory Visit (INDEPENDENT_AMBULATORY_CARE_PROVIDER_SITE_OTHER): Payer: Medicare Other | Admitting: Cardiology

## 2013-02-26 ENCOUNTER — Ambulatory Visit: Payer: Medicare Other | Admitting: Cardiology

## 2013-02-26 ENCOUNTER — Encounter: Payer: Self-pay | Admitting: Cardiology

## 2013-02-26 VITALS — BP 120/60 | HR 60 | Ht 68.5 in | Wt 193.8 lb

## 2013-02-26 DIAGNOSIS — I10 Essential (primary) hypertension: Secondary | ICD-10-CM

## 2013-02-26 DIAGNOSIS — I350 Nonrheumatic aortic (valve) stenosis: Secondary | ICD-10-CM

## 2013-02-26 DIAGNOSIS — I5032 Chronic diastolic (congestive) heart failure: Secondary | ICD-10-CM

## 2013-02-26 DIAGNOSIS — Z950 Presence of cardiac pacemaker: Secondary | ICD-10-CM

## 2013-02-26 DIAGNOSIS — I359 Nonrheumatic aortic valve disorder, unspecified: Secondary | ICD-10-CM

## 2013-02-26 DIAGNOSIS — E785 Hyperlipidemia, unspecified: Secondary | ICD-10-CM

## 2013-02-26 DIAGNOSIS — I2510 Atherosclerotic heart disease of native coronary artery without angina pectoris: Secondary | ICD-10-CM

## 2013-02-26 NOTE — Progress Notes (Signed)
HPI: [name] is a very pleasant gentleman who has a history of congestive heart failure, felt secondary to diastolic dysfunction; hypertension; hyperlipidemia, mild coronary artery disease and cerebrovascular disease. He also has had a previous pacemaker placed in December 2008 following a cardiac arrest at [facility]. This was placed at [facility] and apparently was bradycardia mediated. His most recent cardiac catheterization here was in April 2005. At that time, he had nonobstructive coronary artery disease, but he did have elevated right and left filling pressures. Last Myoview was performed on February 05, 2007. Ejection fraction was 65% and the perfusion was normal. His most recent echocardiogram was performed in November 2013. His ejection fraction was 50-55%. There was mild aortic insufficiency. There was mild biatrial enlargement. Carotid Dopplers in April 2014 showed 60-79% bilateral stenosis. Followup is recommended in six months. Patient hospitalized in July of 2014 CHF exacerbation. Since that time, he does have dyspnea on exertion which is chronic. Mild pedal edema. No orthopnea or PND. No chest pain or syncope.   Current Outpatient Prescriptions  Medication Sig Dispense Refill  . allopurinol (ZYLOPRIM) 300 MG tablet Take 300 mg by mouth daily.       Marland Kitchen aspirin 81 MG tablet Take 81 mg by mouth every morning.       Marland Kitchen atorvastatin (LIPITOR) 80 MG tablet Take 40 mg by mouth at bedtime.       . finasteride (PROSCAR) 5 MG tablet Take 5 mg by mouth daily.       . furosemide (LASIX) 40 MG tablet Take 40 mg by mouth daily.  30 tablet  3  . losartan (COZAAR) 100 MG tablet Take 100 mg by mouth daily.      . metoprolol (LOPRESSOR) 50 MG tablet Take 25 mg by mouth 2 (two) times daily.      . Multiple Vitamins-Minerals (OCUVITE EXTRA) TABS Take 2 tablets by mouth 2 (two) times daily.       . pantoprazole (PROTONIX) 40 MG tablet Take 40 mg by mouth 2 (two) times daily.       .  polyethylene glycol powder (GLYCOLAX/MIRALAX) powder Take 17 g by mouth daily as needed (for constipation).      . sertraline (ZOLOFT) 100 MG tablet Take 100 mg by mouth every morning.       No current facility-administered medications for this visit.     Past Medical History  Diagnosis Date  . Osteoarthritis   . Gout   . Hypertension   . History of coronary artery disease   . GERD (gastroesophageal reflux disease)   . CHF (congestive heart failure)   . Diastolic dysfunction   . History of GI bleed   . History of pacemaker     Medtronic Adapta ADDro1  . Peptic ulcer disease   . BPH (benign prostatic hypertrophy)   . 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 Hyperlipidemia   . History of cerebrovascular disease   . Kidney stone   . Depression     Past Surgical History  Procedure Laterality Date  . Pacemaker insertion      Medtronic Adapta ADDro1  . Cholecystectomy    . Total knee arthroplasty    . Hernia repair    . Tonsillectomy    . Total knee arthroplasty  08/28/2011    Procedure: TOTAL KNEE ARTHROPLASTY;  Surgeon: Drucilla Schmidt, MD;  Location: [facility];  Service: Orthopedics;  Laterality: Left;    History  Social History  . Marital Status: Married    Spouse Name: N/A    Number of Children: N/A  . Years of Education: N/A   Occupational History  . Retired    Social History Main Topics  . Smoking status: Former Games developer  . Smokeless tobacco: Former Neurosurgeon    Quit date: 08/13/1976  . Alcohol Use: No  . Drug Use: No  . Sexual Activity: No   Other Topics Concern  . Not on file   Social History Narrative   Married for 20 years   2 children and 3 grandchildren    ROS: no fevers or chills, productive cough, hemoptysis, dysphasia, odynophagia, melena, hematochezia, dysuria, hematuria, rash, seizure activity, orthopnea, PND, pedal edema, claudication. Remaining systems are negative.  Physical Exam: Well-developed well-nourished in no acute  distress.  Skin is warm and dry.  HEENT is normal.  Neck is supple.  Chest is clear to auscultation with normal expansion.  Cardiovascular exam is regular rate and rhythm. 2/6 systolic murmur Abdominal exam nontender or distended. No masses palpated. Extremities show chronic skin changes 1+ edema. neuro grossly intact

## 2013-02-26 NOTE — Assessment & Plan Note (Signed)
Followup carotid Dopplers October 2014. Continue aspirin and statin.

## 2013-02-26 NOTE — Assessment & Plan Note (Signed)
Continue present dose of Lasix. 

## 2013-02-26 NOTE — Assessment & Plan Note (Signed)
Continue statin. 

## 2013-02-26 NOTE — Assessment & Plan Note (Signed)
Management per electrophysiology. 

## 2013-02-26 NOTE — Assessment & Plan Note (Signed)
Continue present blood pressure medications. 

## 2013-02-26 NOTE — Assessment & Plan Note (Signed)
Continue aspirin and statin. 

## 2013-02-26 NOTE — Patient Instructions (Addendum)
Your physician wants you to follow-up in: 6 MONTHS WITH DR CRENSHAW You will receive a reminder letter in the mail two months in advance. If you don't receive a letter, please call our office to schedule the follow-up appointment.  

## 2013-02-26 NOTE — Assessment & Plan Note (Signed)
We'll most likely repeat echocardiogram in approximately one year.

## 2013-03-02 ENCOUNTER — Other Ambulatory Visit: Payer: Self-pay | Admitting: Cardiology

## 2013-03-04 LAB — REMOTE PACEMAKER DEVICE
AL THRESHOLD: 0.625 V
ATRIAL PACING PM: 87
RV LEAD THRESHOLD: 0.625 V

## 2013-03-24 ENCOUNTER — Encounter: Payer: Self-pay | Admitting: *Deleted

## 2013-03-26 ENCOUNTER — Emergency Department (HOSPITAL_COMMUNITY)
Admission: EM | Admit: 2013-03-26 | Discharge: 2013-03-26 | Disposition: A | Discharge status: Home / Self Care | Payer: Medicare Other | Attending: Emergency Medicine | Admitting: Emergency Medicine

## 2013-03-26 ENCOUNTER — Encounter (HOSPITAL_COMMUNITY): Payer: Self-pay | Admitting: *Deleted

## 2013-03-26 ENCOUNTER — Emergency Department (HOSPITAL_COMMUNITY): Payer: Medicare Other

## 2013-03-26 DIAGNOSIS — N400 Enlarged prostate without lower urinary tract symptoms: Secondary | ICD-10-CM | POA: Insufficient documentation

## 2013-03-26 DIAGNOSIS — I509 Heart failure, unspecified: Secondary | ICD-10-CM | POA: Insufficient documentation

## 2013-03-26 DIAGNOSIS — I10 Essential (primary) hypertension: Secondary | ICD-10-CM | POA: Insufficient documentation

## 2013-03-26 DIAGNOSIS — I2510 Atherosclerotic heart disease of native coronary artery without angina pectoris: Secondary | ICD-10-CM | POA: Insufficient documentation

## 2013-03-26 DIAGNOSIS — Y9301 Activity, walking, marching and hiking: Secondary | ICD-10-CM | POA: Insufficient documentation

## 2013-03-26 DIAGNOSIS — Z950 Presence of cardiac pacemaker: Secondary | ICD-10-CM | POA: Insufficient documentation

## 2013-03-26 DIAGNOSIS — W19XXXA Unspecified fall, initial encounter: Secondary | ICD-10-CM

## 2013-03-26 DIAGNOSIS — Z87442 Personal history of urinary calculi: Secondary | ICD-10-CM | POA: Insufficient documentation

## 2013-03-26 DIAGNOSIS — K219 Gastro-esophageal reflux disease without esophagitis: Secondary | ICD-10-CM | POA: Insufficient documentation

## 2013-03-26 DIAGNOSIS — F3289 Other specified depressive episodes: Secondary | ICD-10-CM | POA: Insufficient documentation

## 2013-03-26 DIAGNOSIS — S0990XA Unspecified injury of head, initial encounter: Secondary | ICD-10-CM | POA: Insufficient documentation

## 2013-03-26 DIAGNOSIS — Z23 Encounter for immunization: Secondary | ICD-10-CM | POA: Insufficient documentation

## 2013-03-26 DIAGNOSIS — Z8711 Personal history of peptic ulcer disease: Secondary | ICD-10-CM | POA: Insufficient documentation

## 2013-03-26 DIAGNOSIS — Y9289 Other specified places as the place of occurrence of the external cause: Secondary | ICD-10-CM | POA: Insufficient documentation

## 2013-03-26 DIAGNOSIS — Z87891 Personal history of nicotine dependence: Secondary | ICD-10-CM | POA: Insufficient documentation

## 2013-03-26 DIAGNOSIS — S0180XA Unspecified open wound of other part of head, initial encounter: Secondary | ICD-10-CM | POA: Insufficient documentation

## 2013-03-26 DIAGNOSIS — E785 Hyperlipidemia, unspecified: Secondary | ICD-10-CM | POA: Insufficient documentation

## 2013-03-26 DIAGNOSIS — Z8669 Personal history of other diseases of the nervous system and sense organs: Secondary | ICD-10-CM | POA: Insufficient documentation

## 2013-03-26 DIAGNOSIS — M109 Gout, unspecified: Secondary | ICD-10-CM | POA: Insufficient documentation

## 2013-03-26 DIAGNOSIS — Z7982 Long term (current) use of aspirin: Secondary | ICD-10-CM | POA: Insufficient documentation

## 2013-03-26 DIAGNOSIS — Z79899 Other long term (current) drug therapy: Secondary | ICD-10-CM | POA: Insufficient documentation

## 2013-03-26 DIAGNOSIS — S01111A Laceration without foreign body of right eyelid and periocular area, initial encounter: Secondary | ICD-10-CM

## 2013-03-26 DIAGNOSIS — W010XXA Fall on same level from slipping, tripping and stumbling without subsequent striking against object, initial encounter: Secondary | ICD-10-CM | POA: Insufficient documentation

## 2013-03-26 DIAGNOSIS — F329 Major depressive disorder, single episode, unspecified: Secondary | ICD-10-CM | POA: Insufficient documentation

## 2013-03-26 DIAGNOSIS — M1990 Unspecified osteoarthritis, unspecified site: Secondary | ICD-10-CM | POA: Insufficient documentation

## 2013-03-26 DIAGNOSIS — IMO0002 Reserved for concepts with insufficient information to code with codable children: Secondary | ICD-10-CM | POA: Insufficient documentation

## 2013-03-26 DIAGNOSIS — S298XXA Other specified injuries of thorax, initial encounter: Secondary | ICD-10-CM | POA: Insufficient documentation

## 2013-03-26 MED ORDER — HYDROCODONE-ACETAMINOPHEN 5-325 MG PO TABS
1.0000 | ORAL_TABLET | Freq: Once | ORAL | Status: AC
  Administered 2013-03-26: 1 via ORAL
  Filled 2013-03-26: qty 1

## 2013-03-26 MED ORDER — TETANUS-DIPHTH-ACELL PERTUSSIS 5-2.5-18.5 LF-MCG/0.5 IM SUSP
0.5000 mL | Freq: Once | INTRAMUSCULAR | Status: AC
  Administered 2013-03-26: 0.5 mL via INTRAMUSCULAR
  Filled 2013-03-26: qty 0.5

## 2013-03-26 MED ORDER — LIDOCAINE-EPINEPHRINE (PF) 1 %-1:200000 IJ SOLN
10.0000 mL | Freq: Once | INTRAMUSCULAR | Status: AC
  Administered 2013-03-26: 10 mL via INTRADERMAL
  Filled 2013-03-26: qty 10

## 2013-03-26 NOTE — ED Notes (Signed)
Ambulated patient. Gait steady. No complaints of pain or dizziness with ambulation.

## 2013-03-26 NOTE — ED Notes (Signed)
Pt tripped and fell landing on concrete. Lac to right brow. C-Collar in place by EMS. Pt denies LOC. Fall occurred ~ 1 hour ago.

## 2013-03-26 NOTE — ED Notes (Signed)
Gave patient ginger ale as requested  

## 2013-03-26 NOTE — ED Provider Notes (Signed)
CSN: [id number]     Arrival date & time 03/26/13  1826 History  This chart was scribed for Glynn Octave, MD by [name] Scrape, ED Scribe. This patient was seen in room APA07/APA07 and the patient's care was started at 6:52 PM.   Chief Complaint  Patient presents with  . Fall  . Facial Laceration    The history is provided by the patient. No language interpreter was used.   HPI Comments: [name] [name] is a 79 y.o. male brought in by ambulance with a c-collar, who presents to the Emergency Department complaining of a trip and fall while walking in [employer] slip ons that occurred approximately one hour PTA. Pt states that he landed on concrete with positive head trauma but denies LOC. He reports an associated laceration to his right eyebrow, an abrasion to his third right finger with associated soreness and mild right rib pain described as soreness. The bleeding is controlled in all areas. He denies any preceding symptoms of dizziness or lightheadedness. He denies CP, back pain, visual disturbance and abdominal pain as associated symptoms. He has a h/o pacemaker placement but denies having any stents. His TD vaccine status is unknown.  PCP is Dr. [name] in [place].  Past Medical History  Diagnosis Date  . Osteoarthritis   . Gout   . Hypertension   . History of coronary artery disease   . GERD (gastroesophageal reflux disease)   . CHF (congestive heart failure)   . Diastolic dysfunction   . History of GI bleed   . History of pacemaker     Medtronic Adapta ADDro1  . Peptic ulcer disease   . BPH (benign prostatic hypertrophy)   . 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 Hyperlipidemia   . History of cerebrovascular disease   . Kidney stone   . Depression    Past Surgical History  Procedure Laterality Date  . Pacemaker insertion      Medtronic Adapta ADDro1  . Cholecystectomy    . Total knee arthroplasty    . Hernia repair    . Tonsillectomy    . Total knee  arthroplasty  08/28/2011    Procedure: TOTAL KNEE ARTHROPLASTY;  Surgeon: Drucilla Schmidt, MD;  Location: [facility];  Service: Orthopedics;  Laterality: Left;   Family History  Problem Relation Age of Onset  . Diabetes Mother   . Leukemia Father    History  Substance Use Topics  . Smoking status: Former Games developer  . Smokeless tobacco: Former Neurosurgeon    Quit date: 08/13/1976  . Alcohol Use: No    Review of Systems  A complete 10 system review of systems was obtained and all systems are negative except as noted in the HPI and PMH.   Allergies  Review of patient's allergies indicates no known allergies.  Home Medications   Current Outpatient Rx  Name  Route  Sig  Dispense  Refill  . allopurinol (ZYLOPRIM) 300 MG tablet   Oral   Take 300 mg by mouth daily.          Marland Kitchen aspirin 81 MG tablet   Oral   Take 81 mg by mouth every morning.          Marland Kitchen atorvastatin (LIPITOR) 80 MG tablet   Oral   Take 40 mg by mouth at bedtime.          . finasteride (PROSCAR) 5 MG tablet   Oral   Take 5 mg by  mouth daily.          . furosemide (LASIX) 40 MG tablet      Take 40 mg by mouth daily.   30 tablet   3   . losartan (COZAAR) 100 MG tablet   Oral   Take 100 mg by mouth daily.         . metoprolol (LOPRESSOR) 50 MG tablet   Oral   Take 25 mg by mouth 2 (two) times daily.         . Multiple Vitamins-Minerals (OCUVITE EXTRA) TABS   Oral   Take 2 tablets by mouth 2 (two) times daily.          . pantoprazole (PROTONIX) 40 MG tablet   Oral   Take 40 mg by mouth 2 (two) times daily.          . polyethylene glycol powder (GLYCOLAX/MIRALAX) powder   Oral   Take 17 g by mouth daily as needed (for constipation).         . sertraline (ZOLOFT) 100 MG tablet   Oral   Take 100 mg by mouth every morning.          Triage Vitals: BP 150/57  Pulse 64  Temp(Src) 98.5 F (36.9 C) (Oral)  Resp 14  SpO2 94%  Physical Exam  Nursing note and vitals  reviewed. Constitutional: He is oriented to person, place, and time. He appears well-developed and well-nourished. No distress.  HENT:  Head: Normocephalic.  1 cm laceration with abrasion to the right eyebrow with a surrounding hematoma   Eyes: Conjunctivae and EOM are normal.  Neck: Normal range of motion. Neck supple. No tracheal deviation present.  No cervical spine tenderness  Cardiovascular: Normal rate, regular rhythm and normal heart sounds.   No murmur heard. Pulmonary/Chest: Effort normal and breath sounds normal. No respiratory distress. He has no wheezes. He has no rales. He exhibits tenderness (right lower rib tenderness, no ecchymosis or crepitance).  Abdominal: Soft. Bowel sounds are normal. There is no tenderness.  Musculoskeletal: Normal range of motion. He exhibits no edema.  R third digit FDS, FDP, extensors intact. +2 radial pulse  Neurological: He is alert and oriented to person, place, and time. No cranial nerve deficit.  Skin: Skin is warm and dry.  contusions to right proximal forearm, 2+ radial pulse, abrasion to the third PIP joint of the right hand, abrasions to the lateral fifth digit of the right hand  Psychiatric: He has a normal mood and affect. His behavior is normal.    ED Course  Procedures (including critical care time)  Medications  TDaP (BOOSTRIX) injection 0.5 mL (0.5 mLs Intramuscular Given 03/26/13 1946)  HYDROcodone-acetaminophen (NORCO/VICODIN) 5-325 MG per tablet 1 tablet (1 tablet Oral Given 03/26/13 1907)  lidocaine-EPINEPHrine (XYLOCAINE-EPINEPHrine) 1 %-1:200000 (with pres) injection 10 mL (10 mLs Intradermal Given by Other 03/26/13 1948)    DIAGNOSTIC STUDIES: Oxygen Saturation is 94% on room air, adequate by my interpretation.    COORDINATION OF CARE: 6:55 PM-C-collar removed. Discussed treatment plan which includes tetanus booster, pain medication and x-rays of the right hand and chest, CT scans of the head and neck with pt at bedside and  pt agreed to plan.    LACERATION REPAIR PROCEDURE NOTE The patient's identification was confirmed and consent was obtained. This procedure was performed by Glynn Octave, MD at 7:36 PM. Site: right eyebrow Sterile procedures observed Anesthetic used (type and amt): 4 cc of 1% lidocaine with epinephrine  Suture type/size:  5-0 prolene Length:1 cm # of Sutures: 3 Technique: simple interrupted  Complexity: simple, well-approximated Antibx ointment applied Tetanus ordered Site anesthetized, irrigated with NS, explored without evidence of foreign body, wound well approximated, site covered with dry, sterile dressing.  Patient tolerated procedure well without complications. Instructions for care discussed verbally and patient provided with additional written instructions for homecare and f/u in 5 to 7 days at the ED, an UC or PMD office.  7:43 PM-Informed pt of negative radiology results. Discussed discharge plan which includes PO challenge and TD vaccine with pt and pt agreed to plan. Also advised pt to follow up as discussed above. Addressed symptoms to return for with pt.   Labs Review Labs Reviewed - No data to display Imaging Review Dg Chest 2 View  03/26/2013   CLINICAL DATA:  Right hand, rib, and arm pain post fall  EXAM: CHEST  2 VIEW  COMPARISON:  01/15/2013  FINDINGS: Left subclavian sequential transvenous pacemaker leads project at right atrium and right ventricle.  Enlargement of cardiac silhouette with pulmonary vascular congestion.  Atherosclerotic calcification aorta.  Mediastinal contours normal.  Mild chronic peribronchial thickening with minimal right basilar atelectasis.  No acute infiltrate, pleural effusion or pneumothorax.  No definite fractures.  IMPRESSION: No radiographic evidence acute injury.  Minimal right basilar atelectasis.  Enlargement of cardiac silhouette with pulmonary vascular congestion and pacemaker.   Electronically Signed   By: Ulyses Southward M.D.   On:  03/26/2013 19:34   Ct Head Wo Contrast  03/26/2013   CLINICAL DATA:  Fall, facial laceration, history hypertension, CHF, coronary artery disease  EXAM: CT HEAD WITHOUT CONTRAST  CT CERVICAL SPINE WITHOUT CONTRAST  TECHNIQUE: Multidetector CT imaging of the head and cervical spine was performed following the standard protocol without intravenous contrast. Multiplanar CT image reconstructions of the cervical spine were also generated.  COMPARISON:  CT head 11/09/2005  FINDINGS: CT HEAD FINDINGS  Generalized atrophy.  Few scattered streak artifacts.  Normal ventricular morphology.  No midline shift or mass effect.  Grossly normal appearance of brain parenchyma.  No intracranial hemorrhage, mass lesion, evidence acute infarction.  No extra-axial fluid collections.  Atherosclerotic calcifications at skullbase.  Bones and sinuses unremarkable.  CT CERVICAL SPINE FINDINGS  Visualized skullbase intact.  Scattered atherosclerotic calcifications of the carotid systems bilaterally.  Bones appear mildly demineralized.  Multilevel degenerative disc and facet disease changes of the cervical spine.  Prevertebral soft tissues normal thickness.  Vertebral body heights maintained without fracture or subluxation.  Question fusion of C5-C6.  IMPRESSION: CT HEAD IMPRESSION  No acute intracranial abnormalities.  CT CERVICAL SPINE IMPRESSION  Degenerative disc and facet disease changes of the cervical spine.  No acute abnormalities.   Electronically Signed   By: Ulyses Southward M.D.   On: 03/26/2013 19:29   Ct Cervical Spine Wo Contrast  03/26/2013   CLINICAL DATA:  Fall, facial laceration, history hypertension, CHF, coronary artery disease  EXAM: CT HEAD WITHOUT CONTRAST  CT CERVICAL SPINE WITHOUT CONTRAST  TECHNIQUE: Multidetector CT imaging of the head and cervical spine was performed following the standard protocol without intravenous contrast. Multiplanar CT image reconstructions of the cervical spine were also generated.   COMPARISON:  CT head 11/09/2005  FINDINGS: CT HEAD FINDINGS  Generalized atrophy.  Few scattered streak artifacts.  Normal ventricular morphology.  No midline shift or mass effect.  Grossly normal appearance of brain parenchyma.  No intracranial hemorrhage, mass lesion, evidence acute infarction.  No extra-axial fluid collections.  Atherosclerotic  calcifications at skullbase.  Bones and sinuses unremarkable.  CT CERVICAL SPINE FINDINGS  Visualized skullbase intact.  Scattered atherosclerotic calcifications of the carotid systems bilaterally.  Bones appear mildly demineralized.  Multilevel degenerative disc and facet disease changes of the cervical spine.  Prevertebral soft tissues normal thickness.  Vertebral body heights maintained without fracture or subluxation.  Question fusion of C5-C6.  IMPRESSION: CT HEAD IMPRESSION  No acute intracranial abnormalities.  CT CERVICAL SPINE IMPRESSION  Degenerative disc and facet disease changes of the cervical spine.  No acute abnormalities.   Electronically Signed   By: Ulyses Southward M.D.   On: 03/26/2013 19:29   Dg Hand Complete Right  03/26/2013   CLINICAL DATA:  Right hand and arm pain post fall  EXAM: RIGHT HAND - COMPLETE 3+ VIEW  COMPARISON:  11/10/2005  FINDINGS: Osseous mineralization normal.  Fingers partially superimposed on lateral view limiting assessment.  Soft tissue swelling overlies the dorsum of the hand at the MCP joints.  Joint space narrowing is seen throughout the MCP joints.  Spur formation at the 3rd metacarpal head.  No acute fracture, dislocation, or bone destruction.  IMPRESSION: No acute osseous abnormalities.  Scattered degenerative changes.   Electronically Signed   By: Ulyses Southward M.D.   On: 03/26/2013 19:36    MDM   1. Fall, initial encounter   2. Eyebrow laceration, right, initial encounter    Mechanical fall while walking and hit head on concrete. Denies loss of consciousness. Laceration to right eyebrow. Abrasion and hematoma to the  right forearm and right hand. Tetanus is not up-to-date. Patient denies any dizziness, lightheadedness, chest pain, shortness of breath or syncope. He has mild tenderness to his right lateral ribs CT head and C-spine negative. Tetanus updated. Laceration repaired as above.  X-rays negative for fracture. Patient tolerating by mouth and ambulatory in the ED without assistance. Followup for suture removal 1 week. Return precautions discussed.    Date: 03/26/2013  Rate: 68  Rhythm: paced  QRS Axis: normal  Intervals: normal  ST/T Wave abnormalities: normal  Conduction Disutrbances:none  Narrative Interpretation:   Old EKG Reviewed: unchanged   I personally performed the services described in this documentation, which was scribed in my presence. The recorded information has been reviewed and is accurate.    Glynn Octave, MD 03/26/13 2055

## 2013-03-30 ENCOUNTER — Encounter (INDEPENDENT_AMBULATORY_CARE_PROVIDER_SITE_OTHER): Payer: Medicare Other

## 2013-03-30 DIAGNOSIS — I6529 Occlusion and stenosis of unspecified carotid artery: Secondary | ICD-10-CM

## 2013-04-01 ENCOUNTER — Encounter: Payer: Self-pay | Admitting: Internal Medicine

## 2013-04-05 ENCOUNTER — Other Ambulatory Visit: Payer: Self-pay | Admitting: Cardiology

## 2013-05-06 ENCOUNTER — Other Ambulatory Visit: Payer: Self-pay

## 2013-05-18 ENCOUNTER — Encounter: Payer: Self-pay | Admitting: Internal Medicine

## 2013-05-18 ENCOUNTER — Ambulatory Visit (INDEPENDENT_AMBULATORY_CARE_PROVIDER_SITE_OTHER): Payer: Medicare Other | Admitting: Internal Medicine

## 2013-05-18 VITALS — BP 98/44 | HR 58 | Ht 68.5 in | Wt 195.0 lb

## 2013-05-18 DIAGNOSIS — I959 Hypotension, unspecified: Secondary | ICD-10-CM

## 2013-05-18 DIAGNOSIS — I442 Atrioventricular block, complete: Secondary | ICD-10-CM

## 2013-05-18 DIAGNOSIS — W19XXXA Unspecified fall, initial encounter: Secondary | ICD-10-CM | POA: Insufficient documentation

## 2013-05-18 DIAGNOSIS — I5032 Chronic diastolic (congestive) heart failure: Secondary | ICD-10-CM

## 2013-05-18 DIAGNOSIS — Z950 Presence of cardiac pacemaker: Secondary | ICD-10-CM

## 2013-05-18 LAB — MDC_IDC_ENUM_SESS_TYPE_INCLINIC
Battery Impedance: 1913 Ohm
Battery Voltage: 2.72 V
Brady Statistic AP VP Percent: 89 %
Brady Statistic AP VS Percent: 0 %
Brady Statistic AS VP Percent: 11 %
Brady Statistic AS VS Percent: 0 %
Date Time Interrogation Session: 20141118162823
Lead Channel Impedance Value: 448 Ohm
Lead Channel Pacing Threshold Amplitude: 0.5 V
Lead Channel Pacing Threshold Pulse Width: 0.4 ms
Lead Channel Sensing Intrinsic Amplitude: 2.8 mV
Lead Channel Setting Pacing Amplitude: 2 V
Lead Channel Setting Pacing Amplitude: 2.5 V
Lead Channel Setting Pacing Pulse Width: 0.4 ms
Lead Channel Setting Sensing Sensitivity: 5.6 mV

## 2013-05-18 MED ORDER — LOSARTAN POTASSIUM 25 MG PO TABS: 25.0000 mg | ORAL_TABLET | Freq: Every day | ORAL | Status: DC

## 2013-05-18 NOTE — Assessment & Plan Note (Signed)
The patient's device was interrogated.  The information was reviewed. No changes were made in the programming.    

## 2013-05-18 NOTE — Assessment & Plan Note (Signed)
Stable post pacing 

## 2013-05-18 NOTE — Assessment & Plan Note (Signed)
He is euvolemic. Will continue current diuretics

## 2013-05-18 NOTE — Progress Notes (Signed)
Patient Care Team: Jarome Matin, MD as PCP - General (Internal Medicine)   HPI  [name] [name] is a 79 y.o. male seen in followup for pacemaker implanted for sinus node dysfunction that apparently triggered a cardiac arrest. This was done at [facility].   He has been having problems with orthostatic dizziness. He fell and hit his head. His blood pressures have been 80-95 range. He's had some problems with minimal edema.    He has a history of congestive heart failure, felt secondary to diastolic dysfunction; hypertension; hyperlipidemia; mild coronary artery disease; and cerebrovascular disease.  His most recent cardiac catheterization here was in April 2005. At that time, he had nonobstructive coronary artery disease, but he did have elevated right and left filling pressures. Last Myoview was performed on February 05, 2007. Ejection fraction was 65% and the perfusion was normal. His most recent echocardiogram was performed in February of 2013 and showed normal LV function, mild aortic stenosis with a mean gradient of 9 mmHg, mild mitral and tricuspid regurgitation and mild biatrial enlargement.  th.   Past Medical History  Diagnosis Date  . Osteoarthritis   . Gout   . Hypertension   . History of coronary artery disease   . GERD (gastroesophageal reflux disease)   . CHF (congestive heart failure)   . Diastolic dysfunction   . History of GI bleed   . History of pacemaker     Medtronic Adapta ADDro1  . Peptic ulcer disease   . BPH (benign prostatic hypertrophy)   . 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 Hyperlipidemia   . History of cerebrovascular disease   . Kidney stone   . Depression     Past Surgical History  Procedure Laterality Date  . Pacemaker insertion      Medtronic Adapta ADDro1  . Cholecystectomy    . Total knee arthroplasty    . Hernia repair    . Tonsillectomy    . Total knee arthroplasty  08/28/2011    Procedure: TOTAL KNEE ARTHROPLASTY;   Surgeon: Drucilla Schmidt, MD;  Location: [facility];  Service: Orthopedics;  Laterality: Left;    Current Outpatient Prescriptions  Medication Sig Dispense Refill  . allopurinol (ZYLOPRIM) 300 MG tablet Take 300 mg by mouth daily.       Marland Kitchen aspirin 81 MG tablet Take 81 mg by mouth every morning.       Marland Kitchen atorvastatin (LIPITOR) 80 MG tablet Take 40 mg by mouth at bedtime.       . finasteride (PROSCAR) 5 MG tablet Take 5 mg by mouth daily.       . furosemide (LASIX) 40 MG tablet Take 40 mg by mouth daily.  30 tablet  3  . losartan (COZAAR) 100 MG tablet Take 100 mg by mouth daily.      . metoprolol (LOPRESSOR) 50 MG tablet Take 25 mg by mouth 2 (two) times daily.      . metoprolol (LOPRESSOR) 50 MG tablet TAKE ONE-HALF TABLET BY MOUTH TWICE DAILY  60 tablet  0  . Multiple Vitamins-Minerals (OCUVITE EXTRA) TABS Take 2 tablets by mouth 2 (two) times daily.       . pantoprazole (PROTONIX) 40 MG tablet Take 40 mg by mouth 2 (two) times daily.       . polyethylene glycol powder (GLYCOLAX/MIRALAX) powder Take 17 g by mouth daily as needed (for constipation).      . sertraline (ZOLOFT) 100 MG tablet Take  100 mg by mouth every morning.       No current facility-administered medications for this visit.    No Known Allergies  Review of Systems negative except from HPI and PMH  Physical Exam BP 98/44  Pulse 58  Ht 5' 8.5" (1.74 m)  Wt 195 lb (88.451 kg)  BMI 29.21 kg/m2 Well developed and well nourished in no acute distress HENT normal E scleral and icterus clear Neck Supple JVP flat Device pocket well healed; without hematoma or erythema.  There is no tethering  Clear to ausculation Regular rate and rhythm, 2/6 murmur with an S4 Soft with active bowel sounds No clubbing cyanosis no Edema Alert and oriented, grossly normal motor and sensory function although his heel toe gait failed Skin Warm and Dry  Electrocardiogram demonstrates AV pacing  Assessment and  Plan

## 2013-05-18 NOTE — Assessment & Plan Note (Signed)
The patient is hypotensive and had a fall almost certainly associated with orthostatic intolerance. We will decrease his Cozaar from 100--25 mg. I think his systolic blood pressure 130 is a reasonable target. He will continue his diuretics.

## 2013-05-18 NOTE — Patient Instructions (Signed)
Your physician has recommended you make the following change in your medication:  1) Decrease Losartan to 25 mg daily 2) Continue taking metoprolol 25 mg (1/2 50mg  tablet) twice daily   Remote monitoring is used to monitor your Pacemaker of ICD from home. This monitoring reduces the number of office visits required to check your device to one time per year. It allows [place] to keep an eye on the functioning of your device to ensure it is working properly. You are scheduled for a device check from home on 08/19/2013. You may send your transmission at any time that day. If you have a wireless device, the transmission will be sent automatically. After your physician reviews your transmission, you will receive a postcard with your next transmission date  Your physician wants you to follow-up in: one year with Dr. Graciela Husbands.  You will receive a reminder letter in the mail two months in advance. If you don't receive a letter, please call our office to schedule the follow-up appointment.

## 2013-06-28 ENCOUNTER — Other Ambulatory Visit: Payer: Self-pay | Admitting: Cardiology

## 2013-08-19 ENCOUNTER — Ambulatory Visit (INDEPENDENT_AMBULATORY_CARE_PROVIDER_SITE_OTHER): Payer: Medicare Other | Admitting: *Deleted

## 2013-08-19 DIAGNOSIS — I442 Atrioventricular block, complete: Secondary | ICD-10-CM

## 2013-08-19 LAB — MDC_IDC_ENUM_SESS_TYPE_REMOTE
Battery Impedance: 2051 Ohm
Battery Remaining Longevity: 21 mo
Battery Voltage: 2.71 V
Brady Statistic AP VP Percent: 95 %
Brady Statistic AS VP Percent: 5 %
Lead Channel Impedance Value: 444 Ohm
Lead Channel Impedance Value: 524 Ohm
Lead Channel Pacing Threshold Amplitude: 0.625 V
Lead Channel Setting Pacing Amplitude: 2 V
Lead Channel Setting Pacing Amplitude: 2.5 V
Lead Channel Setting Pacing Pulse Width: 0.4 ms
Lead Channel Setting Sensing Sensitivity: 5.6 mV
MDC IDC MSMT LEADCHNL RA PACING THRESHOLD AMPLITUDE: 0.5 V
MDC IDC MSMT LEADCHNL RA PACING THRESHOLD PULSEWIDTH: 0.4 ms
MDC IDC MSMT LEADCHNL RV PACING THRESHOLD PULSEWIDTH: 0.4 ms
MDC IDC SESS DTM: 20150219140731
MDC IDC STAT BRADY AP VS PERCENT: 0 %
MDC IDC STAT BRADY AS VS PERCENT: 0 %

## 2013-08-24 ENCOUNTER — Other Ambulatory Visit: Payer: Self-pay | Admitting: Cardiology

## 2013-09-20 ENCOUNTER — Encounter: Payer: Self-pay | Admitting: *Deleted

## 2013-09-27 ENCOUNTER — Other Ambulatory Visit (HOSPITAL_COMMUNITY): Payer: Self-pay | Admitting: Cardiology

## 2013-09-27 DIAGNOSIS — I6529 Occlusion and stenosis of unspecified carotid artery: Secondary | ICD-10-CM

## 2013-09-28 ENCOUNTER — Encounter: Payer: Self-pay | Admitting: Internal Medicine

## 2013-09-30 ENCOUNTER — Encounter: Payer: Self-pay | Admitting: Cardiovascular Disease

## 2013-09-30 ENCOUNTER — Ambulatory Visit (HOSPITAL_COMMUNITY): Payer: Medicare Other | Attending: Cardiovascular Disease | Admitting: Cardiology

## 2013-09-30 DIAGNOSIS — I6529 Occlusion and stenosis of unspecified carotid artery: Secondary | ICD-10-CM

## 2013-09-30 NOTE — Progress Notes (Signed)
Carotid duplex performed 

## 2013-10-01 ENCOUNTER — Other Ambulatory Visit: Payer: Self-pay | Admitting: Cardiology

## 2013-10-11 ENCOUNTER — Ambulatory Visit (INDEPENDENT_AMBULATORY_CARE_PROVIDER_SITE_OTHER): Payer: Medicare Other | Admitting: Physician Assistant

## 2013-10-11 ENCOUNTER — Encounter: Payer: Self-pay | Admitting: Physician Assistant

## 2013-10-11 VITALS — BP 143/64 | HR 81 | Ht 68.5 in | Wt 187.2 lb

## 2013-10-11 DIAGNOSIS — R0609 Other forms of dyspnea: Secondary | ICD-10-CM

## 2013-10-11 DIAGNOSIS — R0600 Dyspnea, unspecified: Secondary | ICD-10-CM

## 2013-10-11 DIAGNOSIS — R0989 Other specified symptoms and signs involving the circulatory and respiratory systems: Secondary | ICD-10-CM

## 2013-10-11 DIAGNOSIS — R079 Chest pain, unspecified: Secondary | ICD-10-CM

## 2013-10-11 DIAGNOSIS — I6529 Occlusion and stenosis of unspecified carotid artery: Secondary | ICD-10-CM

## 2013-10-11 DIAGNOSIS — R0789 Other chest pain: Secondary | ICD-10-CM

## 2013-10-11 NOTE — Progress Notes (Signed)
HPI:   This is an 80 year old white male patient of Dr. Jens Som and Dr. Graciela Husbands who has history of diastolic heart failure, hypertension, hyperlipidemia, and mild CAD and cerebrovascular disease. Head pacemaker placed in December 2008 following a cardiac arrest at [facility]. Pacer was placed at [facility] for bradycardia. His last cath was in April 2005 at which time he had nonobstructive CAD. He did have elevated right and left filling pressures. Last Myoview was in 2008, ejection fraction was 65% and perfusion was normal. Last echo was November 2013 EF was 50-55% with mild aortic insufficiency he most recently had carotid Dopplers in 09/30/13 that showed 40-59% RICA stenosis and 60-79% LICA stenosis.   Patient complains of some chest tightness on occasion that last 3-4 minutes and eases spontaneously. This has not necessarily been exertional related. It can happen at rest. He has not used nitroglycerin. He also has chronic dyspnea on exertion that he thinks has worsened. The patient also is having a workup for some GI problems he describes as feeling full after only a few bites of food. He sees his renal Dr. next week.    No Known Allergies  Current Outpatient Prescriptions on File Prior to Visit: allopurinol (ZYLOPRIM) 300 MG tablet, Take 300 mg by mouth daily. , Disp: , Rfl:  aspirin 81 MG tablet, Take 81 mg by mouth every morning. , Disp: , Rfl:  atorvastatin (LIPITOR) 80 MG tablet, Take 40 mg by mouth at bedtime. , Disp: , Rfl:  finasteride (PROSCAR) 5 MG tablet, Take 5 mg by mouth daily. , Disp: , Rfl:   furosemide (LASIX) 40 MG tablet, TAKE ONE TABLET BY MOUTH ONCE DAILY, Disp: 30 tablet, Rfl: 0 losartan (COZAAR) 25 MG tablet, Take 1 tablet (25 mg total) by mouth daily., Disp: 90 tablet, Rfl: 3 metoprolol (LOPRESSOR) 50 MG tablet, TAKE ONE-HALF TABLET BY MOUTH TWICE DAILY, Disp: 60 tablet, Rfl: 6 Multiple Vitamins-Minerals (OCUVITE EXTRA) TABS, Take 2 tablets by mouth 2 (two)  times daily. , Disp: , Rfl:  pantoprazole (PROTONIX) 40 MG tablet, Take 40 mg by mouth 2 (two) times daily. , Disp: , Rfl:  polyethylene glycol powder (GLYCOLAX/MIRALAX) powder, Take 17 g by mouth daily as needed (for constipation)., Disp: , Rfl:  sertraline (ZOLOFT) 100 MG tablet, Take 100 mg by mouth every morning., Disp: , Rfl:   No current facility-administered medications on file prior to visit.   Past Medical History:   Osteoarthritis                                               Gout                                                         Hypertension                                                 History of coronary artery disease  GERD (gastroesophageal reflux disease)                       CHF (congestive heart failure)                               Diastolic dysfunction                                        History of GI bleed                                          History of pacemaker                                           Comment:Medtronic Adapta ADDro1   Peptic ulcer disease                                         BPH (benign prostatic hypertrophy)                           Macular degeneration, right eye                                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   Hyperlipidemia                                               History of cerebrovascular disease                           Kidney stone                                                 Depression                                                  Past Surgical History:   PACEMAKER INSERTION                                             Comment:Medtronic Adapta ADDro1   CHOLECYSTECTOMY                                               TOTAL KNEE ARTHROPLASTY  HERNIA REPAIR                                                 TONSILLECTOMY                                                 TOTAL KNEE ARTHROPLASTY                          08/28/2011       Comment:Procedure: TOTAL KNEE ARTHROPLASTY;  Surgeon:               Drucilla Schmidt, MD;  Location: [facility];                Service: Orthopedics;  Laterality: Left;  Review of patient's family history indicates:   Diabetes                       Mother                   Leukemia                       Father                   Social History   Marital Status: Married             Spouse Name:                      Years of Education:                 Number of children:             Occupational History Occupation          Associate Professor            Comment              Retired                                   Social History Main Topics   Smoking Status: Former Smoker                   Packs/Day: 0.00  Years:         Smokeless Status: Former Neurosurgeon                        Quit date: 08/13/1976   Alcohol Use: No             Drug Use: No             Sexual Activity: No                 Other Topics            Concern   None on file  Social History Narrative   Married for 20 years   2 children and 3 grandchildren    ROS: See history of present illness otherwise negative   PHYSICAL EXAM: Obese, in no acute  distress. Neck: Bilateral carotid bruits No JVD, HJR, or thyroid enlargement  Lungs: No tachypnea, clear without wheezing, rales, or rhonchi  Cardiovascular: RRR, PMI not displaced, heart sounds distant, 1-2/6 diastolic murmur at the left sternal border, no  gallops, bruit, thrill, or heave.  Abdomen: BS normal. Soft without organomegaly, masses, lesions or tenderness.  Extremities: Trace of ankle edema otherwise lower extremities without cyanosis, clubbing. Good distal pulses bilateral  SKin: Warm, no lesions or rashes   Musculoskeletal: No deformities  Neuro: no focal signs  BP 143/64  Pulse 81  Ht 5' 8.5" (1.74 m)  Wt 187 lb 3.2 oz (84.913 kg)  BMI 28.05 kg/m2   EKG: Atrial paced with left bundle branch block  2-D echo 05/2012 Study Conclusions  - Left ventricle:  The cavity size was normal. Wall thickness   was normal. Systolic function was normal. The estimated   ejection fraction was in the range of 50% to 55%. There   was an increased relative contribution of atrial   contraction to ventricular filling. - Aortic valve: Moderately calcified annulus. Mild   regurgitation. - Left atrium: The atrium was mildly dilated. - Right atrium: The atrium was mildly dilated.

## 2013-10-11 NOTE — Assessment & Plan Note (Signed)
Patient's dyspnea on exertion has worsened. He has a history of normal LV function with mild aortic insufficiency. We'll order a stress Myoview to rule out ischemia. Dr. Jens Somrenshaw talked about ordering another 2-D echo in August. I will have him see Dr. Jens Somrenshaw back after the stress test.

## 2013-10-11 NOTE — Assessment & Plan Note (Signed)
Carotids on 09/30/13 remained stable. Followup in 1 year.

## 2013-10-11 NOTE — Patient Instructions (Addendum)
Your physician has requested that you have an exercise stress myoview. For further information please visit [email or web address]. Please follow instruction sheet, as given.   Your physician recommends that you schedule a follow-up appointment in: 1 month with DR. Crenshaw  Your physician recommends that you continue on your current medications as directed. Please refer to the Current Medication list given to you today.

## 2013-10-11 NOTE — Assessment & Plan Note (Signed)
Patient complains of recent trouble with chest tightness lasting 3-4 minutes mostly at rest. Associated with some shortness of breath. I will order a stress Myoview to rule out ischemia. Patient would like to try walking on a treadmill. Hold metoprolol. Followup with Dr. Jens Somrenshaw.

## 2013-10-12 ENCOUNTER — Inpatient Hospital Stay (HOSPITAL_COMMUNITY)
Admission: EM | Admit: 2013-10-12 | Discharge: 2013-10-17 | DRG: 439 | Disposition: A | Discharge status: Home / Self Care | Payer: Medicare Other | Attending: Internal Medicine | Admitting: Internal Medicine

## 2013-10-12 ENCOUNTER — Encounter (HOSPITAL_COMMUNITY): Payer: Self-pay | Admitting: Emergency Medicine

## 2013-10-12 ENCOUNTER — Emergency Department (HOSPITAL_COMMUNITY): Payer: Medicare Other

## 2013-10-12 DIAGNOSIS — M109 Gout, unspecified: Secondary | ICD-10-CM | POA: Diagnosis present

## 2013-10-12 DIAGNOSIS — I442 Atrioventricular block, complete: Secondary | ICD-10-CM

## 2013-10-12 DIAGNOSIS — R7989 Other specified abnormal findings of blood chemistry: Secondary | ICD-10-CM | POA: Diagnosis present

## 2013-10-12 DIAGNOSIS — M7989 Other specified soft tissue disorders: Secondary | ICD-10-CM

## 2013-10-12 DIAGNOSIS — K219 Gastro-esophageal reflux disease without esophagitis: Secondary | ICD-10-CM | POA: Diagnosis present

## 2013-10-12 DIAGNOSIS — I959 Hypotension, unspecified: Secondary | ICD-10-CM

## 2013-10-12 DIAGNOSIS — E785 Hyperlipidemia, unspecified: Secondary | ICD-10-CM | POA: Diagnosis present

## 2013-10-12 DIAGNOSIS — Z96659 Presence of unspecified artificial knee joint: Secondary | ICD-10-CM

## 2013-10-12 DIAGNOSIS — Z8711 Personal history of peptic ulcer disease: Secondary | ICD-10-CM

## 2013-10-12 DIAGNOSIS — N2889 Other specified disorders of kidney and ureter: Secondary | ICD-10-CM

## 2013-10-12 DIAGNOSIS — K5900 Constipation, unspecified: Secondary | ICD-10-CM | POA: Diagnosis present

## 2013-10-12 DIAGNOSIS — Z86718 Personal history of other venous thrombosis and embolism: Secondary | ICD-10-CM

## 2013-10-12 DIAGNOSIS — M1990 Unspecified osteoarthritis, unspecified site: Secondary | ICD-10-CM | POA: Diagnosis present

## 2013-10-12 DIAGNOSIS — N400 Enlarged prostate without lower urinary tract symptoms: Secondary | ICD-10-CM | POA: Diagnosis present

## 2013-10-12 DIAGNOSIS — Z9089 Acquired absence of other organs: Secondary | ICD-10-CM

## 2013-10-12 DIAGNOSIS — Z87891 Personal history of nicotine dependence: Secondary | ICD-10-CM

## 2013-10-12 DIAGNOSIS — Z7982 Long term (current) use of aspirin: Secondary | ICD-10-CM

## 2013-10-12 DIAGNOSIS — I6529 Occlusion and stenosis of unspecified carotid artery: Secondary | ICD-10-CM

## 2013-10-12 DIAGNOSIS — R0602 Shortness of breath: Secondary | ICD-10-CM

## 2013-10-12 DIAGNOSIS — F3289 Other specified depressive episodes: Secondary | ICD-10-CM | POA: Diagnosis present

## 2013-10-12 DIAGNOSIS — I350 Nonrheumatic aortic (valve) stenosis: Secondary | ICD-10-CM

## 2013-10-12 DIAGNOSIS — I129 Hypertensive chronic kidney disease with stage 1 through stage 4 chronic kidney disease, or unspecified chronic kidney disease: Secondary | ICD-10-CM | POA: Diagnosis present

## 2013-10-12 DIAGNOSIS — Z791 Long term (current) use of non-steroidal anti-inflammatories (NSAID): Secondary | ICD-10-CM

## 2013-10-12 DIAGNOSIS — R0600 Dyspnea, unspecified: Secondary | ICD-10-CM

## 2013-10-12 DIAGNOSIS — Z950 Presence of cardiac pacemaker: Secondary | ICD-10-CM

## 2013-10-12 DIAGNOSIS — Z8674 Personal history of sudden cardiac arrest: Secondary | ICD-10-CM

## 2013-10-12 DIAGNOSIS — N183 Chronic kidney disease, stage 3 unspecified: Secondary | ICD-10-CM | POA: Diagnosis present

## 2013-10-12 DIAGNOSIS — R2689 Other abnormalities of gait and mobility: Secondary | ICD-10-CM

## 2013-10-12 DIAGNOSIS — I2510 Atherosclerotic heart disease of native coronary artery without angina pectoris: Secondary | ICD-10-CM | POA: Diagnosis present

## 2013-10-12 DIAGNOSIS — Z87442 Personal history of urinary calculi: Secondary | ICD-10-CM

## 2013-10-12 DIAGNOSIS — K859 Acute pancreatitis without necrosis or infection, unspecified: Principal | ICD-10-CM | POA: Diagnosis present

## 2013-10-12 DIAGNOSIS — Z79899 Other long term (current) drug therapy: Secondary | ICD-10-CM

## 2013-10-12 DIAGNOSIS — W19XXXA Unspecified fall, initial encounter: Secondary | ICD-10-CM

## 2013-10-12 DIAGNOSIS — F329 Major depressive disorder, single episode, unspecified: Secondary | ICD-10-CM | POA: Diagnosis present

## 2013-10-12 DIAGNOSIS — R0789 Other chest pain: Secondary | ICD-10-CM

## 2013-10-12 DIAGNOSIS — I5032 Chronic diastolic (congestive) heart failure: Secondary | ICD-10-CM

## 2013-10-12 DIAGNOSIS — N179 Acute kidney failure, unspecified: Secondary | ICD-10-CM | POA: Diagnosis present

## 2013-10-12 DIAGNOSIS — H3530 Unspecified macular degeneration: Secondary | ICD-10-CM | POA: Diagnosis present

## 2013-10-12 DIAGNOSIS — D649 Anemia, unspecified: Secondary | ICD-10-CM | POA: Diagnosis present

## 2013-10-12 LAB — I-STAT CHEM 8, ED
BUN: 43 mg/dL — ABNORMAL HIGH (ref 6–23)
CALCIUM ION: 1.18 mmol/L (ref 1.13–1.30)
Chloride: 101 mEq/L (ref 96–112)
Creatinine, Ser: 1.9 mg/dL — ABNORMAL HIGH (ref 0.50–1.35)
Glucose, Bld: 96 mg/dL (ref 70–99)
HEMATOCRIT: 40 % (ref 39.0–52.0)
HEMOGLOBIN: 13.6 g/dL (ref 13.0–17.0)
POTASSIUM: 4.8 meq/L (ref 3.7–5.3)
Sodium: 140 mEq/L (ref 137–147)
TCO2: 29 mmol/L (ref 0–100)

## 2013-10-12 LAB — COMPREHENSIVE METABOLIC PANEL
ALK PHOS: 447 U/L — AB (ref 39–117)
ALT: 29 U/L (ref 0–53)
AST: 48 U/L — AB (ref 0–37)
Albumin: 3.5 g/dL (ref 3.5–5.2)
BUN: 46 mg/dL — ABNORMAL HIGH (ref 6–23)
CO2: 28 mEq/L (ref 19–32)
Calcium: 9.4 mg/dL (ref 8.4–10.5)
Chloride: 99 mEq/L (ref 96–112)
Creatinine, Ser: 1.8 mg/dL — ABNORMAL HIGH (ref 0.50–1.35)
GFR calc Af Amer: 37 mL/min — ABNORMAL LOW (ref >90)
GFR calc non Af Amer: 32 mL/min — ABNORMAL LOW (ref >90)
Glucose, Bld: 99 mg/dL (ref 70–99)
POTASSIUM: 5 meq/L (ref 3.7–5.3)
SODIUM: 140 meq/L (ref 137–147)
TOTAL PROTEIN: 7.4 g/dL (ref 6.0–8.3)
Total Bilirubin: 0.4 mg/dL (ref 0.3–1.2)

## 2013-10-12 LAB — CBC WITH DIFFERENTIAL/PLATELET
BASOS ABS: 0 10*3/uL (ref 0.0–0.1)
BASOS PCT: 0 % (ref 0–1)
EOS ABS: 0.5 10*3/uL (ref 0.0–0.7)
Eosinophils Relative: 7 % — ABNORMAL HIGH (ref 0–5)
HCT: 36.4 % — ABNORMAL LOW (ref 39.0–52.0)
Hemoglobin: 12.4 g/dL — ABNORMAL LOW (ref 13.0–17.0)
Lymphocytes Relative: 17 % (ref 12–46)
Lymphs Abs: 1.2 10*3/uL (ref 0.7–4.0)
MCH: 34.3 pg — AB (ref 26.0–34.0)
MCHC: 34.1 g/dL (ref 30.0–36.0)
MCV: 100.8 fL — ABNORMAL HIGH (ref 78.0–100.0)
Monocytes Absolute: 0.5 10*3/uL (ref 0.1–1.0)
Monocytes Relative: 7 % (ref 3–12)
NEUTROS PCT: 69 % (ref 43–77)
Neutro Abs: 4.9 10*3/uL (ref 1.7–7.7)
PLATELETS: 219 10*3/uL (ref 150–400)
RBC: 3.61 MIL/uL — ABNORMAL LOW (ref 4.22–5.81)
RDW: 13.7 % (ref 11.5–15.5)
WBC: 7.2 10*3/uL (ref 4.0–10.5)

## 2013-10-12 LAB — LIPASE, BLOOD: Lipase: 2051 U/L — ABNORMAL HIGH (ref 11–59)

## 2013-10-12 LAB — I-STAT CG4 LACTIC ACID, ED: LACTIC ACID, VENOUS: 1.11 mmol/L (ref 0.5–2.2)

## 2013-10-12 LAB — TRIGLYCERIDES: Triglycerides: 97 mg/dL (ref <150)

## 2013-10-12 LAB — URINALYSIS, ROUTINE W REFLEX MICROSCOPIC
Bilirubin Urine: NEGATIVE
Glucose, UA: NEGATIVE mg/dL
Hgb urine dipstick: NEGATIVE
KETONES UR: NEGATIVE mg/dL
LEUKOCYTES UA: NEGATIVE
NITRITE: NEGATIVE
PH: 5 (ref 5.0–8.0)
Protein, ur: NEGATIVE mg/dL
Specific Gravity, Urine: 1.015 (ref 1.005–1.030)
Urobilinogen, UA: 0.2 mg/dL (ref 0.0–1.0)

## 2013-10-12 LAB — GLUCOSE, CAPILLARY: Glucose-Capillary: 85 mg/dL (ref 70–99)

## 2013-10-12 LAB — TROPONIN I: Troponin I: <0.3 ng/mL (ref <0.30)

## 2013-10-12 MED ORDER — ONDANSETRON HCL 4 MG/2ML IJ SOLN
4.0000 mg | Freq: Once | INTRAMUSCULAR | Status: AC
  Administered 2013-10-12: 4 mg via INTRAVENOUS
  Filled 2013-10-12: qty 2

## 2013-10-12 MED ORDER — SODIUM CHLORIDE 0.9 % IJ SOLN: 3.0000 mL | Freq: Two times a day (BID) | INTRAMUSCULAR | Status: DC

## 2013-10-12 MED ORDER — SERTRALINE HCL 100 MG PO TABS
100.0000 mg | ORAL_TABLET | Freq: Every morning | ORAL | Status: DC
  Administered 2013-10-13 – 2013-10-17 (×5): 100 mg via ORAL
  Filled 2013-10-12 (×5): qty 1

## 2013-10-12 MED ORDER — METOPROLOL TARTRATE 25 MG PO TABS
25.0000 mg | ORAL_TABLET | Freq: Two times a day (BID) | ORAL | Status: DC
  Administered 2013-10-12 – 2013-10-17 (×10): 25 mg via ORAL
  Filled 2013-10-12 (×11): qty 1

## 2013-10-12 MED ORDER — ONDANSETRON HCL 4 MG/2ML IJ SOLN
4.0000 mg | Freq: Four times a day (QID) | INTRAMUSCULAR | Status: DC | PRN
  Administered 2013-10-12 – 2013-10-14 (×2): 4 mg via INTRAVENOUS
  Filled 2013-10-12 (×2): qty 2

## 2013-10-12 MED ORDER — SODIUM CHLORIDE 0.9 % IV SOLN
INTRAVENOUS | Status: DC
  Administered 2013-10-12 – 2013-10-13 (×2): via INTRAVENOUS

## 2013-10-12 MED ORDER — ONDANSETRON HCL 4 MG PO TABS: 4.0000 mg | ORAL_TABLET | Freq: Four times a day (QID) | ORAL | Status: DC | PRN

## 2013-10-12 MED ORDER — ALLOPURINOL 300 MG PO TABS
300.0000 mg | ORAL_TABLET | Freq: Every day | ORAL | Status: DC
  Administered 2013-10-13 – 2013-10-17 (×5): 300 mg via ORAL
  Filled 2013-10-12 (×5): qty 1

## 2013-10-12 MED ORDER — FINASTERIDE 5 MG PO TABS
5.0000 mg | ORAL_TABLET | Freq: Every day | ORAL | Status: DC
  Administered 2013-10-13 – 2013-10-17 (×5): 5 mg via ORAL
  Filled 2013-10-12 (×5): qty 1

## 2013-10-12 MED ORDER — LOSARTAN POTASSIUM 25 MG PO TABS
25.0000 mg | ORAL_TABLET | Freq: Every day | ORAL | Status: DC
  Administered 2013-10-13 – 2013-10-17 (×5): 25 mg via ORAL
  Filled 2013-10-12 (×5): qty 1

## 2013-10-12 MED ORDER — PANTOPRAZOLE SODIUM 40 MG PO TBEC
40.0000 mg | DELAYED_RELEASE_TABLET | Freq: Two times a day (BID) | ORAL | Status: DC
  Administered 2013-10-12 – 2013-10-17 (×10): 40 mg via ORAL
  Filled 2013-10-12 (×11): qty 1

## 2013-10-12 MED ORDER — ACETAMINOPHEN 325 MG PO TABS: 650.0000 mg | ORAL_TABLET | Freq: Four times a day (QID) | ORAL | Status: DC | PRN

## 2013-10-12 MED ORDER — MORPHINE SULFATE 4 MG/ML IJ SOLN
4.0000 mg | Freq: Once | INTRAMUSCULAR | Status: AC
  Administered 2013-10-12: 4 mg via INTRAVENOUS
  Filled 2013-10-12: qty 1

## 2013-10-12 MED ORDER — IOHEXOL 300 MG/ML  SOLN
50.0000 mL | Freq: Once | INTRAMUSCULAR | Status: AC | PRN
  Administered 2013-10-12: 50 mL via ORAL

## 2013-10-12 MED ORDER — ACETAMINOPHEN 650 MG RE SUPP
650.0000 mg | Freq: Four times a day (QID) | RECTAL | Status: DC | PRN
Start: 2013-10-12 — End: 2013-10-17

## 2013-10-12 MED ORDER — ASPIRIN 81 MG PO CHEW
81.0000 mg | CHEWABLE_TABLET | Freq: Every morning | ORAL | Status: DC
  Administered 2013-10-13 – 2013-10-17 (×5): 81 mg via ORAL
  Filled 2013-10-12 (×5): qty 1

## 2013-10-12 MED ORDER — MORPHINE SULFATE 2 MG/ML IJ SOLN
2.0000 mg | INTRAMUSCULAR | Status: DC | PRN
  Administered 2013-10-12 – 2013-10-15 (×6): 2 mg via INTRAVENOUS
  Filled 2013-10-12 (×6): qty 1

## 2013-10-12 MED ORDER — HEPARIN SODIUM (PORCINE) 5000 UNIT/ML IJ SOLN
5000.0000 [IU] | Freq: Three times a day (TID) | INTRAMUSCULAR | Status: DC
  Administered 2013-10-12 – 2013-10-17 (×14): 5000 [IU] via SUBCUTANEOUS
  Filled 2013-10-12 (×17): qty 1

## 2013-10-12 MED ORDER — MORPHINE SULFATE 2 MG/ML IJ SOLN
1.0000 mg | INTRAMUSCULAR | Status: DC | PRN
  Administered 2013-10-12: 1 mg via INTRAVENOUS
  Filled 2013-10-12: qty 1

## 2013-10-12 NOTE — ED Notes (Signed)
Pt presents with NAD- sent from urology office for evaluation. Pt c/o of nausea x 3 weeks. Denies vomiting yet feels as if he could. Denies fever. " just does not feel well" Pt c/o of generalized stomach pain

## 2013-10-12 NOTE — H&P (Addendum)
[name] [name] is an 80 y.o. male.   PCP:   [name] Fillers, MD   Chief Complaint:  Pancreatitis.  HPI: 88 Male c prior cholecystectomy, Non Drinker and prior pancreatitis presented to our office 4/8 c 3 wks of N/V/Wt loss and constipation.  Labs were obtained showing Pancreatitis.  Lbs only became available today.  He was seeing Dr [name] [name] today for his [facility] and baseline cr 1.5.  Dr [name] [name] called me and said the pt was bloated, uncomfortable and wanted me to set up his CT ab and see him quickly.  However since pt was 72 and lived 37 miles away and has clinically worsened we decided to send to ED. W/up showed uncomplicated Pancreatitis and worsened lab parameters c more azotemia.  I was called to admit, eval and rx.  Placed on Tele due to prior cardiac issues.  Bowel rest and IVF started.  Will have Dr [name] [name] see in am and decide on GI eval or MRCP/ERCP needs.      Past Medical History:  Past Medical History  Diagnosis Date  . Osteoarthritis   . Gout   . Hypertension   . History of coronary artery disease   . GERD (gastroesophageal reflux disease)   . CHF (congestive heart failure)   . Diastolic dysfunction   . History of GI bleed   . History of pacemaker     Medtronic Adapta ADDro1  . Peptic ulcer disease   . BPH (benign prostatic hypertrophy)   . 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 Hyperlipidemia   . History of cerebrovascular disease   . Kidney stone   . Depression   . Chest pain     1975 Pelvis Fracture Gastritis and GERD HTN BPH Hyperlipidemia 2000 Colon Polyps 1976 Remote Right DVT associated with surgery Nephrolithiasis C-spine DDD Bilateral Macular Degeneration (right worse than left) Gout 2013 VHD with mild aortic stenosis, mild MR, mild TR, biatrial enlargement, LV Efx 65% 2008 Cardiac arrest at [facility] , and pacemaker placement at [facility], CAD (100% AV paced) 5/07 Cardiac Cath showed less than 50 % stenosis CAD, normal LV  systolic function 2011 Chronic Kidney disease, stage 3, with microalbuminuria 2009 Carotid Ultrasound showed:  60-79 % LICA and 40-59 % RICA stenosis (Crenshaw), 1/14 carotid US planned 2/09 Hospital Admit for idiopathic pancreatitis with followup CT abdomen negative 7/14 hospital admission for congestive heart failure from diastolic dysfunction  Physicians involved in care:  Groat (oph), Appllington (ortho), Dorena Cookey (GI), Annabell Howells (urol), Jens Som and Le Center (cards), Hardcastle (gsurg),    (chiropracter), Elvis Coil (nephrology)  Past Surgical History  Procedure Laterality Date  . Pacemaker insertion      Medtronic Adapta ADDro1  . Cholecystectomy    . Total knee arthroplasty    . Hernia repair    . Tonsillectomy    . Total knee arthroplasty  08/28/2011    Procedure: TOTAL KNEE ARTHROPLASTY;  Surgeon: Drucilla Schmidt, MD;  Location: [facility];  Service: Orthopedics;  Laterality: Left;   Surgical History (reviewed - no changes required): 1975 Pelvis Fracture (associated with DVT) 1993 Right Knee Arthroscopy 1994, 1998 Bilateral Cataracts 1997 Lap Chole Aug 2000 Right Total Knee Replacement 2002 Left Inguinal Hernia repair, and partial colectomy 2002 Left Knee Arthroscopy 2003 Blepharoplasty 2003 TURP 2005 Colonoscopy showed:  hyperplastic polyp 5/07 Cardiac Cath without significant CAD (less than 50 % stenoses), normal LV systolic function 10/08 Cardiac Pacemaker implantation because of sinus arrest AV node  dysfunction (at [facility]) 9/10 Colonoscopy planned with results? 2/13 Left Total Knee Replacement by Dr. [name]   Allergies:  No Known Allergies   Medications: Prior to Admission medications   Medication Sig Start Date End Date Taking? Authorizing Provider  allopurinol (ZYLOPRIM) 300 MG tablet Take 300 mg by mouth daily.    Yes Historical Provider, MD  aspirin 81 MG tablet Take 81 mg by mouth every morning.    Yes Historical Provider, MD  atorvastatin (LIPITOR) 80 MG  tablet Take 40 mg by mouth at bedtime.    Yes Historical Provider, MD  Cholecalciferol (VITAMIN D-3 PO) Take 1,000 Units by mouth daily.   Yes Historical Provider, MD  finasteride (PROSCAR) 5 MG tablet Take 5 mg by mouth daily.    Yes Historical Provider, MD  furosemide (LASIX) 40 MG tablet TAKE ONE TABLET BY MOUTH ONCE DAILY 10/01/13  Yes Duke Salvia, MD  losartan (COZAAR) 25 MG tablet Take 1 tablet (25 mg total) by mouth daily. 05/18/13  Yes Duke Salvia, MD  metoprolol (LOPRESSOR) 50 MG tablet TAKE ONE-HALF TABLET BY MOUTH TWICE DAILY 06/28/13  Yes Lewayne Bunting, MD  Multiple Vitamins-Minerals (OCUVITE EXTRA) TABS Take 2 tablets by mouth 2 (two) times daily.    Yes Historical Provider, MD  pantoprazole (PROTONIX) 40 MG tablet Take 40 mg by mouth 2 (two) times daily.    Yes Historical Provider, MD  polyethylene glycol powder (GLYCOLAX/MIRALAX) powder Take 17 g by mouth daily as needed (for constipation).   Yes Historical Provider, MD  sertraline (ZOLOFT) 100 MG tablet Take 100 mg by mouth every morning.   Yes Historical Provider, MD     Medications Prior to Admission  Medication Sig Dispense Refill  . allopurinol (ZYLOPRIM) 300 MG tablet Take 300 mg by mouth daily.       Marland Kitchen aspirin 81 MG tablet Take 81 mg by mouth every morning.       Marland Kitchen atorvastatin (LIPITOR) 80 MG tablet Take 40 mg by mouth at bedtime.       . Cholecalciferol (VITAMIN D-3 PO) Take 1,000 Units by mouth daily.      . finasteride (PROSCAR) 5 MG tablet Take 5 mg by mouth daily.       . furosemide (LASIX) 40 MG tablet TAKE ONE TABLET BY MOUTH ONCE DAILY  30 tablet  0  . losartan (COZAAR) 25 MG tablet Take 1 tablet (25 mg total) by mouth daily.  90 tablet  3  . metoprolol (LOPRESSOR) 50 MG tablet TAKE ONE-HALF TABLET BY MOUTH TWICE DAILY  60 tablet  6  . Multiple Vitamins-Minerals (OCUVITE EXTRA) TABS Take 2 tablets by mouth 2 (two) times daily.       . pantoprazole (PROTONIX) 40 MG tablet Take 40 mg by mouth 2 (two) times  daily.       . polyethylene glycol powder (GLYCOLAX/MIRALAX) powder Take 17 g by mouth daily as needed (for constipation).      . sertraline (ZOLOFT) 100 MG tablet Take 100 mg by mouth every morning.         Social History:  reports that he has quit smoking. He quit smokeless tobacco use about 37 years ago. He reports that he does not drink alcohol or use illicit drugs. Social History (reviewed - no changes required): MARRIED (his wife has advanced Alzheimer's disease and is currently a resident in a nursing home nearby to his home). RETIRED FUNERAL DIRECTOR. SMOKED 1/2 PPD X 15 YRS STOPPED IN 1970'S. NO  ALCOHOL. A daughter was born in 11/58 and a son was born in 11/55.  Family History: Family History  Problem Relation Age of Onset  . Diabetes Mother   . Leukemia Father    Family History (reviewed - no changes required): FATHER DECEASED AGE 24 LEUKEMIA.  MOTHER DECEASED AGE 88 CVA/DM.  1 SISTER AGE 36 HTN.    There is a family history of DM2 (mother), and no history of CAD, colon or breast CA.   Review of Systems:  Review of Systems - Full ROS obtained (-) CP no SOB Last BM Yesterday. Ab is tender but no sig pains.    Physical Exam:  Blood pressure 160/65, pulse 66, temperature 97.6 F (36.4 C), temperature source Oral, resp. rate 12, height 5\' 8"  (1.727 m), weight 81.647 kg (180 lb), SpO2 100.00%. Filed Vitals:   10/12/13 1117 10/12/13 1402  BP: 147/54 160/65  Pulse: 64 66  Temp: 97.8 F (36.6 C) 97.6 F (36.4 C)  TempSrc: Oral Oral  Resp: 16 12  Height: 5\' 8"  (1.727 m)   Weight: 81.647 kg (180 lb)   SpO2: 100% 100%   General appearance: looks younger than stated age Head: Normocephalic, without obvious abnormality, atraumatic Eyes: conjunctivae/corneas clear. PERRL, EOM's intact.  Nose: Nares normal. Septum midline. Mucosa normal. No drainage or sinus tenderness. Throat: lips, mucosa, and tongue normal; teeth and gums normal Neck: no adenopathy, no carotid bruit,  no JVD and thyroid not enlarged, symmetric, no tenderness/mass/nodules Resp: CTA Cardio: Reg GI: bloated, distended, mild diffuse tender. Extremities: extremities normal, atraumatic, no cyanosis or edema Pulses: 2+ and symmetric Lymph nodes: no cervical lymphadenopathy Neurologic: Alert and oriented X 3, normal strength and tone. Normal symmetric reflexes.     Labs on Admission:   Recent Labs  10/12/13 1152 10/12/13 1200  NA 140 140  K 5.0 4.8  CL 99 101  CO2 28  --   GLUCOSE 99 96  BUN 46* 43*  CREATININE 1.80* 1.90*  CALCIUM 9.4  --     Recent Labs  10/12/13 1152  AST 48*  ALT 29  ALKPHOS 447*  BILITOT 0.4  PROT 7.4  ALBUMIN 3.5    Recent Labs  10/12/13 1152  LIPASE 2051*    Recent Labs  10/12/13 1152 10/12/13 1200  WBC 7.2  --   NEUTROABS 4.9  --   HGB 12.4* 13.6  HCT 36.4* 40.0  MCV 100.8*  --   PLT 219  --     Recent Labs  10/12/13 1152  TROPONINI <0.30   Lab Results  Component Value Date   INR 1.06 01/15/2013   INR 0.96 08/14/2011   INR 1.0 08/18/2007     LAB RESULT POCT:  Results for orders placed during the hospital encounter of 10/12/13  CBC WITH DIFFERENTIAL      Result Value Ref Range   WBC 7.2  4.0 - 10.5 K/uL   RBC 3.61 (*) 4.22 - 5.81 MIL/uL   Hemoglobin 12.4 (*) 13.0 - 17.0 g/dL   HCT 60.4 (*) 54.0 - 98.1 %   MCV 100.8 (*) 78.0 - 100.0 fL   MCH 34.3 (*) 26.0 - 34.0 pg   MCHC 34.1  30.0 - 36.0 g/dL   RDW 19.1  47.8 - 29.5 %   Platelets 219  150 - 400 K/uL   Neutrophils Relative % 69  43 - 77 %   Neutro Abs 4.9  1.7 - 7.7 K/uL   Lymphocytes Relative 17  12 -  46 %   Lymphs Abs 1.2  0.7 - 4.0 K/uL   Monocytes Relative 7  3 - 12 %   Monocytes Absolute 0.5  0.1 - 1.0 K/uL   Eosinophils Relative 7 (*) 0 - 5 %   Eosinophils Absolute 0.5  0.0 - 0.7 K/uL   Basophils Relative 0  0 - 1 %   Basophils Absolute 0.0  0.0 - 0.1 K/uL  COMPREHENSIVE METABOLIC PANEL      Result Value Ref Range   Sodium 140  137 - 147 mEq/L    Potassium 5.0  3.7 - 5.3 mEq/L   Chloride 99  96 - 112 mEq/L   CO2 28  19 - 32 mEq/L   Glucose, Bld 99  70 - 99 mg/dL   BUN 46 (*) 6 - 23 mg/dL   Creatinine, Ser 1.611.80 (*) 0.50 - 1.35 mg/dL   Calcium 9.4  8.4 - 09.610.5 mg/dL   Total Protein 7.4  6.0 - 8.3 g/dL   Albumin 3.5  3.5 - 5.2 g/dL   AST 48 (*) 0 - 37 U/L   ALT 29  0 - 53 U/L   Alkaline Phosphatase 447 (*) 39 - 117 U/L   Total Bilirubin 0.4  0.3 - 1.2 mg/dL   GFR calc non Af Amer 32 (*) >90 mL/min   GFR calc Af Amer 37 (*) >90 mL/min  LIPASE, BLOOD      Result Value Ref Range   Lipase 2051 (*) 11 - 59 U/L  TROPONIN I      Result Value Ref Range   Troponin I <0.30  <0.30 ng/mL  URINALYSIS, ROUTINE W REFLEX MICROSCOPIC      Result Value Ref Range   Color, Urine YELLOW  YELLOW   APPearance CLEAR  CLEAR   Specific Gravity, Urine 1.015  1.005 - 1.030   pH 5.0  5.0 - 8.0   Glucose, UA NEGATIVE  NEGATIVE mg/dL   Hgb urine dipstick NEGATIVE  NEGATIVE   Bilirubin Urine NEGATIVE  NEGATIVE   Ketones, ur NEGATIVE  NEGATIVE mg/dL   Protein, ur NEGATIVE  NEGATIVE mg/dL   Urobilinogen, UA 0.2  0.0 - 1.0 mg/dL   Nitrite NEGATIVE  NEGATIVE   Leukocytes, UA NEGATIVE  NEGATIVE  I-STAT CG4 LACTIC ACID, ED      Result Value Ref Range   Lactic Acid, Venous 1.11  0.5 - 2.2 mmol/L  I-STAT CHEM 8, ED      Result Value Ref Range   Sodium 140  137 - 147 mEq/L   Potassium 4.8  3.7 - 5.3 mEq/L   Chloride 101  96 - 112 mEq/L   BUN 43 (*) 6 - 23 mg/dL   Creatinine, Ser 0.451.90 (*) 0.50 - 1.35 mg/dL   Glucose, Bld 96  70 - 99 mg/dL   Calcium, Ion 4.091.18  8.111.13 - 1.30 mmol/L   TCO2 29  0 - 100 mmol/L   Hemoglobin 13.6  13.0 - 17.0 g/dL   HCT 91.440.0  78.239.0 - 95.652.0 %      Radiological Exams on Admission: Ct Abdomen Pelvis Wo Contrast  10/12/2013   CLINICAL DATA:  Lower abdominal pain, nausea and vomiting  EXAM: CT ABDOMEN AND PELVIS WITHOUT CONTRAST  TECHNIQUE: Multidetector CT imaging of the abdomen and pelvis was performed following the standard  protocol without intravenous contrast.  COMPARISON:  CT PELVIS W/CM dated 05/18/2008  FINDINGS: Lung bases are clear.  No pericardial fluid.  No focal hepatic lesion on this  noncontrast exam. Post cholecystectomy. There is mild stranding surrounding the head and body of the pancreas. No organized fluid collections. Spleen, adrenal glands, and left kidney are normal. There is a new round intermediate density lesion extending from the lower pole of the right kidney measuring 21 mm. Benign appearing cyst extending laterally from the right kidney  The stomach, small bowel, appendix, cecum are normal. Colon rectosigmoid colon are normal  Abdominal aorta is normal caliber. No retroperitoneal periportal lymphadenopathy. No free fluid the pelvis. Bladder contains several small diverticulum diverticula. The prostate gland with bilateral fat filled inguinal hernias. No aggressive osseous lesion.  IMPRESSION: 1. Inflammation associated with the head and body of the pancreas is consists with mild acute pancreatitis. No organized fluid collections. 2. New lobular lesion extending from the lower pole right kidney is concerning for a small renal cell carcinoma. Recommend non emergent urology consultation and consider reduced volume IV contrast CT or MRI renal exam for further characterization (renal insufficiency).   Electronically Signed   By: Genevive BiStewart  Edmunds M.D.   On: 10/12/2013 15:08      Orders placed during the hospital encounter of 10/12/13  . ED EKG  . ED EKG  . EKG 12-LEAD  . EKG 12-LEAD     Assessment/Plan Active Problems:   Pancreatitis   Pancreatitis.  Sips and chips only.  Bowel rest.  IVF.  Will let Dr [name] HarmanPaterson see pt in am and discuss MRCP vrs ERCP vrs GI consult vrs conservative Rx only.  Consider Post Pyloric feeds if necessary. He is S/P Cholecystectomy and does not drink ETOH. Check TGs. Episode in 2009 was not this bad. No Pancreatic CAs seen on CT  Mild Pancreatitis induced ileus c  bloating last BM yesterday.  Check KUB in am.  Home meds where appropriate.  Probable Renal Cell CA - Can be w/up as outpt per Uro or Interventional Radiology - ? cryoablation per Dr Fredia SorrowYamagata??  CKDz c Azotemia - Fluids - Hold Lasix - watch for failure.  Statin on hold c the pancreatitis.  HTN - BP fine.  Hep SQ for DVT proph  CAD - No current Angina.  On Tele.  Gwen PoundsJohn M Lyriq Finerty 10/12/2013, 5:21 PM

## 2013-10-12 NOTE — ED Notes (Signed)
Pt valuables locked with security.  

## 2013-10-12 NOTE — Progress Notes (Signed)
Seen at our office 10/06/13 for 1-2 weeks of feeling bad c constipation and Nausea. Labs obtained and had yet to be reported came back today showing Pancreatitis. ! Amylase, Serum       [HH] 571 U/L                     31-124 ! Lipase, Serum        [H]  1943 U/L                    0-59 Pt was seeing Dr Daphine DeutscherMartin today for CKD - Cr 1.5 and Dr Daphine Deutschermartin called me and we tried to coordinate outpt CT and f/up appts. Due to difficulty coordinating this and progressive Sxs and possible Pancreatitis induced ileus he was sent to ED for further eval, Rx, labs and imaging.

## 2013-10-12 NOTE — ED Notes (Signed)
Patient transported to CT 

## 2013-10-12 NOTE — ED Provider Notes (Signed)
CSN: [id number]     Arrival date & time 10/12/13  1106 History   First MD Initiated Contact with Patient 10/12/13 1124     Chief Complaint  Patient presents with  . Abdominal Pain  . Nausea  . Chest Pain  . Shortness of Breath     (Consider location/radiation/quality/duration/timing/severity/associated sxs/prior Treatment) HPI Comments: Patient presents to the ER for evaluation of abdominal pain. Patient reports that he has been having ongoing abdominal distention and pain for approximately 3 weeks. He saw Doctor Eloise HarmanPaterson a week ago and was told that he had pancreatitis. Since then, the pain has worsened. It is more on the right side of his abdomen. He feels distended, cannot eat. If he is small amounts he feels full and nauseated. He has not been having normal bowel movements, feels constipated. He has had some radiation of the pain up into the center of his abdomen and up into the chest. He has felt short of breath at times.  Patient is a 80 y.o. male presenting with abdominal pain, chest pain, and shortness of breath.  Abdominal Pain Associated symptoms: chest pain and shortness of breath   Associated symptoms: no fever   Chest Pain Associated symptoms: abdominal pain and shortness of breath   Associated symptoms: no fever   Shortness of Breath Associated symptoms: abdominal pain and chest pain   Associated symptoms: no fever     Past Medical History  Diagnosis Date  . Osteoarthritis   . Gout   . Hypertension   . History of coronary artery disease   . GERD (gastroesophageal reflux disease)   . CHF (congestive heart failure)   . Diastolic dysfunction   . History of GI bleed   . History of pacemaker     Medtronic Adapta ADDro1  . Peptic ulcer disease   . BPH (benign prostatic hypertrophy)   . 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 Hyperlipidemia   . History of cerebrovascular disease   . Kidney stone   . Depression   . Chest pain    Past Surgical History   Procedure Laterality Date  . Pacemaker insertion      Medtronic Adapta ADDro1  . Cholecystectomy    . Total knee arthroplasty    . Hernia repair    . Tonsillectomy    . Total knee arthroplasty  08/28/2011    Procedure: TOTAL KNEE ARTHROPLASTY;  Surgeon: Drucilla SchmidtJames P Aplington, MD;  Location: [facility];  Service: Orthopedics;  Laterality: Left;   Family History  Problem Relation Age of Onset  . Diabetes Mother   . Leukemia Father    History  Substance Use Topics  . Smoking status: Former Games developermoker  . Smokeless tobacco: Former NeurosurgeonUser    Quit date: 08/13/1976  . Alcohol Use: No    Review of Systems  Constitutional: Negative for fever.  Respiratory: Positive for shortness of breath.   Cardiovascular: Positive for chest pain.  Gastrointestinal: Positive for abdominal pain.  All other systems reviewed and are negative.     Allergies  Review of patient's allergies indicates no known allergies.  Home Medications   Prior to Admission medications   Medication Sig Start Date End Date Taking? Authorizing Provider  allopurinol (ZYLOPRIM) 300 MG tablet Take 300 mg by mouth daily.     Historical Provider, MD  aspirin 81 MG tablet Take 81 mg by mouth every morning.     Historical Provider, MD  atorvastatin (LIPITOR) 80 MG tablet Take  40 mg by mouth at bedtime.     Historical Provider, MD  Cholecalciferol (VITAMIN D-3 PO) Take 1,000 Units by mouth daily.    Historical Provider, MD  finasteride (PROSCAR) 5 MG tablet Take 5 mg by mouth daily.     Historical Provider, MD  furosemide (LASIX) 40 MG tablet TAKE ONE TABLET BY MOUTH ONCE DAILY 10/01/13   Duke SalviaSteven C Klein, MD  losartan (COZAAR) 25 MG tablet Take 1 tablet (25 mg total) by mouth daily. 05/18/13   Duke SalviaSteven C Klein, MD  metoprolol (LOPRESSOR) 50 MG tablet TAKE ONE-HALF TABLET BY MOUTH TWICE DAILY 06/28/13   Lewayne BuntingBrian S Crenshaw, MD  Multiple Vitamins-Minerals [facility](OCUVITE EXTRA) TABS Take 2 tablets by mouth 2 (two) times daily.     Historical Provider, MD   pantoprazole (PROTONIX) 40 MG tablet Take 40 mg by mouth 2 (two) times daily.     Historical Provider, MD  polyethylene glycol powder (GLYCOLAX/MIRALAX) powder Take 17 g by mouth daily as needed (for constipation).    Historical Provider, MD  sertraline (ZOLOFT) 100 MG tablet Take 100 mg by mouth every morning.    Historical Provider, MD   BP 160/65  Pulse 66  Temp(Src) 97.6 F (36.4 C) (Oral)  Resp 12  Ht 5\' 8"  (1.727 m)  Wt 180 lb (81.647 kg)  BMI 27.38 kg/m2  SpO2 100% Physical Exam  Constitutional: He is oriented to person, place, and time. He appears well-developed and well-nourished. No distress.  HENT:  Head: Normocephalic and atraumatic.  Right Ear: Hearing normal.  Left Ear: Hearing normal.  Nose: Nose normal.  Mouth/Throat: Oropharynx is clear and moist and mucous membranes are normal.  Eyes: Conjunctivae and EOM are normal. Pupils are equal, round, and reactive to light.  Neck: Normal range of motion. Neck supple.  Cardiovascular: Regular rhythm, S1 normal and S2 normal.  Exam reveals no gallop and no friction rub.   No murmur heard. Pulmonary/Chest: Effort normal and breath sounds normal. No respiratory distress. He exhibits no tenderness.  Abdominal: Soft. Normal appearance. He exhibits distension. Bowel sounds are decreased. There is no hepatosplenomegaly. There is generalized tenderness (Tenderness is generalized, but more on the right side and more right lower and right upper). There is no rebound, no guarding, no tenderness at McBurney's point and negative Murphy's sign. No hernia.  Musculoskeletal: Normal range of motion.  Neurological: He is alert and oriented to person, place, and time. He has normal strength. No cranial nerve deficit or sensory deficit. Coordination normal. GCS eye subscore is 4. GCS verbal subscore is 5. GCS motor subscore is 6.  Skin: Skin is warm, dry and intact. No rash noted. No cyanosis.  Psychiatric: He has a normal mood and affect. His  speech is normal and behavior is normal. Thought content normal.    ED Course  Procedures (including critical care time) Labs Review Labs Reviewed  CBC WITH DIFFERENTIAL - Abnormal; Notable for the following:    RBC 3.61 (*)    Hemoglobin 12.4 (*)    HCT 36.4 (*)    MCV 100.8 (*)    MCH 34.3 (*)    Eosinophils Relative 7 (*)    All other components within normal limits  COMPREHENSIVE METABOLIC PANEL - Abnormal; Notable for the following:    BUN 46 (*)    Creatinine, Ser 1.80 (*)    AST 48 (*)    Alkaline Phosphatase 447 (*)    GFR calc non Af Amer 32 (*)    GFR calc  Af Amer 37 (*)    All other components within normal limits  LIPASE, BLOOD - Abnormal; Notable for the following:    Lipase 2051 (*)    All other components within normal limits  I-STAT CHEM 8, ED - Abnormal; Notable for the following:    BUN 43 (*)    Creatinine, Ser 1.90 (*)    All other components within normal limits  TROPONIN I  URINALYSIS, ROUTINE W REFLEX MICROSCOPIC  I-STAT CG4 LACTIC ACID, ED    Imaging Review Ct Abdomen Pelvis Wo Contrast  10/12/2013   CLINICAL DATA:  Lower abdominal pain, nausea and vomiting  EXAM: CT ABDOMEN AND PELVIS WITHOUT CONTRAST  TECHNIQUE: Multidetector CT imaging of the abdomen and pelvis was performed following the standard protocol without intravenous contrast.  COMPARISON:  CT PELVIS W/CM dated 05/18/2008  FINDINGS: Lung bases are clear.  No pericardial fluid.  No focal hepatic lesion on this noncontrast exam. Post cholecystectomy. There is mild stranding surrounding the head and body of the pancreas. No organized fluid collections. Spleen, adrenal glands, and left kidney are normal. There is a new round intermediate density lesion extending from the lower pole of the right kidney measuring 21 mm. Benign appearing cyst extending laterally from the right kidney  The stomach, small bowel, appendix, cecum are normal. Colon rectosigmoid colon are normal  Abdominal aorta is normal  caliber. No retroperitoneal periportal lymphadenopathy. No free fluid the pelvis. Bladder contains several small diverticulum diverticula. The prostate gland with bilateral fat filled inguinal hernias. No aggressive osseous lesion.  IMPRESSION: 1. Inflammation associated with the head and body of the pancreas is consists with mild acute pancreatitis. No organized fluid collections. 2. New lobular lesion extending from the lower pole right kidney is concerning for a small renal cell carcinoma. Recommend non emergent urology consultation and consider reduced volume IV contrast CT or MRI renal exam for further characterization (renal insufficiency).   Electronically Signed   By: Genevive Bi M.D.   On: 10/12/2013 15:08     EKG Interpretation   Date/Time:  Tuesday October 12 2013 11:35:13 EDT Ventricular Rate:  70 PR Interval:  251 QRS Duration: 156 QT Interval:  459 QTC Calculation: 495 R Axis:   -40 Text Interpretation:  Atrial-sensed ventricular-paced complexes No further  analysis attempted due to paced rhythm Confirmed by POLLINA  MD,  CHRISTOPHER [phone]) on 10/12/2013 11:46:00 AM      MDM   Final diagnoses:  Pancreatitis  Renal mass, right   Patient presents to the ER for evaluation of progressively worsening abdominal pain. Patient had blood work performed on February 8 at his primary care doctor's office and had a markedly elevated lipase. Patient has continued to have pain and his symptoms are worsening. He was referred to the ER by one of his doctors today for further evaluation. Lipase is now above 2000. No reason to start cardiac etiology for the patient's pain. A CAT scan was performed. Patient has had previous cholecystectomy. There is inflammation of the pancreas without any other acute findings. There appears to be incidental finding of lobular lesion in the right kidney there is concerning for renal cell carcinoma. Patient will require hospitalization for further management of  acute pancreatitis, outpatient workup for possible renal cell carcinoma.   Gilda Crease, MD 10/12/13 [phone]

## 2013-10-12 NOTE — Progress Notes (Signed)
   CARE MANAGEMENT ED NOTE 10/12/2013  Patient:  Pietro CassisMARTIN,Daymond E   Account Number:  [id number]  Date Initiated:  10/12/2013  Documentation initiated by:  Radford PaxFERRERO,Loralie Malta  Subjective/Objective Assessment:   Patient presents to Ed with nausea and abdominal pain.     Subjective/Objective Assessment Detail:     Action/Plan:   Action/Plan Detail:   Anticipated DC Date:       Status Recommendation to Physician:   Result of Recommendation:    Other ED Services  Consult Working Plan    DC Planning Services  Other    Choice offered to / List presented to:            Status of service:  Completed, signed off  ED Comments:   ED Comments Detail:  EDCM spoke to patient at bedside.  Patient reports he lives alone and his daughter lives right next door.  Patient has a cane, walker and shower chair at home.  Patient reports he is independent with his ADL's, and does all of his house work and cooking by himself.  Patient is also still driving.  Patient does not have any home health services at this time.  As per patient, he has had home health services with [facility] in the past.  [place]  EDCM provided patient with  a list of home health agencies in [place].  No furhter EDCM needs at this time.

## 2013-10-13 ENCOUNTER — Inpatient Hospital Stay (HOSPITAL_COMMUNITY): Payer: Medicare Other

## 2013-10-13 LAB — COMPREHENSIVE METABOLIC PANEL
ALBUMIN: 2.8 g/dL — AB (ref 3.5–5.2)
ALK PHOS: 385 U/L — AB (ref 39–117)
ALT: 28 U/L (ref 0–53)
AST: 47 U/L — ABNORMAL HIGH (ref 0–37)
BILIRUBIN TOTAL: 0.5 mg/dL (ref 0.3–1.2)
BUN: 37 mg/dL — AB (ref 6–23)
CO2: 29 meq/L (ref 19–32)
Calcium: 8.8 mg/dL (ref 8.4–10.5)
Chloride: 102 mEq/L (ref 96–112)
Creatinine, Ser: 1.57 mg/dL — ABNORMAL HIGH (ref 0.50–1.35)
GFR, EST AFRICAN AMERICAN: 44 mL/min — AB (ref >90)
GFR, EST NON AFRICAN AMERICAN: 38 mL/min — AB (ref >90)
Glucose, Bld: 89 mg/dL (ref 70–99)
POTASSIUM: 4.9 meq/L (ref 3.7–5.3)
Sodium: 140 mEq/L (ref 137–147)
Total Protein: 6.1 g/dL (ref 6.0–8.3)

## 2013-10-13 LAB — CBC
HCT: 33.4 % — ABNORMAL LOW (ref 39.0–52.0)
Hemoglobin: 11.2 g/dL — ABNORMAL LOW (ref 13.0–17.0)
MCH: 33.7 pg (ref 26.0–34.0)
MCHC: 33.5 g/dL (ref 30.0–36.0)
MCV: 100.6 fL — ABNORMAL HIGH (ref 78.0–100.0)
PLATELETS: 193 10*3/uL (ref 150–400)
RBC: 3.32 MIL/uL — AB (ref 4.22–5.81)
RDW: 13.6 % (ref 11.5–15.5)
WBC: 6 10*3/uL (ref 4.0–10.5)

## 2013-10-13 LAB — LIPASE, BLOOD: Lipase: 986 U/L — ABNORMAL HIGH (ref 11–59)

## 2013-10-13 MED ORDER — KCL IN DEXTROSE-NACL 20-5-0.9 MEQ/L-%-% IV SOLN
INTRAVENOUS | Status: DC
  Administered 2013-10-13: 75 mL/h via INTRAVENOUS
  Administered 2013-10-14: 01:00:00 via INTRAVENOUS
  Administered 2013-10-14: 75 mL/h via INTRAVENOUS
  Administered 2013-10-15: 02:00:00 via INTRAVENOUS
  Filled 2013-10-13 (×5): qty 1000

## 2013-10-13 MED ORDER — FAMOTIDINE 20 MG PO TABS
20.0000 mg | ORAL_TABLET | Freq: Every day | ORAL | Status: DC | PRN
  Administered 2013-10-15: 20 mg via ORAL
  Filled 2013-10-13 (×2): qty 1

## 2013-10-13 MED ORDER — ALUM & MAG HYDROXIDE-SIMETH 200-200-20 MG/5ML PO SUSP
30.0000 mL | ORAL | Status: DC | PRN
  Administered 2013-10-13: 30 mL via ORAL
  Filled 2013-10-13: qty 30

## 2013-10-13 NOTE — Progress Notes (Signed)
Subjective: He feels better this morning with less epigastric pain and only very mild nausea.  He is not having any shortness of breath or chest discomfort as well.  Objective: Vital signs in last 24 hours: Temp:  [97.6 F (36.4 C)-98.2 F (36.8 C)] 98.2 F (36.8 C) (04/15 0454) Pulse Rate:  [66-82] 80 (04/15 0800) Resp:  [12-18] 18 (04/15 0800) BP: (139-163)/(51-65) 143/55 mmHg (04/15 0454) SpO2:  [97 %-100 %] 97 % (04/15 0454) Weight:  [81.647 kg (180 lb)-81.784 kg (180 lb 4.8 oz)] 81.784 kg (180 lb 4.8 oz) (04/15 0454) Weight change:    Intake/Output from previous day: 04/14 0701 - 04/15 0700 In: 961.3 [I.V.:961.3] Out: 400 [Urine:400]   General appearance: alert, cooperative and no distress Resp: clear to auscultation bilaterally Cardio: regular rate and rhythm, S1, S2 normal, no murmur, click, rub or gallop GI: bbowel sounds were normal and there was no significant abdominal pain Extremities: extremities normal, atraumatic, no cyanosis or edema  Lab Results:  Recent Labs  10/12/13 1152 10/12/13 1200 10/13/13 0352  WBC 7.2  --  6.0  HGB 12.4* 13.6 11.2*  HCT 36.4* 40.0 33.4*  PLT 219  --  193   BMET  Recent Labs  10/12/13 1152 10/12/13 1200 10/13/13 0352  NA 140 140 140  K 5.0 4.8 4.9  CL 99 101 102  CO2 28  --  29  GLUCOSE 99 96 89  BUN 46* 43* 37*  CREATININE 1.80* 1.90* 1.57*  CALCIUM 9.4  --  8.8   CMET CMP     Component Value Date/Time   NA 140 10/13/2013 0352   K 4.9 10/13/2013 0352   CL 102 10/13/2013 0352   CO2 29 10/13/2013 0352   GLUCOSE 89 10/13/2013 0352   BUN 37* 10/13/2013 0352   CREATININE 1.57* 10/13/2013 0352   CALCIUM 8.8 10/13/2013 0352   PROT 6.1 10/13/2013 0352   ALBUMIN 2.8* 10/13/2013 0352   AST 47* 10/13/2013 0352   ALT 28 10/13/2013 0352   ALKPHOS 385* 10/13/2013 0352   BILITOT 0.5 10/13/2013 0352   GFRNONAA 38* 10/13/2013 0352   GFRAA 44* 10/13/2013 0352    CBG (last 3)   Recent Labs  10/12/13 2154  GLUCAP 85    INR  RESULTS:   Lab Results  Component Value Date   INR 1.06 01/15/2013   INR 0.96 08/14/2011   INR 1.0 08/18/2007     Studies/Results: Ct Abdomen Pelvis Wo Contrast  10/12/2013   CLINICAL DATA:  Lower abdominal pain, nausea and vomiting  EXAM: CT ABDOMEN AND PELVIS WITHOUT CONTRAST  TECHNIQUE: Multidetector CT imaging of the abdomen and pelvis was performed following the standard protocol without intravenous contrast.  COMPARISON:  CT PELVIS W/CM dated 05/18/2008  FINDINGS: Lung bases are clear.  No pericardial fluid.  No focal hepatic lesion on this noncontrast exam. Post cholecystectomy. There is mild stranding surrounding the head and body of the pancreas. No organized fluid collections. Spleen, adrenal glands, and left kidney are normal. There is a new round intermediate density lesion extending from the lower pole of the right kidney measuring 21 mm. Benign appearing cyst extending laterally from the right kidney  The stomach, small bowel, appendix, cecum are normal. Colon rectosigmoid colon are normal  Abdominal aorta is normal caliber. No retroperitoneal periportal lymphadenopathy. No free fluid the pelvis. Bladder contains several small diverticulum diverticula. The prostate gland with bilateral fat filled inguinal hernias. No aggressive osseous lesion.  IMPRESSION: 1. Inflammation associated with the  head and body of the pancreas is consists with mild acute pancreatitis. No organized fluid collections. 2. New lobular lesion extending from the lower pole right kidney is concerning for a small renal cell carcinoma. Recommend non emergent urology consultation and consider reduced volume IV contrast CT or MRI renal exam for further characterization (renal insufficiency).   Electronically Signed   By: Genevive BiStewart  Edmunds M.D.   On: 10/12/2013 15:08   Dg Abd Portable 2v  10/13/2013   CLINICAL DATA:  Abdominal pain.  Evaluate for an ileus.  EXAM: PORTABLE ABDOMEN - 1 VIEW  COMPARISON:  CT ABD/PELV WO CM dated  10/12/2013  FINDINGS: The oral contrast has advanced into the colon. Contrast is predominantly in the right colon and transverse colon. There are cholecystectomy clips in the right upper abdomen. Few gas-filled loops of small bowel but no evidence to suggest obstruction. Marked degenerative changes at the pubic symphysis. Sclerosis at the femoral heads, left side greater than right. Findings are likely degenerative in etiology. Left lateral decubitus images were obtained and there is no evidence for free air.  IMPRESSION: Oral contrast has progressed into the colon and there is no evidence to suggest a bowel obstruction or ileus.   Electronically Signed   By: Richarda OverlieAdam  Henn M.D.   On: 10/13/2013 07:56    Medications: I have reviewed the patient's current medications.  Assessment/Plan: #1 Acute Pancreatitis: Interval improvement in symptoms and pancreatic enzymes with bowel rest and IV fluids. #2 Acute Renal Insufficiency: Improved with IV fluids.  We'll change his IV fluids to be D5 normal saline with 20 mEq of potassium chloride per liter and recheck a basic mild metabolic panel tomorrow.  #3 CAD: stable on current medications with no angina symptoms.  LOS: 1 day   Jarome MatinDaniel Diana Armijo 10/13/2013, 12:21 PM

## 2013-10-13 NOTE — Care Management Note (Signed)
    Page 1 of 1   10/13/2013     3:40:15 PM   CARE MANAGEMENT NOTE 10/13/2013  Patient:  Pietro CassisMARTIN,Solon E   Account Number:  [id number]  Date Initiated:  10/13/2013  Documentation initiated by:  Lanier ClamMAHABIR,Antonieta Slaven  Subjective/Objective Assessment:   80 Y/O M ADMITTED W/ABD PAIN.PANCREATITIS.     Action/Plan:   FROM HOME.HAS CANE,RW.USED [facility] IN PAST.   Anticipated DC Date:  10/18/2013   Anticipated DC Plan:  HOME/SELF CARE      DC Planning Services  CM consult      Choice offered to / List presented to:             Status of service:  In process, will continue to follow Medicare Important Message given?   (If response is "NO", the following Medicare IM given date fields will be blank) Date Medicare IM given:   Date Additional Medicare IM given:    Discharge Disposition:    Per UR Regulation:  Reviewed for med. necessity/level of care/duration of stay  If discussed at Long Length of Stay Meetings, dates discussed:    Comments:  10/13/13 Linsie Lupo RN,BSN NCM [phone] NO ANTICIPATED D/C NEEDS.

## 2013-10-14 LAB — COMPREHENSIVE METABOLIC PANEL
ALT: 27 U/L (ref 0–53)
AST: 42 U/L — AB (ref 0–37)
Albumin: 2.8 g/dL — ABNORMAL LOW (ref 3.5–5.2)
Alkaline Phosphatase: 388 U/L — ABNORMAL HIGH (ref 39–117)
BUN: 28 mg/dL — ABNORMAL HIGH (ref 6–23)
CALCIUM: 8.8 mg/dL (ref 8.4–10.5)
CO2: 30 meq/L (ref 19–32)
CREATININE: 1.41 mg/dL — AB (ref 0.50–1.35)
Chloride: 102 mEq/L (ref 96–112)
GFR calc non Af Amer: 43 mL/min — ABNORMAL LOW (ref >90)
GFR, EST AFRICAN AMERICAN: 50 mL/min — AB (ref >90)
Glucose, Bld: 125 mg/dL — ABNORMAL HIGH (ref 70–99)
Potassium: 4.8 mEq/L (ref 3.7–5.3)
Sodium: 140 mEq/L (ref 137–147)
TOTAL PROTEIN: 6.3 g/dL (ref 6.0–8.3)
Total Bilirubin: 0.5 mg/dL (ref 0.3–1.2)

## 2013-10-14 LAB — CBC
HEMATOCRIT: 34.2 % — AB (ref 39.0–52.0)
HEMOGLOBIN: 11.3 g/dL — AB (ref 13.0–17.0)
MCH: 33.9 pg (ref 26.0–34.0)
MCHC: 33 g/dL (ref 30.0–36.0)
MCV: 102.7 fL — ABNORMAL HIGH (ref 78.0–100.0)
Platelets: 191 10*3/uL (ref 150–400)
RBC: 3.33 MIL/uL — ABNORMAL LOW (ref 4.22–5.81)
RDW: 13.7 % (ref 11.5–15.5)
WBC: 5.1 10*3/uL (ref 4.0–10.5)

## 2013-10-14 LAB — LIPASE, BLOOD: LIPASE: 1260 U/L — AB (ref 11–59)

## 2013-10-14 NOTE — Plan of Care (Signed)
Problem: Phase I Progression Outcomes Goal: Initial discharge plan identified Outcome: Completed/Met Date Met:  10/14/13 home

## 2013-10-14 NOTE — Progress Notes (Signed)
... Subjective: Feels better with resolution of abdominal pain and nausea  Objective: Vital signs in last 24 hours: Temp:  [97.6 F (36.4 C)-98.4 F (36.9 C)] 97.6 F (36.4 C) (04/16 0422) Pulse Rate:  [60-80] 62 (04/16 0422) Resp:  [16-20] 16 (04/16 0422) BP: (130-133)/(44-54) 133/47 mmHg (04/16 0422) SpO2:  [97 %-100 %] 98 % (04/16 0422) Weight:  [83.2 kg (183 lb 6.8 oz)] 83.2 kg (183 lb 6.8 oz) (04/16 0422) Weight change: 1.552 kg (3 lb 6.8 oz)   Intake/Output from previous day: 04/15 0701 - 04/16 0700 In: 1933.8 [P.O.:60; I.V.:1873.8] Out: 1825 [Urine:1825]   General appearance: alert, cooperative and no distress Resp: clear to auscultation bilaterally Cardio: regular rate and rhythm GI: soft, non-tender; bowel sounds normal; no masses,  no organomegaly Extremities: extremities normal, atraumatic, no cyanosis or edema  Lab Results:  Recent Labs  10/13/13 0352 10/14/13 0345  WBC 6.0 5.1  HGB 11.2* 11.3*  HCT 33.4* 34.2*  PLT 193 191   BMET  Recent Labs  10/13/13 0352 10/14/13 0345  NA 140 140  K 4.9 4.8  CL 102 102  CO2 29 30  GLUCOSE 89 125*  BUN 37* 28*  CREATININE 1.57* 1.41*  CALCIUM 8.8 8.8   CMET CMP     Component Value Date/Time   NA 140 10/14/2013 0345   K 4.8 10/14/2013 0345   CL 102 10/14/2013 0345   CO2 30 10/14/2013 0345   GLUCOSE 125* 10/14/2013 0345   BUN 28* 10/14/2013 0345   CREATININE 1.41* 10/14/2013 0345   CALCIUM 8.8 10/14/2013 0345   PROT 6.3 10/14/2013 0345   ALBUMIN 2.8* 10/14/2013 0345   AST 42* 10/14/2013 0345   ALT 27 10/14/2013 0345   ALKPHOS 388* 10/14/2013 0345   BILITOT 0.5 10/14/2013 0345   GFRNONAA 43* 10/14/2013 0345   GFRAA 50* 10/14/2013 0345    CBG (last 3)   Recent Labs  10/12/13 2154  GLUCAP 85    INR RESULTS:   Lab Results  Component Value Date   INR 1.06 01/15/2013   INR 0.96 08/14/2011   INR 1.0 08/18/2007     Studies/Results: Ct Abdomen Pelvis Wo Contrast  10/12/2013   CLINICAL DATA:  Lower  abdominal pain, nausea and vomiting  EXAM: CT ABDOMEN AND PELVIS WITHOUT CONTRAST  TECHNIQUE: Multidetector CT imaging of the abdomen and pelvis was performed following the standard protocol without intravenous contrast.  COMPARISON:  CT PELVIS W/CM dated 05/18/2008  FINDINGS: Lung bases are clear.  No pericardial fluid.  No focal hepatic lesion on this noncontrast exam. Post cholecystectomy. There is mild stranding surrounding the head and body of the pancreas. No organized fluid collections. Spleen, adrenal glands, and left kidney are normal. There is a new round intermediate density lesion extending from the lower pole of the right kidney measuring 21 mm. Benign appearing cyst extending laterally from the right kidney  The stomach, small bowel, appendix, cecum are normal. Colon rectosigmoid colon are normal  Abdominal aorta is normal caliber. No retroperitoneal periportal lymphadenopathy. No free fluid the pelvis. Bladder contains several small diverticulum diverticula. The prostate gland with bilateral fat filled inguinal hernias. No aggressive osseous lesion.  IMPRESSION: 1. Inflammation associated with the head and body of the pancreas is consists with mild acute pancreatitis. No organized fluid collections. 2. New lobular lesion extending from the lower pole right kidney is concerning for a small renal cell carcinoma. Recommend non emergent urology consultation and consider reduced volume IV contrast CT or MRI  renal exam for further characterization (renal insufficiency).   Electronically Signed   By: Genevive BiStewart  Edmunds M.D.   On: 10/12/2013 15:08   Dg Abd Portable 2v  10/13/2013   CLINICAL DATA:  Abdominal pain.  Evaluate for an ileus.  EXAM: PORTABLE ABDOMEN - 1 VIEW  COMPARISON:  CT ABD/PELV WO CM dated 10/12/2013  FINDINGS: The oral contrast has advanced into the colon. Contrast is predominantly in the right colon and transverse colon. There are cholecystectomy clips in the right upper abdomen. Few  gas-filled loops of small bowel but no evidence to suggest obstruction. Marked degenerative changes at the pubic symphysis. Sclerosis at the femoral heads, left side greater than right. Findings are likely degenerative in etiology. Left lateral decubitus images were obtained and there is no evidence for free air.  IMPRESSION: Oral contrast has progressed into the colon and there is no evidence to suggest a bowel obstruction or ileus.   Electronically Signed   By: Richarda OverlieAdam  Henn M.D.   On: 10/13/2013 07:56    Medications: I have reviewed the patient's current medications.  Assessment/Plan: #1 Acute Pancreatitis: clinically improved, but lipase too high. Will restart feeding when lipase decreases significantly. #2 Acute Renal Injury: improved serum creatinine with IVF #3 Anemia: stable   LOS: 2 days   [name] [name] 10/14/2013, 7:33 AM

## 2013-10-15 LAB — CBC
HCT: 32.6 % — ABNORMAL LOW (ref 39.0–52.0)
Hemoglobin: 10.9 g/dL — ABNORMAL LOW (ref 13.0–17.0)
MCH: 33.7 pg (ref 26.0–34.0)
MCHC: 33.4 g/dL (ref 30.0–36.0)
MCV: 100.9 fL — ABNORMAL HIGH (ref 78.0–100.0)
PLATELETS: 208 10*3/uL (ref 150–400)
RBC: 3.23 MIL/uL — ABNORMAL LOW (ref 4.22–5.81)
RDW: 13.4 % (ref 11.5–15.5)
WBC: 4.7 10*3/uL (ref 4.0–10.5)

## 2013-10-15 LAB — COMPREHENSIVE METABOLIC PANEL
ALT: 23 U/L (ref 0–53)
AST: 40 U/L — AB (ref 0–37)
Albumin: 2.6 g/dL — ABNORMAL LOW (ref 3.5–5.2)
Alkaline Phosphatase: 358 U/L — ABNORMAL HIGH (ref 39–117)
BILIRUBIN TOTAL: 0.4 mg/dL (ref 0.3–1.2)
BUN: 19 mg/dL (ref 6–23)
CO2: 29 meq/L (ref 19–32)
Calcium: 8.6 mg/dL (ref 8.4–10.5)
Chloride: 105 mEq/L (ref 96–112)
Creatinine, Ser: 1.34 mg/dL (ref 0.50–1.35)
GFR calc Af Amer: 53 mL/min — ABNORMAL LOW (ref >90)
GFR, EST NON AFRICAN AMERICAN: 46 mL/min — AB (ref >90)
Glucose, Bld: 107 mg/dL — ABNORMAL HIGH (ref 70–99)
Potassium: 4.6 mEq/L (ref 3.7–5.3)
SODIUM: 141 meq/L (ref 137–147)
Total Protein: 5.9 g/dL — ABNORMAL LOW (ref 6.0–8.3)

## 2013-10-15 MED ORDER — POTASSIUM CHLORIDE IN NACL 20-0.9 MEQ/L-% IV SOLN
INTRAVENOUS | Status: DC
  Administered 2013-10-15 – 2013-10-16 (×3): via INTRAVENOUS
  Filled 2013-10-15 (×3): qty 1000

## 2013-10-15 MED ORDER — SENNOSIDES-DOCUSATE SODIUM 8.6-50 MG PO TABS
1.0000 | ORAL_TABLET | Freq: Once | ORAL | Status: AC
  Administered 2013-10-15: 1 via ORAL
  Filled 2013-10-15: qty 1

## 2013-10-15 MED ORDER — POLYETHYLENE GLYCOL 3350 17 G PO PACK
17.0000 g | PACK | Freq: Every day | ORAL | Status: DC
  Administered 2013-10-15 – 2013-10-17 (×3): 17 g via ORAL
  Filled 2013-10-15 (×3): qty 1

## 2013-10-15 NOTE — Progress Notes (Signed)
Subjective: No nausea or abdominal pain or dyspnea  Objective: Vital signs in last 24 hours: Temp:  [98.1 F (36.7 C)-98.6 F (37 C)] 98.6 F (37 C) (04/16 2203) Pulse Rate:  [61-67] 67 (04/16 2203) Resp:  [18-20] 20 (04/16 2203) BP: (141-148)/(50-58) 148/50 mmHg (04/16 2203) SpO2:  [97 %-98 %] 97 % (04/16 2203) Weight:  [83.2 kg (183 lb 6.8 oz)] 83.2 kg (183 lb 6.8 oz) (04/17 0500) Weight change: 0 kg (0 lb)   Intake/Output from previous day: 04/16 0701 - 04/17 0700 In: 1725 [I.V.:1725] Out: 1575 [Urine:1575]   General appearance: alert, cooperative and no distress Resp: clear to auscultation bilaterally GI: soft, non-tender; bowel sounds normal; no masses,  no organomegaly Extremities: extremities normal, atraumatic, no cyanosis or edema  Lab Results:  Recent Labs  10/14/13 0345 10/15/13 0405  WBC 5.1 4.7  HGB 11.3* 10.9*  HCT 34.2* 32.6*  PLT 191 208   BMET  Recent Labs  10/14/13 0345 10/15/13 0405  NA 140 141  K 4.8 4.6  CL 102 105  CO2 30 29  GLUCOSE 125* 107*  BUN 28* 19  CREATININE 1.41* 1.34  CALCIUM 8.8 8.6   CMET CMP     Component Value Date/Time   NA 141 10/15/2013 0405   K 4.6 10/15/2013 0405   CL 105 10/15/2013 0405   CO2 29 10/15/2013 0405   GLUCOSE 107* 10/15/2013 0405   BUN 19 10/15/2013 0405   CREATININE 1.34 10/15/2013 0405   CALCIUM 8.6 10/15/2013 0405   PROT 5.9* 10/15/2013 0405   ALBUMIN 2.6* 10/15/2013 0405   AST 40* 10/15/2013 0405   ALT 23 10/15/2013 0405   ALKPHOS 358* 10/15/2013 0405   BILITOT 0.4 10/15/2013 0405   GFRNONAA 46* 10/15/2013 0405   GFRAA 53* 10/15/2013 0405    CBG (last 3)   Recent Labs  10/12/13 2154  GLUCAP 85    INR RESULTS:   Lab Results  Component Value Date   INR 1.06 01/15/2013   INR 0.96 08/14/2011   INR 1.0 08/18/2007     Studies/Results: No results found.  Medications: I have reviewed the patient's current medications.  Assessment/Plan: #1 Pancreatitis: improved, will start clear liquids  today and advance as tolerated, recheck labs with lipase in the am. I expect that he will be discharged in 24-48 hours #2 Acute Renal Insufficiency: improved with IVF #3 HTN: stable    LOS: 3 days   [name] [name] 10/15/2013, 7:49 AM

## 2013-10-16 LAB — COMPREHENSIVE METABOLIC PANEL
ALBUMIN: 2.7 g/dL — AB (ref 3.5–5.2)
ALT: 29 U/L (ref 0–53)
AST: 52 U/L — AB (ref 0–37)
Alkaline Phosphatase: 369 U/L — ABNORMAL HIGH (ref 39–117)
BUN: 17 mg/dL (ref 6–23)
CALCIUM: 8.6 mg/dL (ref 8.4–10.5)
CO2: 27 mEq/L (ref 19–32)
Chloride: 104 mEq/L (ref 96–112)
Creatinine, Ser: 1.32 mg/dL (ref 0.50–1.35)
GFR calc non Af Amer: 47 mL/min — ABNORMAL LOW (ref >90)
GFR, EST AFRICAN AMERICAN: 54 mL/min — AB (ref >90)
Glucose, Bld: 91 mg/dL (ref 70–99)
POTASSIUM: 4.9 meq/L (ref 3.7–5.3)
Sodium: 140 mEq/L (ref 137–147)
TOTAL PROTEIN: 6 g/dL (ref 6.0–8.3)
Total Bilirubin: 0.5 mg/dL (ref 0.3–1.2)

## 2013-10-16 LAB — CBC
HEMATOCRIT: 31.2 % — AB (ref 39.0–52.0)
HEMOGLOBIN: 10.5 g/dL — AB (ref 13.0–17.0)
MCH: 33.8 pg (ref 26.0–34.0)
MCHC: 33.7 g/dL (ref 30.0–36.0)
MCV: 100.3 fL — AB (ref 78.0–100.0)
Platelets: 194 10*3/uL (ref 150–400)
RBC: 3.11 MIL/uL — ABNORMAL LOW (ref 4.22–5.81)
RDW: 13.5 % (ref 11.5–15.5)
WBC: 5.5 10*3/uL (ref 4.0–10.5)

## 2013-10-16 LAB — LIPASE, BLOOD: Lipase: 941 U/L — ABNORMAL HIGH (ref 11–59)

## 2013-10-16 NOTE — Progress Notes (Signed)
Subjective: No nausea or abdominal or dyspnea with clear liquids diet.  Objective: Vital signs in last 24 hours: Temp:  [98.1 F (36.7 C)-98.3 F (36.8 C)] 98.2 F (36.8 C) (04/18 0505) Pulse Rate:  [63-66] 63 (04/18 0505) Resp:  [16-18] 16 (04/18 0505) BP: (115-153)/(58-63) 147/60 mmHg (04/18 0505) SpO2:  [97 %-98 %] 98 % (04/18 0505) Weight:  [84.2 kg (185 lb 10 oz)] 84.2 kg (185 lb 10 oz) (04/18 0500) Weight change: 1 kg (2 lb 3.3 oz)   Intake/Output from previous day: 04/17 0701 - 04/18 0700 In: 3130 [P.O.:2060; I.V.:1070] Out: 1050 [Urine:1050]   General appearance: alert, cooperative and no distress Resp: clear to auscultation bilaterally Cardio: regular rate and rhythm GI: soft, non-tender; bowel sounds normal; no masses,  no organomegaly Extremities: extremities normal, atraumatic, no cyanosis or edema  Lab Results:  Recent Labs  10/15/13 0405 10/16/13 0332  WBC 4.7 5.5  HGB 10.9* 10.5*  HCT 32.6* 31.2*  PLT 208 194   BMET  Recent Labs  10/15/13 0405 10/16/13 0332  NA 141 140  K 4.6 4.9  CL 105 104  CO2 29 27  GLUCOSE 107* 91  BUN 19 17  CREATININE 1.34 1.32  CALCIUM 8.6 8.6   CMET CMP     Component Value Date/Time   NA 140 10/16/2013 0332   K 4.9 10/16/2013 0332   CL 104 10/16/2013 0332   CO2 27 10/16/2013 0332   GLUCOSE 91 10/16/2013 0332   BUN 17 10/16/2013 0332   CREATININE 1.32 10/16/2013 0332   CALCIUM 8.6 10/16/2013 0332   PROT 6.0 10/16/2013 0332   ALBUMIN 2.7* 10/16/2013 0332   AST 52* 10/16/2013 0332   ALT 29 10/16/2013 0332   ALKPHOS 369* 10/16/2013 0332   BILITOT 0.5 10/16/2013 0332   GFRNONAA 47* 10/16/2013 0332   GFRAA 54* 10/16/2013 0332    CBG (last 3)  No results found for this basename: GLUCAP,  in the last 72 hours  INR RESULTS:   Lab Results  Component Value Date   INR 1.06 01/15/2013   INR 0.96 08/14/2011   INR 1.0 08/18/2007     Studies/Results: No results found.  Medications: I have reviewed the patient's current  medications.  Assessment/Plan: #1 Pancreatitis: improving clinically and by lipase testing, so will advance to full liquids diet today, perhaps regular food for supper, possible discharge to home tomorrow.   LOS: 4 days   [name] [name] 10/16/2013, 7:59 AM

## 2013-10-17 LAB — COMPREHENSIVE METABOLIC PANEL
ALT: 29 U/L (ref 0–53)
AST: 47 U/L — ABNORMAL HIGH (ref 0–37)
Albumin: 2.8 g/dL — ABNORMAL LOW (ref 3.5–5.2)
Alkaline Phosphatase: 389 U/L — ABNORMAL HIGH (ref 39–117)
BUN: 18 mg/dL (ref 6–23)
CALCIUM: 8.7 mg/dL (ref 8.4–10.5)
CO2: 25 meq/L (ref 19–32)
Chloride: 103 mEq/L (ref 96–112)
Creatinine, Ser: 1.25 mg/dL (ref 0.50–1.35)
GFR, EST AFRICAN AMERICAN: 58 mL/min — AB (ref >90)
GFR, EST NON AFRICAN AMERICAN: 50 mL/min — AB (ref >90)
GLUCOSE: 115 mg/dL — AB (ref 70–99)
Potassium: 4.5 mEq/L (ref 3.7–5.3)
Sodium: 139 mEq/L (ref 137–147)
Total Bilirubin: 0.5 mg/dL (ref 0.3–1.2)
Total Protein: 6.4 g/dL (ref 6.0–8.3)

## 2013-10-17 LAB — CBC
HCT: 31.6 % — ABNORMAL LOW (ref 39.0–52.0)
HEMOGLOBIN: 10.6 g/dL — AB (ref 13.0–17.0)
MCH: 33.4 pg (ref 26.0–34.0)
MCHC: 33.5 g/dL (ref 30.0–36.0)
MCV: 99.7 fL (ref 78.0–100.0)
Platelets: 199 10*3/uL (ref 150–400)
RBC: 3.17 MIL/uL — ABNORMAL LOW (ref 4.22–5.81)
RDW: 13.4 % (ref 11.5–15.5)
WBC: 4.9 10*3/uL (ref 4.0–10.5)

## 2013-10-17 LAB — LIPASE, BLOOD: LIPASE: 1026 U/L — AB (ref 11–59)

## 2013-10-17 NOTE — Discharge Summary (Addendum)
Physician Discharge Summary  Patient ID: [name] [name] MRN: [id number] DOB/AGE: [birth year] 78 y.o.  Admit date: 10/12/2013 Discharge date: 10/17/2013   Discharge Diagnoses:  Principal Problem:   Pancreatitis Active Problems:   CAD   HYPERLIPIDEMIA   CKD (chronic kidney disease) stage 3, GFR 30-59 ml/min   Discharged Condition: good  Hospital Course:  The patient is an [age] Male with a history of prior cholecystectomy, Non Drinker and prior pancreatitis presented to our office 4/8 c 3 wks of N/V/Wt loss and constipation. Labs were obtained showing Pancreatitis. Lbs only became available today. He was seeing Dr Daphine DeutscherMartin today [facility] and baseline cr 1.5. Dr Daphine DeutscherMartin called me and said the pt was bloated, uncomfortable and wanted me to set up his CT ab and see him quickly. However since pt was [age] and lived 37 miles away and has clinically worsened we decided to send to ED. W/up showed uncomplicated Pancreatitis and worsened lab parameters c more azotemia. I was called to admit, eval and rx. Placed on Tele due to prior cardiac issues. Bowel rest and IVF started. Will have Dr Eloise HarmanPaterson see in am and decide on GI eval or MRCP/ERCP needs.  He was treated with bowel rest and IV fluids as well as pain medications. His condition improved rapidly and he was showing some decrease in his lipase levels. Gradually he was reintroduced to foods. He did not develop recurrent nausea or abdominal pain, despite continued moderate elevation of the lipase level. In addition he did not develop fever or leukocytosis. On the day of discharge he had a normal regular diet with no difficulty whatsoever. He had transient constipation that resolved with adjustment of medications. He has not had any problems with shortness of breath or chest or or palpitations. With a previous admission for pancreatitis he had a full workup including MRCP that was unremarkable, so this workup was not repeated.  Consults: None  Significant  Diagnostic Studies:  No results found.  Labs: Lab Results  Component Value Date   WBC 4.9 10/17/2013   HGB 10.6* 10/17/2013   HCT 31.6* 10/17/2013   MCV 99.7 10/17/2013   PLT 199 10/17/2013      Recent Labs Lab 10/17/13 0538  NA 139  K 4.5  CL 103  CO2 25  BUN 18  CREATININE 1.25  CALCIUM 8.7  PROT 6.4  BILITOT 0.5  ALKPHOS 389*  ALT 29  AST 47*  GLUCOSE 115*       Lab Results  Component Value Date   INR 1.06 01/15/2013   INR 0.96 08/14/2011   INR 1.0 08/18/2007     No results found for this or any previous visit (from the past 240 hour(s)).    Discharge Exam: Blood pressure 139/62, pulse 81, temperature 98.3 F (36.8 C), temperature source Oral, resp. rate 16, height 5\' 8"  (1.727 m), weight 84.732 kg (186 lb 12.8 oz), SpO2 97.00%.  Physical Exam: In general, he is a mildly overweight white man who was in no apparent distress while sitting upright after having breakfast. HEENT exam was within normal limits, neck was supple without jugular venous distention or carotid bruit, chest was clear to auscultation, heart had a regular rate and rhythm with a systolic ejection murmur grade 1/6 the left sternal border, abdomen had normal bowel sounds and no tenderness, extremities have bilateral trace leg edema. He was alert and well oriented with normal affect and able to move all extremities well.  Disposition: He'll be discharged from  the hospital to home today. If his nausea or abdominal discomfort recurs that he knows to switch to Gatorade only for food and fluids. We will see him later in the week as described below:      Discharge Orders   Future Appointments Provider Department Dept Phone   10/27/2013 11:30 AM [facility] 2 [facility]Nuc Treadm) [facility] [address] [phone]   11/04/2013 3:00 PM Lewayne BuntingBrian S Crenshaw, MD [facility] [address] Office [phone]   11/24/2013 8:40 AM [facility] [address] Office [phone]    Future Orders Complete By Expires   Call MD for:  As directed    Diet - low sodium heart healthy  As directed    Discharge instructions  As directed    Increase activity slowly  As directed        Medication List         allopurinol 300 MG tablet  Commonly known as:  ZYLOPRIM  Take 300 mg by mouth daily.     aspirin 81 MG tablet  Take 81 mg by mouth every morning.     atorvastatin 80 MG tablet  Commonly known as:  LIPITOR  Take 40 mg by mouth at bedtime.     finasteride 5 MG tablet  Commonly known as:  PROSCAR  Take 5 mg by mouth daily.     furosemide 40 MG tablet  Commonly known as:  LASIX  TAKE ONE TABLET BY MOUTH ONCE DAILY     losartan 25 MG tablet  Commonly known as:  COZAAR  Take 1 tablet (25 mg total) by mouth daily.     metoprolol 50 MG tablet  Commonly known as:  LOPRESSOR  TAKE ONE-HALF TABLET BY MOUTH TWICE DAILY     OCUVITE EXTRA Tabs  Take 2 tablets by mouth 2 (two) times daily.     pantoprazole 40 MG tablet  Commonly known as:  PROTONIX  Take 40 mg by mouth 2 (two) times daily.     polyethylene glycol powder powder  Commonly known as:  GLYCOLAX/MIRALAX  Take 17 g by mouth daily as needed (for constipation).     sertraline 100 MG tablet  Commonly known as:  ZOLOFT  Take 100 mg by mouth every morning.     VITAMIN D-3 PO  Take 1,000 Units by mouth daily.       Follow-up Information   Schedule an appointment as soon as possible for a visit with Garlan FillersPATERSON,Airen Dales G, MD.   Specialty:  Internal Medicine   Contact information:   [address] [place] [address] [phone]       Follow up with Garlan FillersPATERSON,Kandiss Ihrig G, MD In 3 days.   Specialty:  Internal Medicine   Contact information:   [address] [place] [address] [phone]       Signed: Jarome MatinDaniel Miosotis Wetsel 10/17/2013, 9:06 AM

## 2013-10-17 NOTE — Progress Notes (Signed)
Patient discharged home. Discharge instructions given and explained to patient and he verbalized understanding, denies any pain/distress. No wound noted. Accompanied home by daughter.

## 2013-10-20 ENCOUNTER — Encounter: Payer: Self-pay | Admitting: Cardiology

## 2013-10-20 ENCOUNTER — Encounter: Payer: Self-pay | Admitting: Internal Medicine

## 2013-10-22 ENCOUNTER — Telehealth: Payer: Self-pay | Admitting: Cardiology

## 2013-10-22 NOTE — Telephone Encounter (Signed)
New message          Pt was released from the hospital on Sunday. Pt is only to drink gatorade and no food for 7 days. Does the pt need to r/s his stress test?

## 2013-10-22 NOTE — Telephone Encounter (Signed)
Spoke with pt, will cancel his stress test. He will call back once his condition improves to reschedule.

## 2013-10-27 ENCOUNTER — Encounter (HOSPITAL_COMMUNITY): Payer: Medicare Other

## 2013-11-01 ENCOUNTER — Encounter (HOSPITAL_COMMUNITY): Payer: Self-pay | Admitting: Emergency Medicine

## 2013-11-01 ENCOUNTER — Inpatient Hospital Stay (HOSPITAL_COMMUNITY)
Admission: EM | Admit: 2013-11-01 | Discharge: 2013-11-17 | DRG: 438 | Disposition: A | Discharge status: Home Health | Payer: Medicare Other | Attending: Internal Medicine | Admitting: Internal Medicine

## 2013-11-01 DIAGNOSIS — K831 Obstruction of bile duct: Secondary | ICD-10-CM | POA: Diagnosis present

## 2013-11-01 DIAGNOSIS — R633 Feeding difficulties, unspecified: Secondary | ICD-10-CM | POA: Diagnosis present

## 2013-11-01 DIAGNOSIS — I5033 Acute on chronic diastolic (congestive) heart failure: Secondary | ICD-10-CM | POA: Diagnosis present

## 2013-11-01 DIAGNOSIS — R609 Edema, unspecified: Secondary | ICD-10-CM | POA: Diagnosis present

## 2013-11-01 DIAGNOSIS — E663 Overweight: Secondary | ICD-10-CM | POA: Diagnosis present

## 2013-11-01 DIAGNOSIS — Z87891 Personal history of nicotine dependence: Secondary | ICD-10-CM

## 2013-11-01 DIAGNOSIS — I359 Nonrheumatic aortic valve disorder, unspecified: Secondary | ICD-10-CM | POA: Diagnosis present

## 2013-11-01 DIAGNOSIS — N179 Acute kidney failure, unspecified: Secondary | ICD-10-CM | POA: Diagnosis present

## 2013-11-01 DIAGNOSIS — F329 Major depressive disorder, single episode, unspecified: Secondary | ICD-10-CM | POA: Diagnosis present

## 2013-11-01 DIAGNOSIS — I2510 Atherosclerotic heart disease of native coronary artery without angina pectoris: Secondary | ICD-10-CM | POA: Diagnosis present

## 2013-11-01 DIAGNOSIS — Z9089 Acquired absence of other organs: Secondary | ICD-10-CM

## 2013-11-01 DIAGNOSIS — D649 Anemia, unspecified: Secondary | ICD-10-CM | POA: Diagnosis present

## 2013-11-01 DIAGNOSIS — E8809 Other disorders of plasma-protein metabolism, not elsewhere classified: Secondary | ICD-10-CM | POA: Diagnosis present

## 2013-11-01 DIAGNOSIS — I509 Heart failure, unspecified: Secondary | ICD-10-CM | POA: Diagnosis present

## 2013-11-01 DIAGNOSIS — Z7982 Long term (current) use of aspirin: Secondary | ICD-10-CM

## 2013-11-01 DIAGNOSIS — N400 Enlarged prostate without lower urinary tract symptoms: Secondary | ICD-10-CM | POA: Diagnosis present

## 2013-11-01 DIAGNOSIS — E785 Hyperlipidemia, unspecified: Secondary | ICD-10-CM | POA: Diagnosis present

## 2013-11-01 DIAGNOSIS — N183 Chronic kidney disease, stage 3 unspecified: Secondary | ICD-10-CM | POA: Diagnosis present

## 2013-11-01 DIAGNOSIS — I350 Nonrheumatic aortic (valve) stenosis: Secondary | ICD-10-CM | POA: Diagnosis present

## 2013-11-01 DIAGNOSIS — R112 Nausea with vomiting, unspecified: Secondary | ICD-10-CM | POA: Diagnosis present

## 2013-11-01 DIAGNOSIS — E860 Dehydration: Secondary | ICD-10-CM | POA: Diagnosis present

## 2013-11-01 DIAGNOSIS — M109 Gout, unspecified: Secondary | ICD-10-CM | POA: Diagnosis present

## 2013-11-01 DIAGNOSIS — Z79899 Other long term (current) drug therapy: Secondary | ICD-10-CM

## 2013-11-01 DIAGNOSIS — R0609 Other forms of dyspnea: Secondary | ICD-10-CM | POA: Diagnosis present

## 2013-11-01 DIAGNOSIS — Z96659 Presence of unspecified artificial knee joint: Secondary | ICD-10-CM

## 2013-11-01 DIAGNOSIS — E43 Unspecified severe protein-calorie malnutrition: Secondary | ICD-10-CM | POA: Diagnosis present

## 2013-11-01 DIAGNOSIS — Z8711 Personal history of peptic ulcer disease: Secondary | ICD-10-CM

## 2013-11-01 DIAGNOSIS — Z950 Presence of cardiac pacemaker: Secondary | ICD-10-CM

## 2013-11-01 DIAGNOSIS — Z6830 Body mass index (BMI) 30.0-30.9, adult: Secondary | ICD-10-CM

## 2013-11-01 DIAGNOSIS — H3530 Unspecified macular degeneration: Secondary | ICD-10-CM | POA: Diagnosis present

## 2013-11-01 DIAGNOSIS — K219 Gastro-esophageal reflux disease without esophagitis: Secondary | ICD-10-CM | POA: Diagnosis present

## 2013-11-01 DIAGNOSIS — F3289 Other specified depressive episodes: Secondary | ICD-10-CM | POA: Diagnosis present

## 2013-11-01 DIAGNOSIS — K859 Acute pancreatitis without necrosis or infection, unspecified: Principal | ICD-10-CM | POA: Diagnosis present

## 2013-11-01 DIAGNOSIS — I129 Hypertensive chronic kidney disease with stage 1 through stage 4 chronic kidney disease, or unspecified chronic kidney disease: Secondary | ICD-10-CM | POA: Diagnosis present

## 2013-11-01 DIAGNOSIS — R7989 Other specified abnormal findings of blood chemistry: Secondary | ICD-10-CM | POA: Diagnosis present

## 2013-11-01 DIAGNOSIS — R0989 Other specified symptoms and signs involving the circulatory and respiratory systems: Secondary | ICD-10-CM | POA: Diagnosis present

## 2013-11-01 DIAGNOSIS — I679 Cerebrovascular disease, unspecified: Secondary | ICD-10-CM | POA: Diagnosis present

## 2013-11-01 DIAGNOSIS — K5900 Constipation, unspecified: Secondary | ICD-10-CM | POA: Diagnosis present

## 2013-11-01 HISTORY — DX: Presence of cardiac pacemaker: Z95.0

## 2013-11-01 LAB — COMPREHENSIVE METABOLIC PANEL
ALBUMIN: 3.1 g/dL — AB (ref 3.5–5.2)
ALT: 163 U/L — ABNORMAL HIGH (ref 0–53)
AST: 278 U/L — ABNORMAL HIGH (ref 0–37)
Alkaline Phosphatase: 1051 U/L — ABNORMAL HIGH (ref 39–117)
BUN: 45 mg/dL — AB (ref 6–23)
CO2: 28 mEq/L (ref 19–32)
CREATININE: 3.21 mg/dL — AB (ref 0.50–1.35)
Calcium: 9.5 mg/dL (ref 8.4–10.5)
Chloride: 93 mEq/L — ABNORMAL LOW (ref 96–112)
GFR calc non Af Amer: 16 mL/min — ABNORMAL LOW (ref >90)
GFR, EST AFRICAN AMERICAN: 19 mL/min — AB (ref >90)
Glucose, Bld: 132 mg/dL — ABNORMAL HIGH (ref 70–99)
Potassium: 3.9 mEq/L (ref 3.7–5.3)
Sodium: 138 mEq/L (ref 137–147)
TOTAL PROTEIN: 7.7 g/dL (ref 6.0–8.3)
Total Bilirubin: 7.6 mg/dL — ABNORMAL HIGH (ref 0.3–1.2)

## 2013-11-01 LAB — CBC WITH DIFFERENTIAL/PLATELET
BASOS PCT: 0 % (ref 0–1)
Basophils Absolute: 0 10*3/uL (ref 0.0–0.1)
EOS ABS: 0 10*3/uL (ref 0.0–0.7)
Eosinophils Relative: 1 % (ref 0–5)
HEMATOCRIT: 32.6 % — AB (ref 39.0–52.0)
HEMOGLOBIN: 11.3 g/dL — AB (ref 13.0–17.0)
Lymphocytes Relative: 9 % — ABNORMAL LOW (ref 12–46)
Lymphs Abs: 0.6 10*3/uL — ABNORMAL LOW (ref 0.7–4.0)
MCH: 34.5 pg — AB (ref 26.0–34.0)
MCHC: 34.7 g/dL (ref 30.0–36.0)
MCV: 99.4 fL (ref 78.0–100.0)
MONO ABS: 0.7 10*3/uL (ref 0.1–1.0)
MONOS PCT: 10 % (ref 3–12)
Neutro Abs: 5.7 10*3/uL (ref 1.7–7.7)
Neutrophils Relative %: 81 % — ABNORMAL HIGH (ref 43–77)
Platelets: 207 10*3/uL (ref 150–400)
RBC: 3.28 MIL/uL — ABNORMAL LOW (ref 4.22–5.81)
RDW: 15.3 % (ref 11.5–15.5)
WBC: 7 10*3/uL (ref 4.0–10.5)

## 2013-11-01 LAB — I-STAT CG4 LACTIC ACID, ED: LACTIC ACID, VENOUS: 1.37 mmol/L (ref 0.5–2.2)

## 2013-11-01 LAB — LIPASE, BLOOD: LIPASE: 2853 U/L — AB (ref 11–59)

## 2013-11-01 MED ORDER — SODIUM CHLORIDE 0.9 % IV BOLUS (SEPSIS)
1000.0000 mL | Freq: Once | INTRAVENOUS | Status: AC
  Administered 2013-11-01: 1000 mL via INTRAVENOUS

## 2013-11-01 MED ORDER — SODIUM CHLORIDE 0.9 % IV SOLN
INTRAVENOUS | Status: DC
  Administered 2013-11-01 – 2013-11-07 (×6): via INTRAVENOUS

## 2013-11-01 NOTE — ED Notes (Signed)
Pt complains of abd pain since earlier today, was seen at Dr Worthy FlankPatersons office and had blood work, the nurse told them that something was abnormal, but they cant' remember what was abnormal, they suggested that he come for IV fluids and further evaluation

## 2013-11-01 NOTE — ED Notes (Signed)
Pt to exam room in wheelchair. Family with pt.

## 2013-11-01 NOTE — ED Notes (Signed)
Pt tried to urinate but was unable to at the time.

## 2013-11-01 NOTE — ED Notes (Signed)
I stat Lactic acid results given to ERP Horton

## 2013-11-01 NOTE — ED Provider Notes (Signed)
CSN: [id number]     Arrival date & time 11/01/13  2141 History   First MD Initiated Contact with Patient 11/01/13 2307     Chief Complaint  Patient presents with  . Abdominal Pain     (Consider location/radiation/quality/duration/timing/severity/associated sxs/prior Treatment) HPI HX per PT  - Dx with pancreatitis 3 weeks ago with admission at that time, had CT scan, stayed 5 days and saw PCP Dr Jarold Motto today in the office. At that time was feeling his normal self. He had blood work performed. Later today developed severe epigastric pain and diarrhea, no bloody or watery stools, states he felt even sicker than his bout of pancreatitis.  PTs daughter called DR Maris Berger nurses and PT was told to come to the ER for admission, and abnormal blood work. ABD pain has now completely resolved. PT has not noticed any changes in skin color or jaundice. Last urination was 5pm, has had decreased urine output today Past Medical History  Diagnosis Date  . Osteoarthritis   . Gout   . Hypertension   . History of coronary artery disease   . GERD (gastroesophageal reflux disease)   . CHF (congestive heart failure)   . Diastolic dysfunction   . History of GI bleed   . History of pacemaker     Medtronic Adapta ADDro1  . Peptic ulcer disease   . BPH (benign prostatic hypertrophy)   . 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 Hyperlipidemia   . History of cerebrovascular disease   . Kidney stone   . Depression   . Chest pain    Past Surgical History  Procedure Laterality Date  . Pacemaker insertion      Medtronic Adapta ADDro1  . Cholecystectomy    . Total knee arthroplasty    . Hernia repair    . Tonsillectomy    . Total knee arthroplasty  08/28/2011    Procedure: TOTAL KNEE ARTHROPLASTY;  Surgeon: Drucilla Schmidt, MD;  Location: [facility];  Service: Orthopedics;  Laterality: Left;   Family History  Problem Relation Age of Onset  . Diabetes Mother   . Leukemia Father    History   Substance Use Topics  . Smoking status: Former Games developer  . Smokeless tobacco: Former Neurosurgeon    Quit date: 08/13/1976  . Alcohol Use: No    Review of Systems  Constitutional: Negative for fever and chills.  Eyes: Negative for visual disturbance.  Respiratory: Negative for shortness of breath.   Cardiovascular: Negative for chest pain.  Gastrointestinal: Positive for abdominal pain and diarrhea. Negative for vomiting.  Genitourinary: Positive for decreased urine volume. Negative for dysuria.  Musculoskeletal: Negative for back pain.  Skin: Positive for color change. Negative for rash.  Neurological: Negative for headaches.  All other systems reviewed and are negative.     Allergies  Review of patient's allergies indicates no known allergies.  Home Medications   Prior to Admission medications   Medication Sig Start Date End Date Taking? Authorizing Provider  allopurinol (ZYLOPRIM) 300 MG tablet Take 300 mg by mouth daily.    Yes Historical Provider, MD  aspirin 81 MG tablet Take 81 mg by mouth every morning.    Yes Historical Provider, MD  atorvastatin (LIPITOR) 80 MG tablet Take 40 mg by mouth at bedtime.    Yes Historical Provider, MD  cholecalciferol (VITAMIN D) 1000 UNITS tablet Take 1,000 Units by mouth daily.   Yes Historical Provider, MD  finasteride (PROSCAR) 5 MG  tablet Take 5 mg by mouth daily.    Yes Historical Provider, MD  furosemide (LASIX) 40 MG tablet Take 40 mg by mouth daily.   Yes Historical Provider, MD  losartan (COZAAR) 25 MG tablet Take 1 tablet (25 mg total) by mouth daily. 05/18/13  Yes Duke SalviaSteven C Klein, MD  metoprolol (LOPRESSOR) 50 MG tablet Take 25 mg by mouth 2 (two) times daily.   Yes Historical Provider, MD  Multiple Vitamins-Minerals (OCUVITE EXTRA) TABS Take 2 tablets by mouth 2 (two) times daily.    Yes Historical Provider, MD  pantoprazole (PROTONIX) 40 MG tablet Take 40 mg by mouth 2 (two) times daily.    Yes Historical Provider, MD  polyethylene  glycol powder (GLYCOLAX/MIRALAX) powder Take 17 g by mouth daily as needed for mild constipation (for constipation).    Yes Historical Provider, MD  sertraline (ZOLOFT) 100 MG tablet Take 100 mg by mouth every morning.   Yes Historical Provider, MD   BP 114/55  Pulse 65  Temp(Src) 98.4 F (36.9 C) (Oral)  Resp 16  SpO2 97% Physical Exam  Constitutional: He is oriented to person, place, and time. He appears well-developed and well-nourished.  HENT:  Head: Normocephalic and atraumatic.  Dry mm  Eyes: EOM are normal. Pupils are equal, round, and reactive to light. Scleral icterus is present.  Neck: Neck supple.  Cardiovascular: Normal rate, regular rhythm and intact distal pulses.   Pulmonary/Chest: Effort normal and breath sounds normal. No respiratory distress.  Abdominal: Soft. Bowel sounds are normal. He exhibits no distension. There is no rebound and no guarding.  Mild epigastric tenderness  Musculoskeletal: Normal range of motion. He exhibits no edema.  Neurological: He is alert and oriented to person, place, and time. No cranial nerve deficit.  Skin: Skin is warm and dry.  jaundice    ED Course  Procedures (including critical care time) Labs Review Labs Reviewed  CBC WITH DIFFERENTIAL - Abnormal; Notable for the following:    RBC 3.28 (*)    Hemoglobin 11.3 (*)    HCT 32.6 (*)    MCH 34.5 (*)    Neutrophils Relative % 81 (*)    Lymphocytes Relative 9 (*)    Lymphs Abs 0.6 (*)    All other components within normal limits  COMPREHENSIVE METABOLIC PANEL - Abnormal; Notable for the following:    Chloride 93 (*)    Glucose, Bld 132 (*)    BUN 45 (*)    Creatinine, Ser 3.21 (*)    Albumin 3.1 (*)    AST 278 (*)    ALT 163 (*)    Alkaline Phosphatase 1051 (*)    Total Bilirubin 7.6 (*)    GFR calc non Af Amer 16 (*)    GFR calc Af Amer 19 (*)    All other components within normal limits  LIPASE, BLOOD - Abnormal; Notable for the following:    Lipase 2853 (*)    All  other components within normal limits  URINALYSIS, ROUTINE W REFLEX MICROSCOPIC  COMPREHENSIVE METABOLIC PANEL  I-STAT CG4 LACTIC ACID, ED    Previous records reviewed - last CT scan A/P 3 weeks ago: IMPRESSION:  1. Inflammation associated with the head and body of the pancreas is  consists with mild acute pancreatitis. No organized fluid  collections.  2. New lobular lesion extending from the lower pole right kidney is  concerning for a small renal cell carcinoma. Recommend non emergent  urology consultation and consider reduced volume IV  contrast CT or  MRI renal exam for further characterization (renal insufficiency).  Electronically Signed  By: Genevive BiStewart Edmunds M.D.  On: 10/12/2013 15:08  IVFs provided. PCP consulted, [facility] will admit  MDM   Dx: Pancreatitis  Labs reviewed, IVFs provided, pain free in the ED.  Old records reviewed including recent CT scan as above MED admit  Sunnie NielsenBrian Quinci Gavidia, MD 11/02/13 [phone]

## 2013-11-02 ENCOUNTER — Inpatient Hospital Stay (HOSPITAL_COMMUNITY): Payer: Medicare Other

## 2013-11-02 ENCOUNTER — Encounter (HOSPITAL_COMMUNITY): Payer: Self-pay

## 2013-11-02 DIAGNOSIS — R112 Nausea with vomiting, unspecified: Secondary | ICD-10-CM | POA: Diagnosis present

## 2013-11-02 DIAGNOSIS — E43 Unspecified severe protein-calorie malnutrition: Secondary | ICD-10-CM | POA: Diagnosis present

## 2013-11-02 LAB — COMPREHENSIVE METABOLIC PANEL
ALBUMIN: 2.4 g/dL — AB (ref 3.5–5.2)
ALK PHOS: 855 U/L — AB (ref 39–117)
ALT: 130 U/L — ABNORMAL HIGH (ref 0–53)
AST: 222 U/L — ABNORMAL HIGH (ref 0–37)
BUN: 40 mg/dL — AB (ref 6–23)
CALCIUM: 8.7 mg/dL (ref 8.4–10.5)
CO2: 26 mEq/L (ref 19–32)
Chloride: 101 mEq/L (ref 96–112)
Creatinine, Ser: 2.91 mg/dL — ABNORMAL HIGH (ref 0.50–1.35)
GFR calc non Af Amer: 18 mL/min — ABNORMAL LOW (ref >90)
GFR, EST AFRICAN AMERICAN: 21 mL/min — AB (ref >90)
Glucose, Bld: 117 mg/dL — ABNORMAL HIGH (ref 70–99)
Potassium: 3.7 mEq/L (ref 3.7–5.3)
Sodium: 139 mEq/L (ref 137–147)
TOTAL PROTEIN: 6 g/dL (ref 6.0–8.3)
Total Bilirubin: 6.4 mg/dL — ABNORMAL HIGH (ref 0.3–1.2)

## 2013-11-02 LAB — URINALYSIS, ROUTINE W REFLEX MICROSCOPIC
Glucose, UA: NEGATIVE mg/dL
Ketones, ur: NEGATIVE mg/dL
Leukocytes, UA: NEGATIVE
NITRITE: NEGATIVE
PROTEIN: 30 mg/dL — AB
SPECIFIC GRAVITY, URINE: 1.009 (ref 1.005–1.030)
Urobilinogen, UA: 1 mg/dL (ref 0.0–1.0)
pH: 6 (ref 5.0–8.0)

## 2013-11-02 LAB — URINE MICROSCOPIC-ADD ON

## 2013-11-02 MED ORDER — PANTOPRAZOLE SODIUM 40 MG IV SOLR
40.0000 mg | Freq: Every day | INTRAVENOUS | Status: DC
  Administered 2013-11-02 – 2013-11-15 (×15): 40 mg via INTRAVENOUS
  Filled 2013-11-02 (×16): qty 40

## 2013-11-02 MED ORDER — HYDROMORPHONE HCL PF 1 MG/ML IJ SOLN
1.0000 mg | INTRAMUSCULAR | Status: DC | PRN
  Administered 2013-11-02 – 2013-11-16 (×39): 1 mg via INTRAVENOUS
  Filled 2013-11-02 (×40): qty 1

## 2013-11-02 MED ORDER — SODIUM CHLORIDE 0.9 % IV SOLN
INTRAVENOUS | Status: AC
  Administered 2013-11-02 (×2): 125 mL/h via INTRAVENOUS
  Administered 2013-11-03 – 2013-11-08 (×8): via INTRAVENOUS

## 2013-11-02 MED ORDER — ONDANSETRON HCL 4 MG/2ML IJ SOLN
4.0000 mg | Freq: Four times a day (QID) | INTRAMUSCULAR | Status: DC | PRN
  Administered 2013-11-02 – 2013-11-17 (×15): 4 mg via INTRAVENOUS
  Filled 2013-11-02 (×15): qty 2

## 2013-11-02 MED ORDER — IOHEXOL 300 MG/ML  SOLN
50.0000 mL | Freq: Once | INTRAMUSCULAR | Status: AC | PRN
  Administered 2013-11-02: 50 mL via ORAL

## 2013-11-02 NOTE — ED Notes (Signed)
Report given to floor, pt waiting to have CT done before transporting upstairs.

## 2013-11-02 NOTE — H&P (Signed)
PCP:   Garlan FillersPATERSON,DANIEL G, MD   Chief Complaint:  Abdominal Pain, Nausea and vomiting  HPI: 80 YO WM with hx recent pancreatitis. Was seen in our office today and labs were done. Reportedly the numbers were better. At about 4:30 PM he had a bad spell of mid epigastic abd pain with nausea and vomiting. He had 2 large BM's and felt faint. The total time of abd pain was about an hour.  He is weak and a big nauseous. He has no SOB or CP, He has been eating lightly for several days Review of Systems:  Review of Systems - as above Past Medical History: Past Medical History  Diagnosis Date  . Osteoarthritis   . Gout   . Hypertension   . History of coronary artery disease   . GERD (gastroesophageal reflux disease)   . CHF (congestive heart failure)   . Diastolic dysfunction   . History of GI bleed   . History of pacemaker     Medtronic Adapta ADDro1  . Peptic ulcer disease   . BPH (benign prostatic hypertrophy)   . 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 Hyperlipidemia   . History of cerebrovascular disease   . Kidney stone   . Depression   . Chest pain    Past Surgical History  Procedure Laterality Date  . Pacemaker insertion      Medtronic Adapta ADDro1  . Cholecystectomy    . Total knee arthroplasty    . Hernia repair    . Tonsillectomy    . Total knee arthroplasty  08/28/2011    Procedure: TOTAL KNEE ARTHROPLASTY;  Surgeon: Drucilla SchmidtJames P Aplington, MD;  Location: [facility];  Service: Orthopedics;  Laterality: Left;    Medications: Prior to Admission medications   Medication Sig Start Date End Date Taking? Authorizing Provider  allopurinol (ZYLOPRIM) 300 MG tablet Take 300 mg by mouth daily.    Yes Historical Provider, MD  aspirin 81 MG tablet Take 81 mg by mouth every morning.    Yes Historical Provider, MD  atorvastatin (LIPITOR) 80 MG tablet Take 40 mg by mouth at bedtime.    Yes Historical Provider, MD  cholecalciferol (VITAMIN D) 1000 UNITS tablet Take 1,000 Units  by mouth daily.   Yes Historical Provider, MD  finasteride (PROSCAR) 5 MG tablet Take 5 mg by mouth daily.    Yes Historical Provider, MD  furosemide (LASIX) 40 MG tablet Take 40 mg by mouth daily.   Yes Historical Provider, MD  losartan (COZAAR) 25 MG tablet Take 1 tablet (25 mg total) by mouth daily. 05/18/13  Yes Duke SalviaSteven C Klein, MD  metoprolol (LOPRESSOR) 50 MG tablet Take 25 mg by mouth 2 (two) times daily.   Yes Historical Provider, MD  Multiple Vitamins-Minerals (OCUVITE EXTRA) TABS Take 2 tablets by mouth 2 (two) times daily.    Yes Historical Provider, MD  pantoprazole (PROTONIX) 40 MG tablet Take 40 mg by mouth 2 (two) times daily.    Yes Historical Provider, MD  polyethylene glycol powder (GLYCOLAX/MIRALAX) powder Take 17 g by mouth daily as needed for mild constipation (for constipation).    Yes Historical Provider, MD  sertraline (ZOLOFT) 100 MG tablet Take 100 mg by mouth every morning.   Yes Historical Provider, MD    Allergies:  No Known Allergies  Social History:  reports that he has quit smoking. He quit smokeless tobacco use about 37 years ago. He reports that he does not drink alcohol  or use illicit drugs.  Family History: Family History  Problem Relation Age of Onset  . Diabetes Mother   . Leukemia Father     Physical Exam: Filed Vitals:   11/01/13 2159  BP: 114/55  Pulse: 65  Temp: 98.4 F (36.9 C)  TempSrc: Oral  Resp: 16  SpO2: 97%   General appearance: elderly WM in no distress Anicteric, no nystagmus Oral membranse moist  Neck supple, no carotid bruit, no JVD and thyroid not enlarged, symmetric, no tenderness/mass/nodules Resp: clear with no wheezes, rales or rhonchi Cardio: regular with sys murmur GI: obese protuberant, distended.ventral hernia, diminished BS's.  Extremities: right DP a bit reduced. Left intact, no edema Lymph nodes: Cervical adenopathy: no cervical lymphadenopathy Neurologic: awake, Alert and oriented X 3, clear fluent  speech  Labs on Admission:   Recent Labs  11/01/13 2245  NA 138  K 3.9  CL 93*  CO2 28  GLUCOSE 132*  BUN 45*  CREATININE 3.21*  CALCIUM 9.5    Recent Labs  11/01/13 2245  AST 278*  ALT 163*  ALKPHOS 1051*  BILITOT 7.6*  PROT 7.7  ALBUMIN 3.1*    Recent Labs  11/01/13 2245  LIPASE 2853*    Recent Labs  11/01/13 2245  WBC 7.0  NEUTROABS 5.7  HGB 11.3*  HCT 32.6*  MCV 99.4  PLT 207        Radiological Exams on Admission: Ct Abdomen Pelvis Wo Contrast  10/12/2013   CLINICAL DATA:  Lower abdominal pain, nausea and vomiting  EXAM: CT ABDOMEN AND PELVIS WITHOUT CONTRAST  TECHNIQUE: Multidetector CT imaging of the abdomen and pelvis was performed following the standard protocol without intravenous contrast.  COMPARISON:  CT PELVIS W/CM dated 05/18/2008  FINDINGS: Lung bases are clear.  No pericardial fluid.  No focal hepatic lesion on this noncontrast exam. Post cholecystectomy. There is mild stranding surrounding the head and body of the pancreas. No organized fluid collections. Spleen, adrenal glands, and left kidney are normal. There is a new round intermediate density lesion extending from the lower pole of the right kidney measuring 21 mm. Benign appearing cyst extending laterally from the right kidney  The stomach, small bowel, appendix, cecum are normal. Colon rectosigmoid colon are normal  Abdominal aorta is normal caliber. No retroperitoneal periportal lymphadenopathy. No free fluid the pelvis. Bladder contains several small diverticulum diverticula. The prostate gland with bilateral fat filled inguinal hernias. No aggressive osseous lesion.  IMPRESSION: 1. Inflammation associated with the head and body of the pancreas is consists with mild acute pancreatitis. No organized fluid collections. 2. New lobular lesion extending from the lower pole right kidney is concerning for a small renal cell carcinoma. Recommend non emergent urology consultation and consider  reduced volume IV contrast CT or MRI renal exam for further characterization (renal insufficiency).   Electronically Signed   By: Genevive BiStewart  Edmunds M.D.   On: 10/12/2013 15:08   Dg Abd Portable 2v  10/13/2013   CLINICAL DATA:  Abdominal pain.  Evaluate for an ileus.  EXAM: PORTABLE ABDOMEN - 1 VIEW  COMPARISON:  CT ABD/PELV WO CM dated 10/12/2013  FINDINGS: The oral contrast has advanced into the colon. Contrast is predominantly in the right colon and transverse colon. There are cholecystectomy clips in the right upper abdomen. Few gas-filled loops of small bowel but no evidence to suggest obstruction. Marked degenerative changes at the pubic symphysis. Sclerosis at the femoral heads, left side greater than right. Findings are likely degenerative in etiology.  Left lateral decubitus images were obtained and there is no evidence for free air.  IMPRESSION: Oral contrast has progressed into the colon and there is no evidence to suggest a bowel obstruction or ileus.   Electronically Signed   By: Richarda Overlie M.D.   On: 10/13/2013 07:56   Orders placed in visit on 10/20/13  . EXERCISE TOLERANCE TEST    Assessment/Plan Principal Problem:   Pancreatitis: seems to have acutely worsened. Labs earlier today were reportedly better. Now lipase and LFT's are much higher. Severe episode earlier but mostly pain free. Make NPO> hydrate> bedrest Check CT to see if there is a pseudocyst or evidence of necrosis/hemorrhage (doubt). Watch on tele. Hemodynamically stable. Ca OK at 9.5, Bicarb OK. Alb is a bit low Prob need to get GI back involved May need pharmacy review to see if this might be med related Active Problems:   Other and unspecified hyperlipidemia: TG were only 97 recently   Coronary atherosclerosis of unspecified type of vessel, native or graft: no acute pain, check EKG   CKD (chronic kidney disease) stage 3, GFR 30-59 ml/min: Cr has worsened. Hydrate   Nausea & vomiting: PRN zofran Hypertension: Hold  Rx Hyperlipidemia: hold Rx Gout: hold Rx Full code   Rosalio L[facility] 11/02/2013, 12:19 AM

## 2013-11-02 NOTE — Progress Notes (Signed)
INITIAL NUTRITION ASSESSMENT  Pt meets criteria for severe MALNUTRITION in the context of chronic illness as evidenced by <75% estimated energy intake in the past month with 6.2% weight loss in the past 3 weeks per pt report.  DOCUMENTATION CODES Per approved criteria  -Severe malnutrition in the context of chronic illness   INTERVENTION: - Diet advancement per MD - If TF started, recommend slow rate of advancement as pt at high risk of refeeding syndrome r/t minimal intake x 7 days. Recommend close monitoring/replacement of potassium, magnesium, and phosphorus. - Will continue to monitor   NUTRITION DIAGNOSIS: Inadequate oral intake related to inability to eat as evidenced by NPO.   Goal: Advance diet as tolerated to low fat diet   Monitor:  Weights, labs, diet advancement, TF  Reason for Assessment: Malnutrition screening tool   80 y.o. male  Admitting Dx: Pancreatitis  ASSESSMENT: Pt with hx of recent pancreatitis, admitted with abdominal pain that started yesterday. Pt discussed during multidisciplinary rounds.   -Met with pt who c/o lack of hunger for the past 3 weeks -Spent the past 7 days consuming 20-30oz of Gatorade, did not eat anything -Thinks he's lost 12 pounds in the past 3 weeks -Before previous admission he was eating whatever he wanted to, 3 meals/day, denies any specific high fat foods he regularly consumed -C/o diarrhea that started yesterday afternoon and denied any vomiting but did c/o nausea in the past week -Noted MD discussed with pt possible post-pyloric TF, awaiting GI evaluation   BUN/Cr elevated with low GFR Alk phos, lipase, AST/ALT, and total bilirubin elevated   Nutrition Focused Physical Exam:  Subcutaneous Fat:  Orbital Region: WNL Upper Arm Region: moderate muscle wasting Thoracic and Lumbar Region: WNL  Muscle:  Temple Region: WNL Clavicle Bone Region: WNL Clavicle and Acromion Bone Region: WNL Scapular Bone Region: NA Dorsal  Hand: WNL  Edema: None noted     Height: Ht Readings from Last 1 Encounters:  11/02/13 5' 8.5" (1.74 m)    Weight: Wt Readings from Last 1 Encounters:  11/02/13 180 lb 11.2 oz (81.965 kg)    Ideal Body Weight: 154 lbs   % Ideal Body Weight: 117%  Wt Readings from Last 10 Encounters:  11/02/13 180 lb 11.2 oz (81.965 kg)  10/17/13 186 lb 12.8 oz (84.732 kg)  10/11/13 187 lb 3.2 oz (84.913 kg)  05/18/13 195 lb (88.451 kg)  02/26/13 193 lb 12.8 oz (87.907 kg)  02/03/13 193 lb (87.544 kg)  01/15/13 199 lb (90.266 kg)  01/06/13 188 lb (85.276 kg)  06/16/12 191 lb (86.637 kg)  05/08/12 191 lb 9.6 oz (86.909 kg)    Usual Body Weight: 192 lbs per pt  % Usual Body Weight: 94%  BMI:  Body mass index is 27.07 kg/(m^2).  Estimated Nutritional Needs: Kcal: 0109-3235 Protein: 105g Fluid: 1.7-1.9L/day  Skin: Jaundice  Diet Order: NPO  EDUCATION NEEDS: -Education needs addressed - briefly discussed diet therapy for pancreatitis with an emphasis on low fat diet   Intake/Output Summary (Last 24 hours) at 11/02/13 1042 Last data filed at 11/02/13 0731  Gross per 24 hour  Intake 904.17 ml  Output    950 ml  Net -45.83 ml    Last BM: 5/4  Labs:   Recent Labs Lab 11/01/13 2245 11/02/13 0354  NA 138 139  K 3.9 3.7  CL 93* 101  CO2 28 26  BUN 45* 40*  CREATININE 3.21* 2.91*  CALCIUM 9.5 8.7  GLUCOSE 132* 117*  CBG (last 3)  No results found for this basename: GLUCAP,  in the last 72 hours  Scheduled Meds: . pantoprazole (PROTONIX) IV  40 mg Intravenous QHS    Continuous Infusions: . sodium chloride 125 mL/hr at 11/01/13 2346  . sodium chloride 125 mL/hr (11/02/13 1015)    Past Medical History  Diagnosis Date  . Osteoarthritis   . Gout   . Hypertension   . History of coronary artery disease   . GERD (gastroesophageal reflux disease)   . CHF (congestive heart failure)   . Diastolic dysfunction   . History of GI bleed   . History of pacemaker      Medtronic Adapta ADDro1  . Peptic ulcer disease   . BPH (benign prostatic hypertrophy)   . 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 Hyperlipidemia   . History of cerebrovascular disease   . Kidney stone   . Depression   . Chest pain     Past Surgical History  Procedure Laterality Date  . Pacemaker insertion      Medtronic Adapta ADDro1  . Cholecystectomy    . Total knee arthroplasty    . Hernia repair    . Tonsillectomy    . Total knee arthroplasty  08/28/2011    Procedure: TOTAL KNEE ARTHROPLASTY;  Surgeon: Magnus Sinning, MD;  Location: [facility];  Service: Orthopedics;  Laterality: Left;    Mikey College MS, [place], [place] Pager [phone] After Hours Pager

## 2013-11-02 NOTE — Progress Notes (Signed)
Subjective: He continues to have a mild epigastric gnawing pain, no nausea or dyspnea  Objective: Vital signs in last 24 hours: Temp:  [97.6 F (36.4 C)-98.4 F (36.9 C)] 97.7 F (36.5 C) (05/05 0536) Pulse Rate:  [65-81] 81 (05/05 0536) Resp:  [16-20] 20 (05/05 0536) BP: (98-136)/(51-57) 98/51 mmHg (05/05 0536) SpO2:  [97 %-100 %] 97 % (05/05 0536) Weight:  [81.965 kg (180 lb 11.2 oz)] 81.965 kg (180 lb 11.2 oz) (05/05 0222) Weight change:    Intake/Output from previous day: 05/04 0701 - 05/05 0700 In: 904.2 [I.V.:904.2] Out: 400 [Urine:400]   General appearance: alert, cooperative and no distress Resp: clear to auscultation bilaterally Cardio: regular rate and rhythm GI: bowel sounds normal, no significant tenderness  Lab Results:  Recent Labs  11/01/13 2245  WBC 7.0  HGB 11.3*  HCT 32.6*  PLT 207   BMET  Recent Labs  11/01/13 2245 11/02/13 0354  NA 138 139  K 3.9 3.7  CL 93* 101  CO2 28 26  GLUCOSE 132* 117*  BUN 45* 40*  CREATININE 3.21* 2.91*  CALCIUM 9.5 8.7   CMET CMP     Component Value Date/Time   NA 139 11/02/2013 0354   K 3.7 11/02/2013 0354   CL 101 11/02/2013 0354   CO2 26 11/02/2013 0354   GLUCOSE 117* 11/02/2013 0354   BUN 40* 11/02/2013 0354   CREATININE 2.91* 11/02/2013 0354   CALCIUM 8.7 11/02/2013 0354   PROT 6.0 11/02/2013 0354   ALBUMIN 2.4* 11/02/2013 0354   AST 222* 11/02/2013 0354   ALT 130* 11/02/2013 0354   ALKPHOS 855* 11/02/2013 0354   BILITOT 6.4* 11/02/2013 0354   GFRNONAA 18* 11/02/2013 0354   GFRAA 21* 11/02/2013 0354    CBG (last 3)  No results found for this basename: GLUCAP,  in the last 72 hours  INR RESULTS:   Lab Results  Component Value Date   INR 1.06 01/15/2013   INR 0.96 08/14/2011   INR 1.0 08/18/2007     Studies/Results: Ct Abdomen Wo Contrast  11/02/2013   CLINICAL DATA:  Pancreatitis.  EXAM: CT ABDOMEN WITHOUT CONTRAST  TECHNIQUE: Multidetector CT imaging of the abdomen was performed following the standard protocol  without IV contrast.  COMPARISON:  10/12/2013  FINDINGS: BODY WALL: Unremarkable.  LOWER CHEST: Dual-chamber pacer leads again noted.  No cardiomegaly.  ABDOMEN/PELVIS:  Liver: Unchanged appearance of left lobe atrophy.  Biliary: Intrahepatic biliary ductal dilation, new/ progressed from prior. No visible choledocholithiasis. Cholecystectomy.  Pancreas: Edematous and enlarged uncinate process and pancreatic head. There is peripancreatic adenopathy which has mildly increased from prior, presumably reactive. No fluid collection. No evidence of ductal dilation.  Spleen: Unremarkable.  Adrenals: Unremarkable.  Kidneys and ureters: Unchanged 2 cm soft tissue density mass exophytic from the lower pole right kidney. There is a water density (simple cysts) exophytic from the interpolar right kidney.  Bowel: No obstruction. Minimal imaging of the appendix unremarkable.  Retroperitoneum: No mass or adenopathy.  Peritoneum: No free fluid or gas.  Vascular: No acute abnormality.  OSSEOUS: No acute abnormalities.  IMPRESSION: 1. New intrahepatic biliary ductal dilation, concerning for obstruction given contemporaneous liver function tests. Consider MRCP followup. 2. Pancreatitis without fluid collection. 3. 2 cm right renal mass, primarily concerning for small renal cell carcinoma.   Electronically Signed   By: Tiburcio PeaJonathan  Watts M.D.   On: 11/02/2013 02:08    Medications: I have reviewed the patient's current medications.  Assessment/Plan: #1 Acute Pancreatitis: recurrent despite  conservative treatment with last admission. Will request GI consult for consideration of ERCP given hepatic duct dilatation and MRCP non-availability due to cardiac pacemaker. #2 Protein Calorie malnutrition: severe and will worsen with NPO status. Will defer to GI consultant timing of placement of panda tube for enteral feeding.  #3 Acute Renal Insufficiency: improved with IVF and likely due to dehydration  LOS: 1 day   Jarome MatinDaniel  Jayce Boyko 11/02/2013, 10:03 AM

## 2013-11-03 ENCOUNTER — Inpatient Hospital Stay (HOSPITAL_COMMUNITY): Payer: Medicare Other

## 2013-11-03 LAB — COMPREHENSIVE METABOLIC PANEL WITH GFR
ALT: 104 U/L — ABNORMAL HIGH (ref 0–53)
AST: 170 U/L — ABNORMAL HIGH (ref 0–37)
Albumin: 2.1 g/dL — ABNORMAL LOW (ref 3.5–5.2)
Alkaline Phosphatase: 867 U/L — ABNORMAL HIGH (ref 39–117)
BUN: 39 mg/dL — ABNORMAL HIGH (ref 6–23)
CO2: 25 meq/L (ref 19–32)
Calcium: 8.8 mg/dL (ref 8.4–10.5)
Chloride: 106 meq/L (ref 96–112)
Creatinine, Ser: 2.59 mg/dL — ABNORMAL HIGH (ref 0.50–1.35)
GFR calc Af Amer: 24 mL/min — ABNORMAL LOW
GFR calc non Af Amer: 21 mL/min — ABNORMAL LOW
Glucose, Bld: 90 mg/dL (ref 70–99)
Potassium: 4.4 meq/L (ref 3.7–5.3)
Sodium: 142 meq/L (ref 137–147)
Total Bilirubin: 6.5 mg/dL — ABNORMAL HIGH (ref 0.3–1.2)
Total Protein: 5.5 g/dL — ABNORMAL LOW (ref 6.0–8.3)

## 2013-11-03 LAB — CBC
HEMATOCRIT: 29.4 % — AB (ref 39.0–52.0)
HEMOGLOBIN: 9.6 g/dL — AB (ref 13.0–17.0)
MCH: 33.3 pg (ref 26.0–34.0)
MCHC: 32.7 g/dL (ref 30.0–36.0)
MCV: 102.1 fL — ABNORMAL HIGH (ref 78.0–100.0)
Platelets: 164 10*3/uL (ref 150–400)
RBC: 2.88 MIL/uL — AB (ref 4.22–5.81)
RDW: 16 % — ABNORMAL HIGH (ref 11.5–15.5)
WBC: 4.7 10*3/uL (ref 4.0–10.5)

## 2013-11-03 NOTE — Clinical Documentation Improvement (Signed)
  Clinical Indicators:   - "Acute Renal Insufficiency" documented in current medical record   - Creatinine at discharge previous admission 10/17/13 -  1.25   - Creatinine on admission 11/01/2013 -   3.21   - Increase in Creatinine from previous discharge to this admission -   1.96   - Serial Chemistries and IV Hydration   Possible Clinical Conditions:   - Acute on Chronic Renal Failure with a 1.96 mg/dl in Creatinine from previous discharge 10/17/13, improving   - Other Condition   - Unable to Clinically Determine  Thank You,  Jerral Ralphathy R Lulu Hirschmann ,RN BSN CCDS Certified Clinical Documentation Specialist:  [phone] [facility] Health Information Management

## 2013-11-03 NOTE — Consult Note (Signed)
Referring Provider: Dr. Dossie Arbour Primary Care Physician:  [name], MD Primary Gastroenterologist:  Dr. Dorena Cookey  Reason for Consultation:  Recurrent pancreatitis  HPI: [name] [name] is a 80 y.o. male readmitted to the hospital yesterday because of pancreatitis. He was admitted here on April 14 with a milder presentation, at which time lipase was around 1000, liver enzymes were minimally elevated (although moderately increased from his baseline of very low levels roughly 6 months earlier), and a CT showed mild peripancreatic stranding. At that time, a CA 19-9 tumor antigen was mildly elevated at 48.  The patient went home on a conservative diet but 48 hours ago, had the recurrence of significant epigastric pain and some nausea, but no vomiting. He had a couple of bowel movements but no ongoing diarrhea, some warm sweats but no definite fevers or shaking chills. He was admitted from his primary physician's office, and a repeat CT scan at this time shows new onset intrahepatic biliary ductal dilatation, and a more significant increase in liver enzymes, in the 200 range, and a lipase around 2000. The patient subjectively feels that this episode is much worse than the previous one several weeks earlier.  The patient is remotely status post cholecystectomy, roughly 2001.  In 2009, the patient had what was thought to be mild, idiopathic pancreatitis, at which time upper endoscopy was unrevealing.     Past Medical History  Diagnosis Date  . Osteoarthritis   . Gout   . Hypertension   . History of coronary artery disease   . GERD (gastroesophageal reflux disease)   . CHF (congestive heart failure)   . Diastolic dysfunction   . History of GI bleed   . History of pacemaker     Medtronic Adapta ADDro1  . Peptic ulcer disease   . BPH (benign prostatic hypertrophy)   . 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 Hyperlipidemia   . History of cerebrovascular disease   .  Kidney stone   . Depression   . Chest pain     Past Surgical History  Procedure Laterality Date  . Pacemaker insertion      Medtronic Adapta ADDro1  . Cholecystectomy    . Total knee arthroplasty    . Hernia repair    . Tonsillectomy    . Total knee arthroplasty  08/28/2011    Procedure: TOTAL KNEE ARTHROPLASTY;  Surgeon: Drucilla Schmidt, MD;  Location: [facility];  Service: Orthopedics;  Laterality: Left;    Prior to Admission medications   Medication Sig Start Date End Date Taking? Authorizing Provider  allopurinol (ZYLOPRIM) 300 MG tablet Take 300 mg by mouth daily.    Yes Historical Provider, MD  aspirin 81 MG tablet Take 81 mg by mouth every morning.    Yes Historical Provider, MD  atorvastatin (LIPITOR) 80 MG tablet Take 40 mg by mouth at bedtime.    Yes Historical Provider, MD  cholecalciferol (VITAMIN D) 1000 UNITS tablet Take 1,000 Units by mouth daily.   Yes Historical Provider, MD  finasteride (PROSCAR) 5 MG tablet Take 5 mg by mouth daily.    Yes Historical Provider, MD  furosemide (LASIX) 40 MG tablet Take 40 mg by mouth daily.   Yes Historical Provider, MD  losartan (COZAAR) 25 MG tablet Take 1 tablet (25 mg total) by mouth daily. 05/18/13  Yes Duke Salvia, MD  metoprolol (LOPRESSOR) 50 MG tablet Take 25 mg by mouth 2 (two) times daily.  Yes Historical Provider, MD  Multiple Vitamins-Minerals (OCUVITE EXTRA) TABS Take 2 tablets by mouth 2 (two) times daily.    Yes Historical Provider, MD  pantoprazole (PROTONIX) 40 MG tablet Take 40 mg by mouth 2 (two) times daily.    Yes Historical Provider, MD  polyethylene glycol powder (GLYCOLAX/MIRALAX) powder Take 17 g by mouth daily as needed for mild constipation (for constipation).    Yes Historical Provider, MD  sertraline (ZOLOFT) 100 MG tablet Take 100 mg by mouth every morning.   Yes Historical Provider, MD    Current Facility-Administered Medications  Medication Dose Route Frequency Provider Last Rate Last Dose  . 0.9 %   sodium chloride infusion   Intravenous Continuous Sunnie NielsenBrian Opitz, MD 125 mL/hr at 11/01/13 2346    . 0.9 %  sodium chloride infusion   Intravenous Continuous Julian HyStephen Alan South, MD 125 mL/hr at 11/03/13 1034    . HYDROmorphone (DILAUDID) injection 1 mg  1 mg Intravenous Q2H PRN Julian HyStephen Alan South, MD   1 mg at 11/03/13 0759  . ondansetron (ZOFRAN) injection 4 mg  4 mg Intravenous Q6H PRN Julian HyStephen Alan South, MD   4 mg at 11/03/13 0747  . pantoprazole (PROTONIX) injection 40 mg  40 mg Intravenous QHS Julian HyStephen Alan South, MD   40 mg at 11/02/13 2111    Allergies as of 11/01/2013  . (No Known Allergies)    Family History  Problem Relation Age of Onset  . Diabetes Mother   . Leukemia Father     History   Social History  . Marital Status: Married    Spouse Name: [name] [name]    Number of Children: [name] [name]  . Years of Education: [name] [name]   Occupational History  . Retired    Social History Main Topics  . Smoking status: Former Games developermoker  . Smokeless tobacco: Former NeurosurgeonUser    Quit date: 08/13/1976  . Alcohol Use: No  . Drug Use: No  . Sexual Activity: No   Other Topics Concern  . Not on file   Social History Narrative   Married for 20 years   2 children and 3 grandchildren    Review of Systems:   Physical Exam: Vital signs in last 24 hours: Temp:  [97.5 F (36.4 C)-98 F (36.7 C)] 98 F (36.7 C) (05/06 1433) Pulse Rate:  [77-96] 77 (05/06 1433) Resp:  [16-20] 20 (05/06 1433) BP: (105-149)/(34-80) 149/56 mmHg (05/06 1433) SpO2:  [96 %-100 %] 100 % (05/06 1433) Last BM Date: 11/02/13 This is a pleasant, well preserved Caucasian male, mild to moderately overweight, in no acute distress. He is anicteric and without pallor. The oropharynx is benign. The chest is clear. Heart has a 3/6 systolic murmur at the upper left sternal border, but no arrhythmia or gallop appreciated. The abdomen is diffusely tympanitic and moderately tender, with the main tenderness in the epigastric area, soft, no rigidity  or peritoneal findings, occasional bowel sounds present.   Intake/Output from previous day: 05/05 0701 - 05/06 0700 In: 3000 [I.V.:3000] Out: 550 [Urine:550] Intake/Output this shift:    Lab Results:  Recent Labs  11/01/13 2245 11/03/13 0328  WBC 7.0 4.7  HGB 11.3* 9.6*  HCT 32.6* 29.4*  PLT 207 164   BMET  Recent Labs  11/01/13 2245 11/02/13 0354 11/03/13 0328  NA 138 139 142  K 3.9 3.7 4.4  CL 93* 101 106  CO2 28 26 25   GLUCOSE 132* 117* 90  BUN 45* 40* 39*  CREATININE 3.21* 2.91*  2.59*  CALCIUM 9.5 8.7 8.8   LFT  Recent Labs  11/03/13 0328  PROT 5.5*  ALBUMIN 2.1*  AST 170*  ALT 104*  ALKPHOS 867*  BILITOT 6.5*   PT/INR No results found for this basename: LABPROT, INR,  in the last 72 hours  Studies/Results: Ct Abdomen Wo Contrast  11/02/2013   CLINICAL DATA:  Pancreatitis.  EXAM: CT ABDOMEN WITHOUT CONTRAST  TECHNIQUE: Multidetector CT imaging of the abdomen was performed following the standard protocol without IV contrast.  COMPARISON:  10/12/2013  FINDINGS: BODY WALL: Unremarkable.  LOWER CHEST: Dual-chamber pacer leads again noted.  No cardiomegaly.  ABDOMEN/PELVIS:  Liver: Unchanged appearance of left lobe atrophy.  Biliary: Intrahepatic biliary ductal dilation, new/ progressed from prior. No visible choledocholithiasis. Cholecystectomy.  Pancreas: Edematous and enlarged uncinate process and pancreatic head. There is peripancreatic adenopathy which has mildly increased from prior, presumably reactive. No fluid collection. No evidence of ductal dilation.  Spleen: Unremarkable.  Adrenals: Unremarkable.  Kidneys and ureters: Unchanged 2 cm soft tissue density mass exophytic from the lower pole right kidney. There is a water density (simple cysts) exophytic from the interpolar right kidney.  Bowel: No obstruction. Minimal imaging of the appendix unremarkable.  Retroperitoneum: No mass or adenopathy.  Peritoneum: No free fluid or gas.  Vascular: No acute  abnormality.  OSSEOUS: No acute abnormalities.  IMPRESSION: 1. New intrahepatic biliary ductal dilation, concerning for obstruction given contemporaneous liver function tests. Consider MRCP followup. 2. Pancreatitis without fluid collection. 3. 2 cm right renal mass, primarily concerning for small renal cell carcinoma.   Electronically Signed   By: Tiburcio PeaJonathan  Watts M.D.   On: 11/02/2013 02:08    Impression: Recurrent pancreatitis. I wonder about choledocholithiasis, now with new onset elevation of liver chemistries and intrahepatic biliary ductal dilatation.  Plan: Abdominal ultrasound tomorrow. Continue supportive care and n.p.o. Consider endoscopic ultrasound to check for choledocholithiasis if standard ultrasound is negative. (Patient is not a candidate for MRCP, because he has a pacemaker in place.) If common duct stones are confirmed, ERCP would be the next step.   LOS: 2 days   Katy FitchRobert V Ameena Vesey  11/03/2013, 5:20 PM

## 2013-11-03 NOTE — Progress Notes (Signed)
Subjective: He continues to have mild to moderate epigastric pain and nausea, no dyspnea  Objective: Vital signs in last 24 hours: Temp:  [97.5 F (36.4 C)-97.8 F (36.6 C)] 97.5 F (36.4 C) (05/06 0418) Pulse Rate:  [80-96] 82 (05/06 0418) Resp:  [16-20] 20 (05/06 0418) BP: (105-119)/(34-80) 105/34 mmHg (05/06 0418) SpO2:  [96 %-99 %] 99 % (05/06 0418) Weight change:    Intake/Output from previous day: 05/05 0701 - 05/06 0700 In: 3000 [I.V.:3000] Out: 550 [Urine:550]   General appearance: alert, cooperative and no distress Resp: clear to auscultation bilaterally Cardio: regular rate and rhythm GI: bowel sounds slightly reduced with mild epigastric tenderness without rebound Extremities: bilateral 1+ leg edema (left greater than right)  Lab Results:  Recent Labs  11/01/13 2245 11/03/13 0328  WBC 7.0 4.7  HGB 11.3* 9.6*  HCT 32.6* 29.4*  PLT 207 164   BMET  Recent Labs  11/02/13 0354 11/03/13 0328  NA 139 142  K 3.7 4.4  CL 101 106  CO2 26 25  GLUCOSE 117* 90  BUN 40* 39*  CREATININE 2.91* 2.59*  CALCIUM 8.7 8.8   CMET CMP     Component Value Date/Time   NA 142 11/03/2013 0328   K 4.4 11/03/2013 0328   CL 106 11/03/2013 0328   CO2 25 11/03/2013 0328   GLUCOSE 90 11/03/2013 0328   BUN 39* 11/03/2013 0328   CREATININE 2.59* 11/03/2013 0328   CALCIUM 8.8 11/03/2013 0328   PROT 5.5* 11/03/2013 0328   ALBUMIN 2.1* 11/03/2013 0328   AST 170* 11/03/2013 0328   ALT 104* 11/03/2013 0328   ALKPHOS 867* 11/03/2013 0328   BILITOT 6.5* 11/03/2013 0328   GFRNONAA 21* 11/03/2013 0328   GFRAA 24* 11/03/2013 0328    CBG (last 3)  No results found for this basename: GLUCAP,  in the last 72 hours  INR RESULTS:   Lab Results  Component Value Date   INR 1.06 01/15/2013   INR 0.96 08/14/2011   INR 1.0 08/18/2007     Studies/Results: Ct Abdomen Wo Contrast  11/02/2013   CLINICAL DATA:  Pancreatitis.  EXAM: CT ABDOMEN WITHOUT CONTRAST  TECHNIQUE: Multidetector CT imaging of the abdomen  was performed following the standard protocol without IV contrast.  COMPARISON:  10/12/2013  FINDINGS: BODY WALL: Unremarkable.  LOWER CHEST: Dual-chamber pacer leads again noted.  No cardiomegaly.  ABDOMEN/PELVIS:  Liver: Unchanged appearance of left lobe atrophy.  Biliary: Intrahepatic biliary ductal dilation, new/ progressed from prior. No visible choledocholithiasis. Cholecystectomy.  Pancreas: Edematous and enlarged uncinate process and pancreatic head. There is peripancreatic adenopathy which has mildly increased from prior, presumably reactive. No fluid collection. No evidence of ductal dilation.  Spleen: Unremarkable.  Adrenals: Unremarkable.  Kidneys and ureters: Unchanged 2 cm soft tissue density mass exophytic from the lower pole right kidney. There is a water density (simple cysts) exophytic from the interpolar right kidney.  Bowel: No obstruction. Minimal imaging of the appendix unremarkable.  Retroperitoneum: No mass or adenopathy.  Peritoneum: No free fluid or gas.  Vascular: No acute abnormality.  OSSEOUS: No acute abnormalities.  IMPRESSION: 1. New intrahepatic biliary ductal dilation, concerning for obstruction given contemporaneous liver function tests. Consider MRCP followup. 2. Pancreatitis without fluid collection. 3. 2 cm right renal mass, primarily concerning for small renal cell carcinoma.   Electronically Signed   By: Tiburcio PeaJonathan  Watts M.D.   On: 11/02/2013 02:08    Medications: I have reviewed the patient's current medications.  Assessment/Plan: #1 Pancreatitis:  clinically stable and we will request a GI consultation today. #2 Anemia: mild to moderate and asymptomatic and we'll continue to monitor his hemoglobin and hematocrit. #3 Protein calorie malnutrition: Severe and we will initiate enteral feeding by Panda tube when the GI consultant recommends placement of the tube.   LOS: 2 days   Jarome MatinDaniel Arty Lantzy 11/03/2013, 8:30 AM

## 2013-11-04 ENCOUNTER — Ambulatory Visit: Payer: Medicare Other | Admitting: Cardiology

## 2013-11-04 ENCOUNTER — Encounter (HOSPITAL_COMMUNITY)
Admission: EM | Disposition: A | Discharge status: Home Health | Payer: Self-pay | Source: Home / Self Care | Attending: Internal Medicine

## 2013-11-04 ENCOUNTER — Encounter (HOSPITAL_COMMUNITY): Payer: Self-pay

## 2013-11-04 HISTORY — PX: EUS: SHX5427

## 2013-11-04 LAB — COMPREHENSIVE METABOLIC PANEL
ALK PHOS: 949 U/L — AB (ref 39–117)
ALT: 84 U/L — ABNORMAL HIGH (ref 0–53)
AST: 153 U/L — ABNORMAL HIGH (ref 0–37)
Albumin: 2 g/dL — ABNORMAL LOW (ref 3.5–5.2)
BILIRUBIN TOTAL: 6.8 mg/dL — AB (ref 0.3–1.2)
BUN: 35 mg/dL — AB (ref 6–23)
CHLORIDE: 111 meq/L (ref 96–112)
CO2: 24 mEq/L (ref 19–32)
Calcium: 8.8 mg/dL (ref 8.4–10.5)
Creatinine, Ser: 2.11 mg/dL — ABNORMAL HIGH (ref 0.50–1.35)
GFR calc non Af Amer: 27 mL/min — ABNORMAL LOW (ref >90)
GFR, EST AFRICAN AMERICAN: 31 mL/min — AB (ref >90)
GLUCOSE: 90 mg/dL (ref 70–99)
POTASSIUM: 4.7 meq/L (ref 3.7–5.3)
Sodium: 145 mEq/L (ref 137–147)
Total Protein: 5.5 g/dL — ABNORMAL LOW (ref 6.0–8.3)

## 2013-11-04 LAB — LIPASE, BLOOD: Lipase: 1910 U/L — ABNORMAL HIGH (ref 11–59)

## 2013-11-04 SURGERY — UPPER ENDOSCOPIC ULTRASOUND (EUS) LINEAR
Anesthesia: Moderate Sedation

## 2013-11-04 MED ORDER — SODIUM CHLORIDE 0.9 % IV SOLN
INTRAVENOUS | Status: DC
  Administered 2013-11-04: 500 mL via INTRAVENOUS

## 2013-11-04 MED ORDER — BUTAMBEN-TETRACAINE-BENZOCAINE 2-2-14 % EX AERO
INHALATION_SPRAY | CUTANEOUS | Status: DC | PRN
  Administered 2013-11-04: 2 via TOPICAL

## 2013-11-04 MED ORDER — DIPHENHYDRAMINE HCL 50 MG/ML IJ SOLN
INTRAMUSCULAR | Status: AC
  Filled 2013-11-04: qty 1

## 2013-11-04 MED ORDER — FENTANYL CITRATE 0.05 MG/ML IJ SOLN
INTRAMUSCULAR | Status: AC
  Filled 2013-11-04: qty 2

## 2013-11-04 MED ORDER — FENTANYL CITRATE 0.05 MG/ML IJ SOLN
INTRAMUSCULAR | Status: DC | PRN
  Administered 2013-11-04 (×2): 25 ug via INTRAVENOUS

## 2013-11-04 MED ORDER — MIDAZOLAM HCL 10 MG/2ML IJ SOLN
INTRAMUSCULAR | Status: AC
  Filled 2013-11-04: qty 2

## 2013-11-04 MED ORDER — MIDAZOLAM HCL 10 MG/2ML IJ SOLN
INTRAMUSCULAR | Status: DC | PRN
  Administered 2013-11-04 (×2): 1 mg via INTRAVENOUS
  Administered 2013-11-04: 2 mg via INTRAVENOUS

## 2013-11-04 NOTE — Progress Notes (Signed)
Subjective: Still some epigastric pain.  Hasn't been able to eat for a few days.  Objective: Vital signs in last 24 hours: Temp:  [97.9 F (36.6 C)-98.1 F (36.7 C)] 98.1 F (36.7 C) (05/07 0500) Pulse Rate:  [77-81] 79 (05/07 0500) Resp:  [16-20] 18 (05/07 0500) BP: (116-149)/(56-60) 116/60 mmHg (05/07 0500) SpO2:  [100 %] 100 % (05/07 0500) Weight change:  Last BM Date: 11/02/13  PE: GEN:  NAD ABD:  Mild to moderate epigastric tenderness, mild distention, hypoactive bowel sounds  Lab Results: CMP     Component Value Date/Time   NA 145 11/04/2013 0335   K 4.7 11/04/2013 0335   CL 111 11/04/2013 0335   CO2 24 11/04/2013 0335   GLUCOSE 90 11/04/2013 0335   BUN 35* 11/04/2013 0335   CREATININE 2.11* 11/04/2013 0335   CALCIUM 8.8 11/04/2013 0335   PROT 5.5* 11/04/2013 0335   ALBUMIN 2.0* 11/04/2013 0335   AST 153* 11/04/2013 0335   ALT 84* 11/04/2013 0335   ALKPHOS 949* 11/04/2013 0335   BILITOT 6.8* 11/04/2013 0335   GFRNONAA 27* 11/04/2013 0335   GFRAA 31* 11/04/2013 0335   No lipase today  CBC    Component Value Date/Time   WBC 4.7 11/03/2013 0328   RBC 2.88* 11/03/2013 0328   HGB 9.6* 11/03/2013 0328   HCT 29.4* 11/03/2013 0328   PLT 164 11/03/2013 0328   MCV 102.1* 11/03/2013 0328   MCH 33.3 11/03/2013 0328   MCHC 32.7 11/03/2013 0328   RDW 16.0* 11/03/2013 0328   LYMPHSABS 0.6* 11/01/2013 2245   MONOABS 0.7 11/01/2013 2245   EOSABS 0.0 11/01/2013 2245   BASOSABS 0.0 11/01/2013 2245   Assessment:  1.  Pancreatitis with elevated LFTs, suspected bile duct stone-related. 2.  Epigastric pain, persistent, likely due to #1 above.  Plan:  1.  Endoscopic ultrasound today (not MRCP candidate due to pacemaker and renal insufficiency). 2.  If choledocholithiasis is seen, we'll have to determine ideal time to pursue ERCP (wouldn't do right now, since he has symptoms of pancreatitis). 3.  Check lipase today. 4.  Continue medical management for pancreatitis; regardless of when ERCP is done, patient needs nutrition,  and if ERCP is delayed, might consider short-term nasojejunal tube feeds.   [name] [name] 11/04/2013, 9:08 AM   

## 2013-11-04 NOTE — Op Note (Signed)
[facility] [address] [place], [id number]   ENDOSCOPIC ULTRASOUND PROCEDURE REPORT  PATIENT: [name] [name], [name] E.  MR#: [id number] BIRTHDATE: [birth year]  GENDER: Male ENDOSCOPIST: Willis ModenaWilliam Khristian Phillippi, MD REFERRED BY:  Ivery Qualeaniel Patterson, M.D. PROCEDURE DATE:  11/04/2013 PROCEDURE:   Upper EUS ASA CLASS:      Class III INDICATIONS:   1.  pancreatitis, elevated LFTs. MEDICATIONS: Fentanyl 50 mcg IV, Versed 4 mg IV, Cetacaine spray x 2   DESCRIPTION OF PROCEDURE:   After the risks benefits and alternatives of the procedure were  explained, informed consent was obtained. The patient was then placed in the left, lateral, decubitus postion and IV sedation was administered. Throughout the procedure, the patients blood pressure, pulse and oxygen saturations were monitored continuously.  Under direct visualization, the     endoscope was introduced through the mouth and advanced to the second portion of the duodenum .  Water was used as necessary to provide an acoustic interface.  Upon completion of the imaging, water was removed and the patient was sent to the recovery room in satisfactory condition.    FINDINGS:      Extensive pancreatic inflammatory changes throughout the pancreas, but worse in the head and uncinate.  Prominent pancreatic duct at 3mm.  CBD was about 8mm in diameter, with reactive-appearing symmetrical bile duct wall thickening.  Patient is post-cholecystectomy.  The bile duct significantly tapers within the intrapancreatic segment, suggestive of possible acute inflammatory stricture.  No obvious mass seen in head or uncinate pancreas, but patient's extensive pancreatic inflammatory changes makes definitively ruling out subtle mass lesion difficult. Ampulla showed inflammatory changes but no evidence of ampullary stone identified.  No bile duct stone seen.  IMPRESSION:     Acute pancreatitis, unclear etiology, but passed CBD stone seems a consideration.  No obvious bile duct stone or ampullary stone was seen. Patient's current LFT elevation, however, seems most likely reflective of acute pancreatitis-related biliary stricture.  No obvious pancreatic mass, with caveat of limitations of EUS in this setting as above.  RECOMMENDATIONS:     1.  Watch for potential complications of procedure. 2.  Sips clear liquids. 3.  Patient will very well likely require temporary nasoenteric feeds (nasojejunal feeding tube); will revisit with patient tomorrow. 4.  Given patient's ongoing pancreatitis, based on ongoing abdominal pain and inflammation seen in pancreas on todays exam and his lipase of 1900, I would be very reluctant to pursue ERCP at the present time. 5.  [facility] will follow.   _______________________________ Rosalie DoctoreSignedWillis Modena:  Lakaisha Danish, MD 11/04/2013 4:29 PM   CC:

## 2013-11-04 NOTE — H&P (View-Only) (Signed)
Subjective: Still some epigastric pain.  Hasn't been able to eat for a few days.  Objective: Vital signs in last 24 hours: Temp:  [97.9 F (36.6 C)-98.1 F (36.7 C)] 98.1 F (36.7 C) (05/07 0500) Pulse Rate:  [77-81] 79 (05/07 0500) Resp:  [16-20] 18 (05/07 0500) BP: (116-149)/(56-60) 116/60 mmHg (05/07 0500) SpO2:  [100 %] 100 % (05/07 0500) Weight change:  Last BM Date: 11/02/13  PE: GEN:  NAD ABD:  Mild to moderate epigastric tenderness, mild distention, hypoactive bowel sounds  Lab Results: CMP     Component Value Date/Time   NA 145 11/04/2013 0335   K 4.7 11/04/2013 0335   CL 111 11/04/2013 0335   CO2 24 11/04/2013 0335   GLUCOSE 90 11/04/2013 0335   BUN 35* 11/04/2013 0335   CREATININE 2.11* 11/04/2013 0335   CALCIUM 8.8 11/04/2013 0335   PROT 5.5* 11/04/2013 0335   ALBUMIN 2.0* 11/04/2013 0335   AST 153* 11/04/2013 0335   ALT 84* 11/04/2013 0335   ALKPHOS 949* 11/04/2013 0335   BILITOT 6.8* 11/04/2013 0335   GFRNONAA 27* 11/04/2013 0335   GFRAA 31* 11/04/2013 0335   No lipase today  CBC    Component Value Date/Time   WBC 4.7 11/03/2013 0328   RBC 2.88* 11/03/2013 0328   HGB 9.6* 11/03/2013 0328   HCT 29.4* 11/03/2013 0328   PLT 164 11/03/2013 0328   MCV 102.1* 11/03/2013 0328   MCH 33.3 11/03/2013 0328   MCHC 32.7 11/03/2013 0328   RDW 16.0* 11/03/2013 0328   LYMPHSABS 0.6* 11/01/2013 2245   MONOABS 0.7 11/01/2013 2245   EOSABS 0.0 11/01/2013 2245   BASOSABS 0.0 11/01/2013 2245   Assessment:  1.  Pancreatitis with elevated LFTs, suspected bile duct stone-related. 2.  Epigastric pain, persistent, likely due to #1 above.  Plan:  1.  Endoscopic ultrasound today (not MRCP candidate due to pacemaker and renal insufficiency). 2.  If choledocholithiasis is seen, we'll have to determine ideal time to pursue ERCP (wouldn't do right now, since he has symptoms of pancreatitis). 3.  Check lipase today. 4.  Continue medical management for pancreatitis; regardless of when ERCP is done, patient needs nutrition,  and if ERCP is delayed, might consider short-term nasojejunal tube feeds.   Willis ModenaWilliam Janeane Cozart 11/04/2013, 9:08 AM

## 2013-11-04 NOTE — Interval H&P Note (Signed)
History and Physical Interval Note:  11/04/2013 3:46 PM  [name] [name]  has presented today for surgery, with the diagnosis of elevated LFT , pancreatitis  The various methods of treatment have been discussed with the patient and family. After consideration of risks, benefits and other options for treatment, the patient has consented to  Procedure(s): UPPER ENDOSCOPIC ULTRASOUND (EUS) LINEAR (N/A) as a surgical intervention .  The patient's history has been reviewed, patient examined, no change in status, stable for surgery.  I have reviewed the patient's chart and labs.  Questions were answered to the patient's satisfaction.     [name] ModenaWilliam Aquilla [name]  Assessment:  1.  Pancreatitis, persistent, lipase today 1900. 2.  Elevated LFTs.  Plan:  1.  Endoscopic ultrasound. 2.  Risks (bleeding, infection, bowel perforation that could require surgery, sedation-related changes in cardiopulmonary systems), benefits (identification and possible treatment of source of symptoms, exclusion of certain causes of symptoms), and alternatives (watchful waiting, radiographic imaging studies, empiric medical treatment) of upper endoscopy with ultrasound (EUS) were explained to patient/family in detail and patient wishes to proceed.

## 2013-11-04 NOTE — Progress Notes (Signed)
Subjective: Still has intermittent mild epigastric pain, no BMs for several days, no dyspnea  Objective: Vital signs in last 24 hours: Temp:  [97.9 F (36.6 C)-98.1 F (36.7 C)] 98.1 F (36.7 C) (05/07 0500) Pulse Rate:  [77-81] 79 (05/07 0500) Resp:  [16-20] 18 (05/07 0500) BP: (116-149)/(56-60) 116/60 mmHg (05/07 0500) SpO2:  [100 %] 100 % (05/07 0500) Weight change:    Intake/Output from previous day: 05/06 0701 - 05/07 0700 In: 1875 [I.V.:1875] Out: -    General appearance: alert, cooperative and no distress Resp: clear to auscultation bilaterally Cardio: regular rate and rhythm GI: bowel sounds decreased, mild distension with diffuse tympany, very mild epigastric tenderness Extremities: extremities normal, atraumatic, no cyanosis or edema  Lab Results:  Recent Labs  11/01/13 2245 11/03/13 0328  WBC 7.0 4.7  HGB 11.3* 9.6*  HCT 32.6* 29.4*  PLT 207 164   BMET  Recent Labs  11/03/13 0328 11/04/13 0335  NA 142 145  K 4.4 4.7  CL 106 111  CO2 25 24  GLUCOSE 90 90  BUN 39* 35*  CREATININE 2.59* 2.11*  CALCIUM 8.8 8.8   CMET CMP     Component Value Date/Time   NA 145 11/04/2013 0335   K 4.7 11/04/2013 0335   CL 111 11/04/2013 0335   CO2 24 11/04/2013 0335   GLUCOSE 90 11/04/2013 0335   BUN 35* 11/04/2013 0335   CREATININE 2.11* 11/04/2013 0335   CALCIUM 8.8 11/04/2013 0335   PROT 5.5* 11/04/2013 0335   ALBUMIN 2.0* 11/04/2013 0335   AST 153* 11/04/2013 0335   ALT 84* 11/04/2013 0335   ALKPHOS 949* 11/04/2013 0335   BILITOT 6.8* 11/04/2013 0335   GFRNONAA 27* 11/04/2013 0335   GFRAA 31* 11/04/2013 0335    CBG (last 3)  No results found for this basename: GLUCAP,  in the last 72 hours  INR RESULTS:   Lab Results  Component Value Date   INR 1.06 01/15/2013   INR 0.96 08/14/2011   INR 1.0 08/18/2007     Studies/Results: [place] Abdomen Complete  11/03/2013   CLINICAL DATA:  Elevated LFTs, pancreatitis, question choledocholithiasis; abnormal CT with intrahepatic biliary  dilatation; past history hypertension, coronary artery disease, GERD, CHF, peptic ulcer disease, kidney stones  EXAM: ULTRASOUND ABDOMEN COMPLETE  COMPARISON:  CT abdomen 11/02/2013  FINDINGS: Gallbladder:  Surgically absent  Common bile duct:  Diameter: 7 mm diameter, which can be normal post cholecystectomy. Mid to distal CBD obscured by bowel gas, unable to visualize to the pancreas.  Liver:  Mildly inhomogeneous echogenicity. Intrahepatic biliary dilatation. No gross evidence of hepatic mass though intrahepatic assessment is suboptimal due to bowel gas.  IVC:  Unable to visualize due to bowel gas.  Pancreas:  Obscured by bowel gas.  Spleen:  Normal appearance, 4.9 cm length  Right Kidney:  Length: 11.2 cm. Cortical thinning. Borderline increased cortical echogenicity. Exophytic lesion at inferior pole, complex hypoechoic, 1.5 x 2.2 x 1.4 cm. Additional cyst with simple features lateral RIGHT kidney 3.1 x 3.5 x 3.2 cm. No hydronephrosis or shadowing calcifications.  Left Kidney:  Length: 11.4 cm. Cortical thinning. Upper normal cortical echogenicity. No gross mass or hydronephrosis. No definite shadowing calcification.  Abdominal aorta:  Obscured by bowel gas  Other findings:  No free-fluid  IMPRESSION: Post cholecystectomy.  Limited exam secondary to bowel gas.  Intrahepatic biliary dilatation of uncertain etiology, unable to assess distal CBD and pancreatic head due to bowel gas.  Simple cyst mid RIGHT kidney with  an additional complex hypoechoic lesion 1.5 x 2.2 x 1.4 cm at inferior pole ; this lesion is complex by CT but unable to assess by MR due to pacemaker.  Renal cortical atrophy bilaterally.   Electronically Signed   By: Ulyses SouthwardMark  Boles M.D.   On: 11/03/2013 21:38    Medications: I have reviewed the patient's current medications.  Assessment/Plan: #1 Pancreatitis: clinically stable with suboptimal abdominal US due to bowel gas. I appreciate input from Dr. Matthias HughsBuccini, who will likely due an endoscopic US  to assess pancrease and hepatobiliary tree better, with possible placement of panda tube. #2 Acute on Chronic Kidney Disease: due to dehydration and serum creatinine level is slowly improving with IVF. #3 Anemia: stable #4 Renal Lesion: unclear cause and will have urologist evaluate in the future.   LOS: 3 days   [name] [name] 11/04/2013, 7:33 AM

## 2013-11-04 NOTE — Progress Notes (Signed)
Pt BP 126/47 HR 76. Oxygen saturations 99% on RA.  Asymptomatic.  MD notified.  Will continue to monitor.

## 2013-11-04 NOTE — Clinical Documentation Improvement (Signed)
  Please clarify "Acute on Chronic Kidney Disease" documented in progress note 11/04/13  Possible clarifications:   - Acute Kidney Injury on Stage III CKD   - Other Clarification   - Unable to Clinically Determine   Thank You,  Jerral Ralphathy R Leiann Sporer ,RN BSN CCDS Certified Clinical Documentation Specialist:  [phone] [facility]

## 2013-11-05 ENCOUNTER — Encounter (HOSPITAL_COMMUNITY): Payer: Self-pay | Admitting: Gastroenterology

## 2013-11-05 ENCOUNTER — Inpatient Hospital Stay (HOSPITAL_COMMUNITY): Payer: Medicare Other

## 2013-11-05 LAB — COMPREHENSIVE METABOLIC PANEL
ALT: 85 U/L — AB (ref 0–53)
AST: 166 U/L — ABNORMAL HIGH (ref 0–37)
Albumin: 2.1 g/dL — ABNORMAL LOW (ref 3.5–5.2)
Alkaline Phosphatase: 1166 U/L — ABNORMAL HIGH (ref 39–117)
BUN: 29 mg/dL — ABNORMAL HIGH (ref 6–23)
CALCIUM: 8.9 mg/dL (ref 8.4–10.5)
CO2: 21 meq/L (ref 19–32)
Chloride: 109 mEq/L (ref 96–112)
Creatinine, Ser: 1.73 mg/dL — ABNORMAL HIGH (ref 0.50–1.35)
GFR calc Af Amer: 39 mL/min — ABNORMAL LOW (ref >90)
GFR calc non Af Amer: 34 mL/min — ABNORMAL LOW (ref >90)
Glucose, Bld: 105 mg/dL — ABNORMAL HIGH (ref 70–99)
Potassium: 4.1 mEq/L (ref 3.7–5.3)
SODIUM: 142 meq/L (ref 137–147)
Total Bilirubin: 8.2 mg/dL — ABNORMAL HIGH (ref 0.3–1.2)
Total Protein: 5.7 g/dL — ABNORMAL LOW (ref 6.0–8.3)

## 2013-11-05 MED ORDER — PRO-STAT SUGAR FREE PO LIQD
30.0000 mL | Freq: Two times a day (BID) | ORAL | Status: DC
  Administered 2013-11-09: 30 mL
  Filled 2013-11-05 (×13): qty 30

## 2013-11-05 MED ORDER — GLUCERNA 1.0 CAL/FIBER PO LIQD
1000.0000 mL | ORAL | Status: DC
  Administered 2013-11-06: 1000 mL
  Filled 2013-11-05 (×10): qty 1000

## 2013-11-05 MED ORDER — PEDIASURE 1.0 CAL/FIBER PO LIQD: 1000.0000 mL | ORAL | Status: DC

## 2013-11-05 NOTE — Progress Notes (Signed)
Subjective: Ongoing mild epigastric pain.  Objective: Vital signs in last 24 hours: Temp:  [97.8 F (36.6 C)-98.2 F (36.8 C)] 97.9 F (36.6 C) (05/08 0427) Pulse Rate:  [31-84] 75 (05/08 0427) Resp:  [15-20] 18 (05/08 0427) BP: (126-173)/(47-117) 143/67 mmHg (05/08 0427) SpO2:  [78 %-100 %] 99 % (05/08 0427) Weight change:  Last BM Date: 11/03/13  PE: GEN:  NAD, jaundiced HEENT:  Sclera icteric, some conjunctival edema ABD:  Protuberant, mild epigastric tenderness, hypoactive bowel sounds  Lab Results: CMP     Component Value Date/Time   NA 142 11/05/2013 0415   K 4.1 11/05/2013 0415   CL 109 11/05/2013 0415   CO2 21 11/05/2013 0415   GLUCOSE 105* 11/05/2013 0415   BUN 29* 11/05/2013 0415   CREATININE 1.73* 11/05/2013 0415   CALCIUM 8.9 11/05/2013 0415   PROT 5.7* 11/05/2013 0415   ALBUMIN 2.1* 11/05/2013 0415   AST 166* 11/05/2013 0415   ALT 85* 11/05/2013 0415   ALKPHOS 1166* 11/05/2013 0415   BILITOT 8.2* 11/05/2013 0415   GFRNONAA 34* 11/05/2013 0415   GFRAA 39* 11/05/2013 0415   CBC    Component Value Date/Time   WBC 4.7 11/03/2013 0328   RBC 2.88* 11/03/2013 0328   HGB 9.6* 11/03/2013 0328   HCT 29.4* 11/03/2013 0328   PLT 164 11/03/2013 0328   MCV 102.1* 11/03/2013 0328   MCH 33.3 11/03/2013 0328   MCHC 32.7 11/03/2013 0328   RDW 16.0* 11/03/2013 0328   LYMPHSABS 0.6* 11/01/2013 2245   MONOABS 0.7 11/01/2013 2245   EOSABS 0.0 11/01/2013 2245   BASOSABS 0.0 11/01/2013 2245   Lipase 5/7 1910  Assessment:  1.  Pancreatitis.  Ongoing.  I suspect patient has passed bile duct stone, and has secondary biliary stricturing. 2.  Elevated LFTs, obstructive pattern.  Suspect from pancreatitis.  No obvious bile duct stone or mass was seen on yesterdays endoscopic ultrasound.  And while pancreatic cancer is unusual as a cause of pancreatitis, it is not unheard of.  The ability of EUS to detect a pancreatic mass in setting of profound inflammatory changes in the pancreas (such as from pancreatitis) is rather  limited. 3.  Malnutrition, feeding difficulties.  Plan:  1.  Agree with intravenous fluids and volume repletion; renal insufficiency seems to be improving with this. 2.  Agree with nasojejunal tube feeds, for the time-being. 3.  My hope is that by nutritively bypassing the pancreas (via nasojejunal tube feeds), the patient's pancreatitis and pancreatic edema will improve and hopefully patient's liver tests will improve. 4.  Would watch patient's liver tests and clinical course over the weekend.  If the patient's liver tests continue to significantly rise over the weekend, will need to consider ERCP early next week.  However, I am fairly reluctant to consider this at the present time, given patient's ongoing clinical, radiographic and laboratory studies consistent with ongoing pancreatitis. 5.  Regardless of patient's clinical course, one might consider MRI/MRCP sometime in the near future, hopefully once some of the patient's pancreatitis changes improve, to better delineate and exclude the possibility of latent pancreatic or bile duct malignancy. 6.  Will follow.  Willis ModenaWilliam Bartlomiej Jenkinson 11/05/2013, 9:26 AM

## 2013-11-05 NOTE — Progress Notes (Addendum)
NUTRITION FOLLOW UP  Intervention:   -Recommend Initiate Glucerna 1.0 @ 20 ml/hr via NJT and increase by 10 ml every 8 hours to goal rate of 80 ml/hr. 30 ml Prostat BID.  At goal rate, tube feeding regimen will provide 1400 kcal (80% est kcal needs), 80 grams of protein (76% est kcal needs), and 1024 ml of H2O.  -If IVF d/c'd, pt would need additional 175 ml flush Q6H -Recommend monitor refeeding labs- K/Mg/Phos  -Will continue to monitor  Nutrition Dx:   Inadequate oral intake related to inability to eat as evidenced by NPO-ongoing, to improve with initiation of TF    Goal:   Advance diet as tolerated to low fat diet -umet   Monitor:   TF tolerance, total protein/energy intake, diet advancement, weight, labs  Assessment:   5/05: Pt with hx of recent pancreatitis, admitted with abdominal pain that started yesterday. Pt discussed during multidisciplinary rounds.  -Met with pt who c/o lack of hunger for the past 3 weeks  -Spent the past 7 days consuming 20-30oz of Gatorade, did not eat anything  -Thinks he's lost 12 pounds in the past 3 weeks  -Before previous admission he was eating whatever he wanted to, 3 meals/day, denies any specific high fat foods he regularly consumed  -C/o diarrhea that started yesterday afternoon and denied any vomiting but did c/o nausea in the past week  -Noted MD discussed with pt possible post-pyloric TF, awaiting GI evaluation  BUN/Cr elevated with low GFR  Alk phos, lipase, AST/ALT, and total bilirubin elevated   5/08: -Pt to have panda tube placed on 5/08 and to initiate nasojejunal tube feds -Glucerna 1.0 at 50 ml/hr has been ordered. This regimen will provide 1200 kcal (69% est kcal needs), and 50 gram protein (48% est protein needs) -Would benefit from addition of Pro-Stat 30 ml BID to meet at least 75% estimated nutrition needs -No orders have been placed outlining TF advancement. -Consider modifying to elemental formula (Vital 1.2) if pt  exhibits intolerance, diarrhea or steatorrhea -Continue to recommend slow advancement for pt's risk of refeeding -Elevated LFT -K WNL -BUN/Crt elevated d/t AKI on CKD, improving with IVF. Receiving approx 3L fluid/day  Height: Ht Readings from Last 1 Encounters:  11/02/13 5' 8.5" (1.74 m)    Weight Status:   Wt Readings from Last 1 Encounters:  11/02/13 180 lb 11.2 oz (81.965 kg)    Re-estimated needs:  Kcal: 1750-1950  Protein: 105g  Fluid: 1.7-1.9L/day   Skin: WDL-jaundice  Diet Order: NPO   Intake/Output Summary (Last 24 hours) at 11/05/13 1104 Last data filed at 11/05/13 0600  Gross per 24 hour  Intake 3855.83 ml  Output    700 ml  Net 3155.83 ml    Last BM: 5/06   Labs:   Recent Labs Lab 11/03/13 0328 11/04/13 0335 11/05/13 0415  NA 142 145 142  K 4.4 4.7 4.1  CL 106 111 109  CO2 _0 BUN 39* 35* 29*  CREATININE 2.59* 2.11* 1.73*  CALCIUM 8.8 8.8 8.9  GLUCOSE 90 90 105*    CBG (last 3)  No results found for this basename: GLUCAP,  in the last 72 hours  Scheduled Meds: . pantoprazole (PROTONIX) IV  40 mg Intravenous QHS    Continuous Infusions: . sodium chloride 125 mL/hr at 11/04/13 0138  . sodium chloride 125 mL/hr at 11/04/13 1657  . GLUCERNA 1.0 CAL/FIBER      Atlee Abide MS RD LDN Clinical  Dietitian Pager:[phone]

## 2013-11-05 NOTE — Progress Notes (Signed)
Pt has had mostly uneventful night. Pt had two bouts of pain in which he received dilaudid. Pt immediatly fell asleep after and has only called out for bathroom needs. Will continue to monitor.

## 2013-11-05 NOTE — Progress Notes (Signed)
Per radiology, we are not to start tube feedings until panda tube has advanced to the second part of the duodenum. Peristalsis will advance per radiology. Will place order for DG 1 view to verify placement at 1900 per radiology.

## 2013-11-05 NOTE — Progress Notes (Signed)
Subjective: Continues to have mild epigastic pain, no nausea or dyspnea  Objective: Vital signs in last 24 hours: Temp:  [97.8 F (36.6 C)-98.2 F (36.8 C)] 97.9 F (36.6 C) (05/08 0427) Pulse Rate:  [31-84] 75 (05/08 0427) Resp:  [15-20] 18 (05/08 0427) BP: (126-173)/(47-117) 143/67 mmHg (05/08 0427) SpO2:  [78 %-100 %] 99 % (05/08 0427) Weight change:    Intake/Output from previous day: 05/07 0701 - 05/08 0700 In: 3855.8 [P.O.:960; I.V.:2895.8] Out: 700 [Urine:700]   General appearance: alert, cooperative and no distress Resp: clear to auscultation bilaterally Cardio: regular rate and rhythm GI: bowel sounds normal with no significant tenderness  Lab Results:  Recent Labs  11/03/13 0328  WBC 4.7  HGB 9.6*  HCT 29.4*  PLT 164   BMET  Recent Labs  11/04/13 0335 11/05/13 0415  NA 145 142  K 4.7 4.1  CL 111 109  CO2 24 21  GLUCOSE 90 105*  BUN 35* 29*  CREATININE 2.11* 1.73*  CALCIUM 8.8 8.9   CMET CMP     Component Value Date/Time   NA 142 11/05/2013 0415   K 4.1 11/05/2013 0415   CL 109 11/05/2013 0415   CO2 21 11/05/2013 0415   GLUCOSE 105* 11/05/2013 0415   BUN 29* 11/05/2013 0415   CREATININE 1.73* 11/05/2013 0415   CALCIUM 8.9 11/05/2013 0415   PROT 5.7* 11/05/2013 0415   ALBUMIN 2.1* 11/05/2013 0415   AST 166* 11/05/2013 0415   ALT 85* 11/05/2013 0415   ALKPHOS 1166* 11/05/2013 0415   BILITOT 8.2* 11/05/2013 0415   GFRNONAA 34* 11/05/2013 0415   GFRAA 39* 11/05/2013 0415    CBG (last 3)  No results found for this basename: GLUCAP,  in the last 72 hours  INR RESULTS:   Lab Results  Component Value Date   INR 1.06 01/15/2013   INR 0.96 08/14/2011   INR 1.0 08/18/2007     Studies/Results: [place] Abdomen Complete  11/03/2013   CLINICAL DATA:  Elevated LFTs, pancreatitis, question choledocholithiasis; abnormal CT with intrahepatic biliary dilatation; past history hypertension, coronary artery disease, GERD, CHF, peptic ulcer disease, kidney stones  EXAM: ULTRASOUND  ABDOMEN COMPLETE  COMPARISON:  CT abdomen 11/02/2013  FINDINGS: Gallbladder:  Surgically absent  Common bile duct:  Diameter: 7 mm diameter, which can be normal post cholecystectomy. Mid to distal CBD obscured by bowel gas, unable to visualize to the pancreas.  Liver:  Mildly inhomogeneous echogenicity. Intrahepatic biliary dilatation. No gross evidence of hepatic mass though intrahepatic assessment is suboptimal due to bowel gas.  IVC:  Unable to visualize due to bowel gas.  Pancreas:  Obscured by bowel gas.  Spleen:  Normal appearance, 4.9 cm length  Right Kidney:  Length: 11.2 cm. Cortical thinning. Borderline increased cortical echogenicity. Exophytic lesion at inferior pole, complex hypoechoic, 1.5 x 2.2 x 1.4 cm. Additional cyst with simple features lateral RIGHT kidney 3.1 x 3.5 x 3.2 cm. No hydronephrosis or shadowing calcifications.  Left Kidney:  Length: 11.4 cm. Cortical thinning. Upper normal cortical echogenicity. No gross mass or hydronephrosis. No definite shadowing calcification.  Abdominal aorta:  Obscured by bowel gas  Other findings:  No free-fluid  IMPRESSION: Post cholecystectomy.  Limited exam secondary to bowel gas.  Intrahepatic biliary dilatation of uncertain etiology, unable to assess distal CBD and pancreatic head due to bowel gas.  Simple cyst mid RIGHT kidney with an additional complex hypoechoic lesion 1.5 x 2.2 x 1.4 cm at inferior pole ; this lesion is complex by  CT but unable to assess by MR due to pacemaker.  Renal cortical atrophy bilaterally.   Electronically Signed   By: Ulyses SouthwardMark  Boles M.D.   On: 11/03/2013 21:38    Medications: I have reviewed the patient's current medications.  Assessment/Plan: #1 Pancreatitis: clinically stable, continue bowel rest, recheck pancreatic enzymes tomorrow #2 Protein calorie malnutrition: severe and we will place panda and start enteral feeds today #3 Acute kidney injury on chronic kidney disease, stage 3: stable with IVF   LOS: 4 days    Jarome MatinDaniel Marshel Golubski 11/05/2013, 7:47 AM

## 2013-11-05 NOTE — Progress Notes (Signed)
Radiology showed Panda tube to not be in correct place for feeding. Called on call GI physician (Outlaw) who stated to leave tube in place and hold all feedings through tube. Decision will be made tomorrow to determine whether the tube can be repositioned or if it needs to be pulled and placed again. MD will place orders in AM. Will continue to monitor patient for discomfort.

## 2013-11-06 ENCOUNTER — Inpatient Hospital Stay (HOSPITAL_COMMUNITY): Payer: Medicare Other

## 2013-11-06 LAB — COMPREHENSIVE METABOLIC PANEL
ALK PHOS: 1325 U/L — AB (ref 39–117)
ALT: 77 U/L — ABNORMAL HIGH (ref 0–53)
AST: 167 U/L — ABNORMAL HIGH (ref 0–37)
Albumin: 1.9 g/dL — ABNORMAL LOW (ref 3.5–5.2)
BILIRUBIN TOTAL: 8.7 mg/dL — AB (ref 0.3–1.2)
BUN: 26 mg/dL — AB (ref 6–23)
CHLORIDE: 115 meq/L — AB (ref 96–112)
CO2: 22 meq/L (ref 19–32)
Calcium: 8.8 mg/dL (ref 8.4–10.5)
Creatinine, Ser: 1.6 mg/dL — ABNORMAL HIGH (ref 0.50–1.35)
GFR calc Af Amer: 43 mL/min — ABNORMAL LOW (ref >90)
GFR, EST NON AFRICAN AMERICAN: 37 mL/min — AB (ref >90)
Glucose, Bld: 80 mg/dL (ref 70–99)
POTASSIUM: 4.2 meq/L (ref 3.7–5.3)
Sodium: 146 mEq/L (ref 137–147)
Total Protein: 5.3 g/dL — ABNORMAL LOW (ref 6.0–8.3)

## 2013-11-06 LAB — LIPASE, BLOOD: LIPASE: 1159 U/L — AB (ref 11–59)

## 2013-11-06 LAB — AMYLASE: AMYLASE: 320 U/L — AB (ref 0–105)

## 2013-11-06 MED ORDER — IOHEXOL 300 MG/ML  SOLN
30.0000 mL | Freq: Once | INTRAMUSCULAR | Status: AC | PRN
  Administered 2013-11-06: 30 mL via ORAL

## 2013-11-06 NOTE — Progress Notes (Signed)
RN attempting to notify G.I. MD on call.  RN awaiting a call back from MD.

## 2013-11-06 NOTE — Progress Notes (Signed)
Pt has had uneventful night. Pt needed pain medicine x 2 for mild abdominal pain. Pt did not complain of any discomfort from the Panda tube. Will continue to monitor.

## 2013-11-06 NOTE — Progress Notes (Signed)
Subjective: I was called in the evening on [name] regarding his tube, which was placed yesterday, was coiled.  I requested that the MD placing the tube manage this as to positioning and could probably use radiology's help in noting placement.  Has not been moved yet, but Outlaw with GI made aware.  He had a bit of pain last night, mostly complains of a dry mouth this AM.  Objective: Vital signs in last 24 hours: Temp:  [97.6 F (36.4 C)-98 F (36.7 C)] 97.8 F (36.6 C) (05/09 0605) Pulse Rate:  [73-87] 73 (05/09 0605) Resp:  [14-18] 18 (05/09 0605) BP: (118-139)/(56-95) 118/95 mmHg (05/09 0605) SpO2:  [98 %] 98 % (05/09 0605) Weight change:  Last BM Date: 11/03/13  Intake/Output from previous day: 05/08 0701 - 05/09 0700 In: 3125 [I.V.:3125] Out: 900 [Urine:900] Intake/Output this shift:    General appearance: alert, cooperative and appears stated age Throat: lips, mucosa, and tongue normal; teeth and gums normal Resp: clear to auscultation bilaterally Cardio: regular rate and rhythm, S1, S2 normal, no murmur, click, rub or gallop GI: soft, non-tender; bowel sounds normal; no masses,  no organomegaly Extremities: extremities normal, atraumatic, no cyanosis or edema Skin: Skin color, texture, turgor normal. No rashes or lesions  Lab Results: No results found for this basename: WBC, HGB, HCT, PLT,  in the last 72 hours BMET  Recent Labs  11/05/13 0415 11/06/13 0507  NA 142 146  K 4.1 4.2  CL 109 115*  CO2 21 22  GLUCOSE 105* 80  BUN 29* 26*  CREATININE 1.73* 1.60*  CALCIUM 8.9 8.8    Studies/Results: Dg Abd 1 View  11/05/2013   CLINICAL DATA:  Advancement of hand tube  EXAM: ABDOMEN - 1 VIEW  COMPARISON:  DG INTRO LONG GI TUBE dated 11/05/2013  FINDINGS: Feeding tube is identified to the left of midline in the upper abdomen it is loops multiple times on itself in the midline and left of midline.  IMPRESSION: Feeding tube looped upon itself extensively, with tip in  the stomach. Report called to [id number] at 2003.   Electronically Signed   By: Esperanza Heiraymond  Rubner M.D.   On: 11/05/2013 20:02   Dg Intro Long Gi Tube  11/05/2013   CLINICAL DATA:  Feeding tube placement.  EXAM: INTRO LONG GI TUBE  FLUOROSCOPY TIME:  6 min, 23 seconds.  COMPARISON:  CT abdomen and pelvis 11/02/2013.  FINDINGS: Feeding tube was placed. The tip could not be advanced into the duodenum. The tube was left coiled in the stomach.  IMPRESSION: Feeding tube is left coiled in the stomach and could not be advanced into the duodenum. Recommend Plain film of the abdomen in 6-8 hr to assess for advancement.   Electronically Signed   By: Drusilla Kannerhomas  Dalessio M.D.   On: 11/05/2013 13:28    Medications:  I have reviewed the patient's current medications. Scheduled: . feeding supplement (PRO-STAT SUGAR FREE 64)  30 mL Per Tube BID WC  . pantoprazole (PROTONIX) IV  40 mg Intravenous QHS   Continuous: . sodium chloride 125 mL/hr at 11/04/13 0138  . sodium chloride 125 mL/hr at 11/06/13 0619  . GLUCERNA 1.0 CAL/FIBER Stopped (11/05/13 1900)   WUJ:[id number]:HYDROmorphone (DILAUDID) injection, ondansetron (ZOFRAN) IV  Assessment/Plan: #1 Pancreatitis: clinically stable. GI will be managing his tube feeds and address Panda placement today. #2 Protein calorie malnutrition: As noted above. #3 Acute kidney injury on chronic kidney disease, stage 3: stable with IVF. Cr down to 1.6  this AM  Pain control remains acceptable, will need continued monitoring while refeeding as above.   LOS: 5 days   Adelfa KohRichard W Zendayah [name] 11/06/2013, 9:08 AM

## 2013-11-06 NOTE — Progress Notes (Signed)
MD on call asked RN to notify G.I. On call.

## 2013-11-06 NOTE — Progress Notes (Signed)
Panda Tube continues to clog off. Notified MD answering service on call @ [phone]. RN awaiting MD to call back.

## 2013-11-06 NOTE — Progress Notes (Signed)
Subjective: Attempted nasojejunal feeding tube placed yesterday. Slight interval decrease in epigastric discomfort.  Objective: Vital signs in last 24 hours: Temp:  [97.6 F (36.4 C)-98 F (36.7 C)] 97.8 F (36.6 C) (05/09 0605) Pulse Rate:  [73-87] 73 (05/09 0605) Resp:  [14-18] 18 (05/09 0605) BP: (118-139)/(56-95) 118/95 mmHg (05/09 0605) SpO2:  [98 %] 98 % (05/09 0605) Weight change:  Last BM Date: 11/03/13  PE: GEN:  NAD, jaundiced EXT:  Edema of hands, minimal edema lower extremities, scattered ecchymoses ABD:  Mild epigastric tenderness without peritonitis, hypoactive bowel sounds  Lab Results: CBC    Component Value Date/Time   WBC 4.7 11/03/2013 0328   RBC 2.88* 11/03/2013 0328   HGB 9.6* 11/03/2013 0328   HCT 29.4* 11/03/2013 0328   PLT 164 11/03/2013 0328   MCV 102.1* 11/03/2013 0328   MCH 33.3 11/03/2013 0328   MCHC 32.7 11/03/2013 0328   RDW 16.0* 11/03/2013 0328   LYMPHSABS 0.6* 11/01/2013 2245   MONOABS 0.7 11/01/2013 2245   EOSABS 0.0 11/01/2013 2245   BASOSABS 0.0 11/01/2013 2245   CMP     Component Value Date/Time   NA 146 11/06/2013 0507   K 4.2 11/06/2013 0507   CL 115* 11/06/2013 0507   CO2 22 11/06/2013 0507   GLUCOSE 80 11/06/2013 0507   BUN 26* 11/06/2013 0507   CREATININE 1.60* 11/06/2013 0507   CALCIUM 8.8 11/06/2013 0507   PROT 5.3* 11/06/2013 0507   ALBUMIN 1.9* 11/06/2013 0507   AST 167* 11/06/2013 0507   ALT 77* 11/06/2013 0507   ALKPHOS 1325* 11/06/2013 0507   BILITOT 8.7* 11/06/2013 0507   GFRNONAA 37* 11/06/2013 0507   GFRAA 43* 11/06/2013 0507   Lipase > 1000  Assessment:  1. Pancreatitis. Ongoing. I suspect patient has passed bile duct stone, and has secondary biliary stricturing.  2. Elevated LFTs, obstructive pattern. Suspect from pancreatitis. No obvious bile duct stone or mass was seen on yesterdays endoscopic ultrasound. And while pancreatic cancer is unusual as a cause of pancreatitis, it is not unheard of. The ability of EUS to detect a pancreatic mass in setting of  profound inflammatory changes in the pancreas (such as from pancreatitis) is rather limited.  3. Malnutrition, feeding difficulties. 4.   Edema, suspect from hypoalbuminemia.  Plan:  1.  Will need reattempt at post-pyloric (preferentially, post Ligament of Treitz) placement of nasojejunal feeding tube. I have discussed with Dr. Excell Seltzerooper in Radiology. 2.  If radiology unable to place tube again post-pyloric, then patient will need PICC line for TPN placement. 3.  Conservative therapy for pancreatitis. 4.   My hope is that by nutritively bypassing the pancreas (via nasojejunal tube feeds), the patient's pancreatitis and pancreatic edema will improve and hopefully patient's liver tests will improve.  5. Would continue to watch patient's liver tests and clinical course. If the patient's liver tests continue to significantly rise, will need to consider ERCP early next week. However, I am fairly reluctant to consider this at the present time, given patient's ongoing clinical, radiographic and laboratory studies consistent with ongoing pancreatitis.  6. Regardless of patient's clinical course, one might consider MRI/MRCP sometime in the near future, hopefully once some of the patient's pancreatitis changes improve, to better delineate and exclude the possibility of latent pancreatic or bile duct malignancy. 7.  Will follow.   Willis ModenaWilliam Kaylana Fenstermacher 11/06/2013, 12:14 PM

## 2013-11-06 NOTE — Progress Notes (Signed)
Panda Tube was continually ringing out clog in line while trying to tube feed patient. Changed tubing and system twice. Panda tube still not working properly. Will let MD know and pass along to night shift RN. Thanks.

## 2013-11-06 NOTE — Progress Notes (Signed)
Dear Doctor: [name] HarmanPaterson This patient has been identified as a candidate for PICC for the following reason (s): poor veins/poor circulatory system (CHF, COPD, emphysema, diabetes, steroid use, IV drug abuse, etc.) If you agree, please write an order for the indicated device. For any questions contact the Vascular Access Team at [phone] if no answer, please leave a message.  Thank you for supporting the early vascular access assessment program.

## 2013-11-06 NOTE — Progress Notes (Signed)
MD on call for G.I. stated that the [facility] must have a  "kink' in it.  RN stated that she would continue to work on the "kink".

## 2013-11-07 ENCOUNTER — Inpatient Hospital Stay (HOSPITAL_COMMUNITY): Payer: Medicare Other

## 2013-11-07 LAB — COMPREHENSIVE METABOLIC PANEL
ALBUMIN: 1.9 g/dL — AB (ref 3.5–5.2)
ALT: 74 U/L — ABNORMAL HIGH (ref 0–53)
AST: 169 U/L — AB (ref 0–37)
Alkaline Phosphatase: 1430 U/L — ABNORMAL HIGH (ref 39–117)
BILIRUBIN TOTAL: 10.1 mg/dL — AB (ref 0.3–1.2)
BUN: 27 mg/dL — AB (ref 6–23)
CALCIUM: 8.9 mg/dL (ref 8.4–10.5)
CHLORIDE: 115 meq/L — AB (ref 96–112)
CO2: 22 mEq/L (ref 19–32)
CREATININE: 1.58 mg/dL — AB (ref 0.50–1.35)
GFR calc Af Amer: 44 mL/min — ABNORMAL LOW (ref >90)
GFR, EST NON AFRICAN AMERICAN: 38 mL/min — AB (ref >90)
Glucose, Bld: 91 mg/dL (ref 70–99)
Potassium: 4.2 mEq/L (ref 3.7–5.3)
Sodium: 147 mEq/L (ref 137–147)
Total Protein: 5.3 g/dL — ABNORMAL LOW (ref 6.0–8.3)

## 2013-11-07 LAB — LIPASE, BLOOD: LIPASE: 778 U/L — AB (ref 11–59)

## 2013-11-07 NOTE — Progress Notes (Signed)
3 RNs worked on the [employer] tube/Kangaroo pump issues.  Patient is getting tube feeding.

## 2013-11-07 NOTE — Progress Notes (Signed)
Paged GI MD regarding panda tube not working appropriately. We have changed tubing and flushed tubing multiple time this morning and it will work for a few minutes and then begin alarming again. Pt and family frustrated with alarm as patient was unable to sleep last night. Will wait for new orders.   KUB ordered.

## 2013-11-07 NOTE — Progress Notes (Signed)
Daughter Darnelle BosLisa Frederick @ [phone]. RN notified daughter of the possible PICC placement and administration of TPN tomorrow.  Daughter was worried about patient and RN reassured her that the medical team was doing their best to give the patient the best care possible.

## 2013-11-07 NOTE — Progress Notes (Signed)
Subjective: Daughter and son in law at bedside today.  Complains mostly of the issues with inconsistent feedings due to issues with his Panda tube.  GI to continue to address these.  No abd pain overnight.  Objective: Vital signs in last 24 hours: Temp:  [97.5 F (36.4 C)-98.4 F (36.9 C)] 98.4 F (36.9 C) (05/10 0544) Pulse Rate:  [76-80] 80 (05/10 0544) Resp:  [16-18] 18 (05/10 0544) BP: (141-151)/(56-67) 151/67 mmHg (05/10 0544) SpO2:  [95 %-99 %] 95 % (05/10 0544) Weight change:  Last BM Date: 11/03/13  Intake/Output from previous day: 05/09 0701 - 05/10 0700 In: 3053.8 [I.V.:2993.8; NG/GT:60] Out: 950 [Urine:950] Intake/Output this shift:   General appearance: alert, cooperative and appears stated age  Throat: lips, mucosa, and tongue somewhat dry; teeth and gums normal  Resp: clear to auscultation bilaterally  Cardio: regular rate and rhythm, S1, S2 normal, no murmur, click, rub or gallop  GI: soft, non-tender; bowel sounds normal; no masses, no organomegaly  Extremities: extremities normal, atraumatic, no cyanosis or edema  Skin: Skin color, texture, turgor normal. No rashes or lesions  Lab Results: No results found for this basename: WBC, HGB, HCT, PLT,  in the last 72 hours BMET  Recent Labs  11/06/13 0507 11/07/13 0500  NA 146 147  K 4.2 4.2  CL 115* 115*  CO2 22 22  GLUCOSE 80 91  BUN 26* 27*  CREATININE 1.60* 1.58*  CALCIUM 8.8 8.9    Studies/Results: Dg Abd 1 View  11/05/2013   CLINICAL DATA:  Advancement of hand tube  EXAM: ABDOMEN - 1 VIEW  COMPARISON:  DG INTRO LONG GI TUBE dated 11/05/2013  FINDINGS: Feeding tube is identified to the left of midline in the upper abdomen it is loops multiple times on itself in the midline and left of midline.  IMPRESSION: Feeding tube looped upon itself extensively, with tip in the stomach. Report called to [id number] at 2003.   Electronically Signed   By: Esperanza Heiraymond  Rubner M.D.   On: 11/05/2013 20:02   Dg Intro Long Gi  Tube  11/06/2013   CLINICAL DATA:  Acute pancreatitis. An attempt was made yesterday to place a transpyloric feeding tube, but this was unsuccessful. We have been asked to try once again today.  EXAM: REPOSITIONING OF THE INDWELLING FEEDING TUBE UNDER FLUOROSCOPY  TECHNIQUE: Initially, the ribs no indwelling feeding tube had numerous loops in the stomach, and its tip was in the gastric fundus. Under direct fluoroscopic visualization and with the aid of an Amplatz stiff guide wire and an angled stiff glide wire, I was able to reposition the feeding tube such that its tip is in the duodenum bulb. Injection of contrast demonstrates a tight stricture in the proximal 2nd portion of the duodenum just beyond the bulb, and I was unable to advance the tube beyond this stricture. After the fluoroscopy time documented below, I elected to discontinue the procedure and leave the tip of the feeding tube in the duodenal bulb. The sideholes of the feeding tube are in the gastric pylorus.  CONTRAST:  30mL OMNIPAQUE IOHEXOL 300 MG/ML via the indwelling tube.  FLUOROSCOPY TIME:  13 min, 23 sec  COMPARISON:  DG ABDOMEN 1V dated 11/05/2013; DG INTRO LONG GI TUBE dated 11/05/2013; US ABDOMEN COMPLETE dated 11/03/2013; CT ABDOMEN W/O CM dated 11/02/2013  FINDINGS: Tight stricture in the proximal 2nd portion of the duodenum just beyond the duodenal bulb. This correlates with the findings on the CT 4 days ago. I was  unable to advance the feeding tube through this stricture, so its tip is in the duodenal bulb with side holes in the gastric pylorus.  IMPRESSION: Feeding tube repositioning with its tip in the duodenal bulb, side hole is in the gastric pylorus.  Please note there is a tight stricture in the proximal 2nd portion of the duodenum which did not allow passage of the feeding tube.  These results will be called to the ordering clinician or representative by the Radiologist Assistant, and communication documented in the PACS Dashboard.    Electronically Signed   By: Hulan Saashomas  Lawrence M.D.   On: 11/06/2013 14:37   Dg Intro Long Gi Tube  11/05/2013   CLINICAL DATA:  Feeding tube placement.  EXAM: INTRO LONG GI TUBE  FLUOROSCOPY TIME:  6 min, 23 seconds.  COMPARISON:  CT abdomen and pelvis 11/02/2013.  FINDINGS: Feeding tube was placed. The tip could not be advanced into the duodenum. The tube was left coiled in the stomach.  IMPRESSION: Feeding tube is left coiled in the stomach and could not be advanced into the duodenum. Recommend Plain film of the abdomen in 6-8 hr to assess for advancement.   Electronically Signed   By: Drusilla Kannerhomas  Dalessio M.D.   On: 11/05/2013 13:28    Medications:  I have reviewed the patient's current medications. Scheduled: . feeding supplement (PRO-STAT SUGAR FREE 64)  30 mL Per Tube BID WC  . pantoprazole (PROTONIX) IV  40 mg Intravenous QHS   Continuous: . sodium chloride 125 mL/hr at 11/07/13 0441  . sodium chloride 125 mL/hr at 11/06/13 1844  . GLUCERNA 1.0 CAL/FIBER 1,000 mL (11/06/13 1524)   ZOX:[id number]:HYDROmorphone (DILAUDID) injection, ondansetron (ZOFRAN) IV  Assessment/Plan: #1 Pancreatitis: clinically stable. GI continues to manage his Panda tube, which as been problematic.  They will need to address this again today.  #2 Protein calorie malnutrition: As noted above.  #3 Acute kidney injury on chronic kidney disease, stage 3: stable with IVF. Cr down to 1.58 this AM  Pain control remains acceptable, will need continued monitoring while refeeding as above.  Will need to get OOB today and do some walking with assistance.   LOS: 6 days   Adelfa KohRichard W Lake Surgery And Endoscopy Center Ltdisovec 11/07/2013, 9:45 AM

## 2013-11-07 NOTE — Progress Notes (Signed)
Spoke with Dr. Dulce Sellarutlaw, regarding feed tube.  Feeding tube tubing has been replaced, pump changed and pump is still not running.  Instructed to call radiology and ask for recommendation.  If no recommendations  The tube will probably need to be pulled and a PICC line placed for TNA

## 2013-11-07 NOTE — Progress Notes (Signed)
Subjective: Reattempted placement of nasojejunal feeding tube unsuccessful.  Objective: Vital signs in last 24 hours: Temp:  [97.5 F (36.4 C)-98.4 F (36.9 C)] 98.4 F (36.9 C) (05/10 0544) Pulse Rate:  [76-80] 80 (05/10 0544) Resp:  [16-18] 18 (05/10 0544) BP: (141-151)/(56-67) 151/67 mmHg (05/10 0544) SpO2:  [95 %-99 %] 95 % (05/10 0544) Weight change:  Last BM Date: 11/03/13  PE: GEN:  NAD, jaundiced SKIN/HEENT:  Jaundiced, sclera icteric, scattered ecchymoses EXT:  Interval improvement (but not resolution) of upper extremity swelling ABD:  Soft, mild-to-moderate epigastric tenderness  Lab Results: CBC    Component Value Date/Time   WBC 4.7 11/03/2013 0328   RBC 2.88* 11/03/2013 0328   HGB 9.6* 11/03/2013 0328   HCT 29.4* 11/03/2013 0328   PLT 164 11/03/2013 0328   MCV 102.1* 11/03/2013 0328   MCH 33.3 11/03/2013 0328   MCHC 32.7 11/03/2013 0328   RDW 16.0* 11/03/2013 0328   LYMPHSABS 0.6* 11/01/2013 2245   MONOABS 0.7 11/01/2013 2245   EOSABS 0.0 11/01/2013 2245   BASOSABS 0.0 11/01/2013 2245   CMP     Component Value Date/Time   NA 147 11/07/2013 0500   K 4.2 11/07/2013 0500   CL 115* 11/07/2013 0500   CO2 22 11/07/2013 0500   GLUCOSE 91 11/07/2013 0500   BUN 27* 11/07/2013 0500   CREATININE 1.58* 11/07/2013 0500   CALCIUM 8.9 11/07/2013 0500   PROT 5.3* 11/07/2013 0500   ALBUMIN 1.9* 11/07/2013 0500   AST 169* 11/07/2013 0500   ALT 74* 11/07/2013 0500   ALKPHOS 1430* 11/07/2013 0500   BILITOT 10.1* 11/07/2013 0500   GFRNONAA 38* 11/07/2013 0500   GFRAA 44* 11/07/2013 0500   Assessment:  1.  Pancreatitis. 2.  Elevated LFTs, continue to increase. 3.  Protein calorie malnutrition.  Two failed attempts at nasojejunal feeding tube placement and tube feed administration. 4.  Anemia.  No overt GI bleeding.  Suspect renal- and acute disease process- and dilutional-related.  Plan:  1.  Recheck lipase today. 2.  PICC line for TPN.  Discussed with pharmacy, who will adjust dosing given  patient's elevated liver tests. 3.  If LFTs continue to increase, I don't see much other option other than ERCP for bile duct stent, despite reservations given patient's pancreatitis.  Patient's abdominal pain symptoms, however, don't seem as severe as one would expect with such markedly elevated lipase levels.  Optimal timing of ERCP is unclear, but we certainly can't keep watching his LFTs continue to rise indefinitely.  Perhaps today's lipase level will help guide [name] [name] as to optimal timing for ERCP.   [name] [name] 11/07/2013, 12:03 PM

## 2013-11-08 ENCOUNTER — Inpatient Hospital Stay (HOSPITAL_COMMUNITY): Payer: Medicare Other

## 2013-11-08 LAB — COMPREHENSIVE METABOLIC PANEL
ALT: 73 U/L — ABNORMAL HIGH (ref 0–53)
AST: 170 U/L — AB (ref 0–37)
Albumin: 1.9 g/dL — ABNORMAL LOW (ref 3.5–5.2)
Alkaline Phosphatase: 1537 U/L — ABNORMAL HIGH (ref 39–117)
BUN: 29 mg/dL — ABNORMAL HIGH (ref 6–23)
CALCIUM: 8.9 mg/dL (ref 8.4–10.5)
CHLORIDE: 116 meq/L — AB (ref 96–112)
CO2: 22 mEq/L (ref 19–32)
Creatinine, Ser: 1.48 mg/dL — ABNORMAL HIGH (ref 0.50–1.35)
GFR calc Af Amer: 47 mL/min — ABNORMAL LOW (ref >90)
GFR calc non Af Amer: 41 mL/min — ABNORMAL LOW (ref >90)
Glucose, Bld: 93 mg/dL (ref 70–99)
Potassium: 4.1 mEq/L (ref 3.7–5.3)
SODIUM: 149 meq/L — AB (ref 137–147)
Total Bilirubin: 11.4 mg/dL — ABNORMAL HIGH (ref 0.3–1.2)
Total Protein: 5.4 g/dL — ABNORMAL LOW (ref 6.0–8.3)

## 2013-11-08 LAB — PHOSPHORUS: Phosphorus: 2.1 mg/dL — ABNORMAL LOW (ref 2.3–4.6)

## 2013-11-08 LAB — MAGNESIUM: Magnesium: 1.8 mg/dL (ref 1.5–2.5)

## 2013-11-08 LAB — GLUCOSE, CAPILLARY: GLUCOSE-CAPILLARY: 112 mg/dL — AB (ref 70–99)

## 2013-11-08 MED ORDER — FAT EMULSION 20 % IV EMUL
250.0000 mL | INTRAVENOUS | Status: AC
  Administered 2013-11-08: 250 mL via INTRAVENOUS
  Filled 2013-11-08: qty 250

## 2013-11-08 MED ORDER — SODIUM CHLORIDE 0.9 % IJ SOLN
10.0000 mL | INTRAMUSCULAR | Status: DC | PRN
  Administered 2013-11-09 – 2013-11-15 (×8): 10 mL

## 2013-11-08 MED ORDER — TRACE MINERALS CR-CU-F-FE-I-MN-MO-SE-ZN IV SOLN
INTRAVENOUS | Status: AC
  Administered 2013-11-08: 17:00:00 via INTRAVENOUS
  Filled 2013-11-08: qty 1000

## 2013-11-08 MED ORDER — SODIUM CHLORIDE 0.9 % IV SOLN
INTRAVENOUS | Status: AC
  Administered 2013-11-09: via INTRAVENOUS

## 2013-11-08 MED ORDER — INSULIN ASPART 100 UNIT/ML ~~LOC~~ SOLN
0.0000 [IU] | SUBCUTANEOUS | Status: DC
  Administered 2013-11-09 – 2013-11-10 (×8): 1 [IU] via SUBCUTANEOUS
  Administered 2013-11-10: 2 [IU] via SUBCUTANEOUS
  Administered 2013-11-11 (×2): 1 [IU] via SUBCUTANEOUS
  Administered 2013-11-11: 3 [IU] via SUBCUTANEOUS
  Administered 2013-11-11: 1 [IU] via SUBCUTANEOUS
  Administered 2013-11-11 (×2): 2 [IU] via SUBCUTANEOUS
  Administered 2013-11-12 (×4): 1 [IU] via SUBCUTANEOUS
  Administered 2013-11-13: 2 [IU] via SUBCUTANEOUS
  Administered 2013-11-13 – 2013-11-14 (×6): 1 [IU] via SUBCUTANEOUS

## 2013-11-08 MED FILL — Nutritional Supplement Liquid: ORAL | Qty: 1000 | Status: AC

## 2013-11-08 NOTE — Evaluation (Signed)
Physical Therapy Evaluation Patient Details Name: [name] [name] MRN: [id number] DOB: 30-Apr-1924 Today's Date: 11/08/2013   History of Present Illness  Pt is an 80 year old male admitted 5/4 for biliary obstruction related to pancreatitis and PHMx of pacemaker, CAD, HTN, gout, CHF, OA.    Clinical Impression  Pt currently with functional limitations due to the deficits listed below (see PT Problem List).  Pt will benefit from skilled PT to increase their independence and safety with mobility to allow discharge to the venue listed below.  Pt reports feeling weaker then baseline.  Pt able to ambulate good distance in hallway however slightly unsteady during turns.       Follow Up Recommendations Home health PT    Equipment Recommendations  None recommended by PT    Recommendations for Other Services       Precautions / Restrictions Precautions Precautions: Fall      Mobility  Bed Mobility Overal bed mobility: Needs Assistance Bed Mobility: Sit to Supine       Sit to supine: Supervision   General bed mobility comments: sitting EOB upon arrival  Transfers Overall transfer level: Needs assistance Equipment used: None Transfers: Sit to/from Stand Sit to Stand: Min guard         General transfer comment: min/guard for safety  Ambulation/Gait Ambulation/Gait assistance: Min guard Ambulation Distance (Feet): 400 Feet Assistive device: None Gait Pattern/deviations: Step-through pattern;Decreased stride length     General Gait Details: pt reports LE weakness, unsteady with turning requiring min/guard for safety  Stairs            Wheelchair Mobility    Modified Rankin (Stroke Patients Only)       Balance                                             Pertinent Vitals/Pain Pt without complaints    Home Living Family/patient expects to be discharged to:: Private residence Living Arrangements: Alone (lives above daughters  garage) Available Help at Discharge: Family;Available PRN/intermittently Type of Home: House Home Access: Other (comment) (stair chair lift)     Home Layout: One level Home Equipment: Walker - 2 wheels;Cane - single point;Tub bench      Prior Function Level of Independence: Independent               Hand Dominance        Extremity/Trunk Assessment               Lower Extremity Assessment: Generalized weakness         Communication   Communication: No difficulties  Cognition Arousal/Alertness: Awake/alert Behavior During Therapy: WFL for tasks assessed/performed Overall Cognitive Status: Within Functional Limits for tasks assessed                      General Comments      Exercises        Assessment/Plan    PT Assessment Patient needs continued PT services  PT Diagnosis Generalized weakness   PT Problem List Decreased strength;Decreased activity tolerance;Decreased mobility;Decreased balance  PT Treatment Interventions DME instruction;Gait training;Functional mobility training;Therapeutic activities;Therapeutic exercise;Patient/family education;Neuromuscular re-education;Balance training   PT Goals (Current goals can be found in the Care Plan section) Acute Rehab PT Goals PT Goal Formulation: With patient Time For Goal Achievement: 11/15/13 Potential to Achieve Goals: Good  Frequency Min 3X/week   Barriers to discharge        Co-evaluation               End of Session   Activity Tolerance: Patient tolerated treatment well Patient left: in bed;with call bell/phone within reach;with bed alarm set           Time: 1112-1126 PT Time Calculation (min): 14 min   Charges:   PT Evaluation $Initial PT Evaluation Tier I: 1 Procedure PT Treatments $Gait Training: 8-22 mins   PT G CodesLynnell Catalan:          [name] 11/08/2013, 11:45 AM [name], PT, DPT 11/08/2013 Pager: [phone]

## 2013-11-08 NOTE — Progress Notes (Signed)
Peripherally Inserted Central Catheter/Midline Placement  The IV Nurse has discussed with the patient and/or persons authorized to consent for the patient, the purpose of this procedure and the potential benefits and risks involved with this procedure.  The benefits include less needle sticks, lab draws from the catheter and patient may be discharged home with the catheter.  Risks include, but not limited to, infection, bleeding, blood clot (thrombus formation), and puncture of an artery; nerve damage and irregular heat beat.  Alternatives to this procedure were also discussed.  PICC/Midline Placement Documentation  PICC / Midline Double Lumen 11/08/13 PICC Right Basilic 38 cm 0 cm (Active)  Indication for Insertion or Continuance of Line Administration of hyperosmolar/irritating solutions (i.e. TPN, Vancomycin, etc.) 11/08/2013  1:26 PM  Exposed Catheter (cm) 0 cm 11/08/2013  1:26 PM  Site Assessment Clean;Dry;Intact 11/08/2013  1:26 PM  Dressing Change Due 11/15/13 11/08/2013  1:26 PM       Deatra RobinsonJessica Ann Poff 11/08/2013, 1:28 PM

## 2013-11-08 NOTE — Progress Notes (Signed)
NUTRITION FOLLOW UP  Intervention:   -TPN per pharmacy -Recommend monitor refeeding labs- K/Mg/Phos  -Diet advancement vs use of enteral nutrition trickle feeds per MD -Will continue to monitor  Nutrition Dx:   Inadequate oral intake related to inability to eat as evidenced by NPO-ongoing, to improve with initiation of TPN    Goal:   Advance diet as tolerated to low fat diet -unmet   Monitor:   TF tolerance, total protein/energy intake, diet advancement, weight, labs  Assessment:   5/05: Pt with hx of recent pancreatitis, admitted with abdominal pain that started yesterday. Pt discussed during multidisciplinary rounds.  -Met with pt who c/o lack of hunger for the past 3 weeks  -Spent the past 7 days consuming 20-30oz of Gatorade, did not eat anything  -Thinks he's lost 12 pounds in the past 3 weeks  -Before previous admission he was eating whatever he wanted to, 3 meals/day, denies any specific high fat foods he regularly consumed  -C/o diarrhea that started yesterday afternoon and denied any vomiting but did c/o nausea in the past week  -Noted MD discussed with pt possible post-pyloric TF, awaiting GI evaluation  BUN/Cr elevated with low GFR  Alk phos, lipase, AST/ALT, and total bilirubin elevated   5/08: -Pt to have panda tube placed on 5/08 and to initiate nasojejunal tube feds -Glucerna 1.0 at 50 ml/hr has been ordered. This regimen will provide 1200 kcal (69% est kcal needs), and 50 gram protein (48% est protein needs) -Would benefit from addition of Pro-Stat 30 ml BID to meet at least 75% estimated nutrition needs -No orders have been placed outlining TF advancement. -Consider modifying to elemental formula (Vital 1.2) if pt exhibits intolerance, diarrhea or steatorrhea -Continue to recommend slow advancement for pt's risk of refeeding -Elevated LFT -K WNL -BUN/Crt elevated d/t AKI on CKD, improving with IVF. Receiving approx 3L fluid/day  5/11: -Pt with multiple  unsuccesful attempts of [place] tube replacement.  -Was unable to receive any enteral nutrition -Pt to have PICC lined placed today per discussion with RN -Per pharmacy note, plan to Start Clinimix E 5/15 at 67m/hr with fat emulsion at 142mhr -Goal rate of Clinimix E5/15 at 83 ml/hr with IVFE 20% at 10 ml/hr will provide 100g/day protein, 1894Kcal/day, ~2L. This will meet 100% est kcal needs and 95% est protein needs  -Discussed pt with pharmacist regarding pt's risk of refeeding. K/Mg WNL, phos slightly low at 2.1. Plan to continue to monitor; electrolytes added in TPN -Elevated LFT -Elevated Lipase -Crt elevated at 1.48 but improved, will continue to monitor with TPN advancement and adjust protein needs as warranted -Glucose WNL  Height: Ht Readings from Last 1 Encounters:  11/02/13 5' 8.5" (1.74 m)    Weight Status:   Wt Readings from Last 1 Encounters:  11/02/13 180 lb 11.2 oz (81.965 kg)    Re-estimated needs:  Kcal: 1750-1950  Protein: 105g  Fluid: 1.7-1.9L/day   Skin: WDL-jaundice  Diet Order: NPO   Intake/Output Summary (Last 24 hours) at 11/08/13 1332 Last data filed at 11/08/13 1100  Gross per 24 hour  Intake 2881.25 ml  Output   1250 ml  Net 1631.25 ml    Last BM: 5/06   Labs:   Recent Labs Lab 11/06/13 0507 11/07/13 0500 11/08/13 0418  NA 146 147 149*  K 4.2 4.2 4.1  CL 115* 115* 116*  CO2 _0 BUN 26* 27* 29*  CREATININE 1.60* 1.58* 1.48*  CALCIUM 8.8 8.9 8.9  MG  --   --  1.8  PHOS  --   --  2.1*  GLUCOSE 80 91 93    CBG (last 3)  No results found for this basename: GLUCAP,  in the last 72 hours  Scheduled Meds: . feeding supplement (PRO-STAT SUGAR FREE 64)  30 mL Per Tube BID WC  . insulin aspart  0-9 Units Subcutaneous Q4H  . pantoprazole (PROTONIX) IV  40 mg Intravenous QHS    Continuous Infusions: . sodium chloride 125 mL/hr at 11/06/13 1844  . sodium chloride    . Marland KitchenTPN (CLINIMIX-E) Adult     And  . fat emulsion    .  GLUCERNA 1.0 CAL/FIBER 1,000 mL (11/06/13 1524)    Orion LDN Clinical Dietitian [phone]

## 2013-11-08 NOTE — Progress Notes (Signed)
PARENTERAL NUTRITION CONSULT NOTE - INITIAL  Pharmacy Consult for TPN Indication: Biliary obstruction 2/2 pancreatitis, failed enteral feeding  No Known Allergies  Patient Measurements: Height: 5' 8.5" (174 cm) Weight: 180 lb 11.2 oz (81.965 kg) IBW/kg (Calculated) : 69.55 Adjusted Body Weight:  Usual Weight:   Vital Signs: Temp: 97.4 F (36.3 C) (05/11 0609) Temp src: Oral (05/11 0609) BP: 139/87 mmHg (05/11 0609) Pulse Rate: 75 (05/11 0609) Intake/Output from previous day: 05/10 0701 - 05/11 0700 In: 2881.3 [I.V.:2881.3] Out: 1450 [Urine:1450] Intake/Output from this shift:    Labs: No results found for this basename: WBC, HGB, HCT, PLT, APTT, INR,  in the last 72 hours   Recent Labs  11/06/13 0507 11/07/13 0500 11/08/13 0418  NA 146 147 149*  K 4.2 4.2 4.1  CL 115* 115* 116*  CO2 22 22 22   GLUCOSE 80 91 93  BUN 26* 27* 29*  CREATININE 1.60* 1.58* 1.48*  CALCIUM 8.8 8.9 8.9  MG  --   --  1.8  PHOS  --   --  2.1*  PROT 5.3* 5.3* 5.4*  ALBUMIN 1.9* 1.9* 1.9*  AST 167* 169* 170*  ALT 77* 74* 73*  ALKPHOS 1325* 1430* 1537*  BILITOT 8.7* 10.1* 11.4*   Estimated Creatinine Clearance: 34.6 ml/min (by C-G formula based on Cr of 1.48).   No results found for this basename: GLUCAP,  in the last 72 hours  Medical History: Past Medical History  Diagnosis Date  . Osteoarthritis   . Gout   . Hypertension   . History of coronary artery disease   . GERD (gastroesophageal reflux disease)   . CHF (congestive heart failure)   . Diastolic dysfunction   . History of GI bleed   . History of pacemaker     [employer] Adapta ADDro1  . Peptic ulcer disease   . BPH (benign prostatic hypertrophy)   . 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 Hyperlipidemia   . History of cerebrovascular disease   . Kidney stone   . Depression   . Chest pain    Insulin Requirements in the past 24 hours:  0 units insulin, no SSI or basal insulin orders, no hx  DM  Nutritional Goals:  RD recs: 1750-1950 KCal/day, protein 105g/day, fluid 1.7-1.9L/day   Clinimix E5/15 at a goal rate of 69m/hr + IVFE 20% at 146mhr to provide: 100g/day protein, 1894Kcal/day, ~2L fluid/day avg.  Current Nutrition: NPO  IVF: NS at 12559mr  Assessment: [age]F admitted 5/5 with abdominal pain, nausea, and vomiting with recent pancreatitis found to have acute worsening of pancreatitis with elevated LFTs and lipase possibly related to passed gallstones and biliary obstruction.  Per GI, pt will need ERCP with stent placement.  Multiple unsuccessful attempts at placing feeding tubes, now plan for PICC placement and initiation of TNA.  Risk for re-feeding syndrome as pt was not eating for 1 week PTA and has not been able to receive consistent nutrition inpatient.     Glucose - at goal  Electrolytes - Na 149 slightly elevated, Phos 2.1 - slightly low  LFTs - Elevated, stable overnight, alk phos continues rising  Renal: AoCKD - SCr much improved since admission, today 1.48, unclear baseline  TGs - 97 (4/14)  Prealbumin - not checked yet  Plan:  At 1800 today:  Start Clinimix E 5/15 at 83m73m with fat emulsion at 10ml38m Plan to advance as tolerated to the goal rate of 83 ml/hr.  TNA to contain standard multivitamins daily and trace elements MWF only due to ongoing shortage.  Reduce IVF to 55m/hr.  Add SSI sensitive q4h .   TNA lab panels on Mondays & Thursdays.  F/u plans for ERCP and clinical course  CRalene Bathe PharmD, BCPS 11/08/2013, 10:03 AM  Pager: [phone]

## 2013-11-08 NOTE — Plan of Care (Signed)
Problem: Problem: Diet/Nutrition Progression Goal: ADEQUATE NUTRITION Outcome: Completed/Met Date Met:  11/08/13 Patient will have TPN today     

## 2013-11-08 NOTE — Progress Notes (Signed)
Subjective: Seen earlier today, and he had mild epigastric pain, no dyspnea  Objective: Vital signs in last 24 hours: Temp:  [97.4 F (36.3 C)-97.6 F (36.4 C)] 97.6 F (36.4 C) (05/11 1449) Pulse Rate:  [69-75] 69 (05/11 1449) Resp:  [18] 18 (05/11 1449) BP: (128-142)/(58-87) 142/58 mmHg (05/11 1449) SpO2:  [97 %-98 %] 97 % (05/11 1449) Weight change:    Intake/Output from previous day: 05/10 0701 - 05/11 0700 In: 2881.3 [I.V.:2881.3] Out: 1450 [Urine:1450]   General appearance: alert, cooperative and no distress Resp: clear to auscultation bilaterally Cardio: regular rate and rhythm GI: mild epigastric tenderness  Lab Results: No results found for this basename: WBC, HGB, HCT, PLT,  in the last 72 hours BMET  Recent Labs  11/07/13 0500 11/08/13 0418  NA 147 149*  K 4.2 4.1  CL 115* 116*  CO2 22 22  GLUCOSE 91 93  BUN 27* 29*  CREATININE 1.58* 1.48*  CALCIUM 8.9 8.9   CMET CMP     Component Value Date/Time   NA 149* 11/08/2013 0418   K 4.1 11/08/2013 0418   CL 116* 11/08/2013 0418   CO2 22 11/08/2013 0418   GLUCOSE 93 11/08/2013 0418   BUN 29* 11/08/2013 0418   CREATININE 1.48* 11/08/2013 0418   CALCIUM 8.9 11/08/2013 0418   PROT 5.4* 11/08/2013 0418   ALBUMIN 1.9* 11/08/2013 0418   AST 170* 11/08/2013 0418   ALT 73* 11/08/2013 0418   ALKPHOS 1537* 11/08/2013 0418   BILITOT 11.4* 11/08/2013 0418   GFRNONAA 41* 11/08/2013 0418   GFRAA 47* 11/08/2013 0418    CBG (last 3)  No results found for this basename: GLUCAP,  in the last 72 hours  INR RESULTS:   Lab Results  Component Value Date   INR 1.06 01/15/2013   INR 0.96 08/14/2011   INR 1.0 08/18/2007     Studies/Results: Dg Abd 1 View  11/07/2013   CLINICAL DATA:  Feeding tube placement.  Acute pancreatitis.  EXAM: ABDOMEN - 1 VIEW  COMPARISON:  11/05/2013  FINDINGS: A feeding tube is seen with tip in the right upper quadrant overlying the pylorus or duodenal bulb. No evidence of dilated bowel loops. Some  residual contrast noted in the right colon.  IMPRESSION: Feeding tube tip overlies the pylorus or duodenal bulb.   Electronically Signed   By: Myles RosenthalJohn  Stahl M.D.   On: 11/07/2013 11:40   Dg Chest Port 1 View  11/08/2013   CLINICAL DATA:  Status post PICC line placement  EXAM: PORTABLE CHEST - 1 VIEW  COMPARISON:  DG CHEST 2 VIEW dated 03/26/2013  FINDINGS: The lungs are reasonably well inflated. The PICC line placed via the right upper extremity has its tip in the region of the proximal to mid SVC. The cardiopericardial silhouette is normal in size. The pulmonary vascularity is not engorged. There is no pneumothorax or pleural effusion. The permanent pacemaker is unchanged in appearance. There are chronic changes associated with the [facility] the right.  IMPRESSION: There is no evidence of a postprocedure complication following placement of the PICC line via the right upper extremity.   Electronically Signed   By: David  [place]   On: 11/08/2013 13:55    Medications: I have reviewed the patient's current medications.  Assessment/Plan: #1 Pancreatitis: clinically stable and we will recheck labs in the AM #2 Protein Calorie Malnutrition: slowly worsening, and TPN is to start soon  LOS: 7 days   [name] MatinDaniel Roy [name] 11/08/2013, 6:25 PM

## 2013-11-08 NOTE — Consult Note (Addendum)
[facility] Progress Note  Subjective: Mild abdominal pain no nausea or vomiting  Objective: Vital signs in last 24 hours: Temp:  [97.4 F (36.3 C)-97.5 F (36.4 C)] 97.4 F (36.3 C) (05/11 0609) Pulse Rate:  [72-75] 75 (05/11 0609) Resp:  [18] 18 (05/11 0609) BP: (128-139)/(57-87) 139/87 mmHg (05/11 0609) SpO2:  [98 %-99 %] 98 % (05/11 0609) Weight change:    PE: Abdomen soft slightly distended  Lab Results: Results for orders placed during the hospital encounter of 11/01/13 (from the past 24 hour(s))  COMPREHENSIVE METABOLIC PANEL     Status: Abnormal   Collection Time    11/08/13  4:18 AM      Result Value Ref Range   Sodium 149 (*) 137 - 147 mEq/L   Potassium 4.1  3.7 - 5.3 mEq/L   Chloride 116 (*) 96 - 112 mEq/L   CO2 22  19 - 32 mEq/L   Glucose, Bld 93  70 - 99 mg/dL   BUN 29 (*) 6 - 23 mg/dL   Creatinine, Ser 4.091.48 (*) 0.50 - 1.35 mg/dL   Calcium 8.9  8.4 - 81.110.5 mg/dL   Total Protein 5.4 (*) 6.0 - 8.3 g/dL   Albumin 1.9 (*) 3.5 - 5.2 g/dL   AST 914170 (*) 0 - 37 U/L   ALT 73 (*) 0 - 53 U/L   Alkaline Phosphatase 1537 (*) 39 - 117 U/L   Total Bilirubin 11.4 (*) 0.3 - 1.2 mg/dL   GFR calc non Af Amer 41 (*) >90 mL/min   GFR calc Af Amer 47 (*) >90 mL/min    Studies/Results: Dg Abd 1 View  11/07/2013   CLINICAL DATA:  Feeding tube placement.  Acute pancreatitis.  EXAM: ABDOMEN - 1 VIEW  COMPARISON:  11/05/2013  FINDINGS: A feeding tube is seen with tip in the right upper quadrant overlying the pylorus or duodenal bulb. No evidence of dilated bowel loops. Some residual contrast noted in the right colon.  IMPRESSION: Feeding tube tip overlies the pylorus or duodenal bulb.   Electronically Signed   By: Myles RosenthalJohn  Stahl M.D.   On: 11/07/2013 11:40   Dg Intro Long Gi Tube  11/06/2013   CLINICAL DATA:  Acute pancreatitis. An attempt was made yesterday to place a transpyloric feeding tube, but this was unsuccessful. We have been asked to try once again today.  EXAM:  REPOSITIONING OF THE INDWELLING FEEDING TUBE UNDER FLUOROSCOPY  TECHNIQUE: Initially, the ribs no indwelling feeding tube had numerous loops in the stomach, and its tip was in the gastric fundus. Under direct fluoroscopic visualization and with the aid of an Amplatz stiff guide wire and an angled stiff glide wire, I was able to reposition the feeding tube such that its tip is in the duodenum bulb. Injection of contrast demonstrates a tight stricture in the proximal 2nd portion of the duodenum just beyond the bulb, and I was unable to advance the tube beyond this stricture. After the fluoroscopy time documented below, I elected to discontinue the procedure and leave the tip of the feeding tube in the duodenal bulb. The sideholes of the feeding tube are in the gastric pylorus.  CONTRAST:  30mL OMNIPAQUE IOHEXOL 300 MG/ML via the indwelling tube.  FLUOROSCOPY TIME:  13 min, 23 sec  COMPARISON:  DG ABDOMEN 1V dated 11/05/2013; DG INTRO LONG GI TUBE dated 11/05/2013; US ABDOMEN COMPLETE dated 11/03/2013; CT ABDOMEN W/O CM dated 11/02/2013  FINDINGS: Tight stricture in the proximal 2nd portion of the duodenum  just beyond the duodenal bulb. This correlates with the findings on the CT 4 days ago. I was unable to advance the feeding tube through this stricture, so its tip is in the duodenal bulb with side holes in the gastric pylorus.  IMPRESSION: Feeding tube repositioning with its tip in the duodenal bulb, side hole is in the gastric pylorus.  Please note there is a tight stricture in the proximal 2nd portion of the duodenum which did not allow passage of the feeding tube.  These results will be called to the ordering clinician or representative by the Radiologist Assistant, and communication documented in the PACS Dashboard.   Electronically Signed   By: Hulan Saashomas  Lawrence M.D.   On: 11/06/2013 14:37      Assessment: 1. Pancreatitis, possibly related to passed gallstone, some fall in  lipase 2. Biliary obstruction related to  pancreatitis 3. Unsuccessful a nasoenteric feeding continuously rising liver function tests  Plan: 1. PICC line for TPN to be placed today 2. Needs ERCP with stent placement, will try to possibly arrange for tomorrow    Barrie FolkJohn C Bricelyn Freestone 11/08/2013, 7:55 AM

## 2013-11-09 LAB — GLUCOSE, CAPILLARY
Glucose-Capillary: 112 mg/dL — ABNORMAL HIGH (ref 70–99)
Glucose-Capillary: 123 mg/dL — ABNORMAL HIGH (ref 70–99)
Glucose-Capillary: 124 mg/dL — ABNORMAL HIGH (ref 70–99)
Glucose-Capillary: 131 mg/dL — ABNORMAL HIGH (ref 70–99)
Glucose-Capillary: 132 mg/dL — ABNORMAL HIGH (ref 70–99)
Glucose-Capillary: 143 mg/dL — ABNORMAL HIGH (ref 70–99)
Glucose-Capillary: 143 mg/dL — ABNORMAL HIGH (ref 70–99)

## 2013-11-09 LAB — COMPREHENSIVE METABOLIC PANEL
ALT: 66 U/L — ABNORMAL HIGH (ref 0–53)
AST: 158 U/L — AB (ref 0–37)
Albumin: 1.8 g/dL — ABNORMAL LOW (ref 3.5–5.2)
Alkaline Phosphatase: 1404 U/L — ABNORMAL HIGH (ref 39–117)
BUN: 30 mg/dL — ABNORMAL HIGH (ref 6–23)
CALCIUM: 8.7 mg/dL (ref 8.4–10.5)
CO2: 23 mEq/L (ref 19–32)
Chloride: 117 mEq/L — ABNORMAL HIGH (ref 96–112)
Creatinine, Ser: 1.33 mg/dL (ref 0.50–1.35)
GFR calc Af Amer: 54 mL/min — ABNORMAL LOW (ref >90)
GFR, EST NON AFRICAN AMERICAN: 46 mL/min — AB (ref >90)
Glucose, Bld: 127 mg/dL — ABNORMAL HIGH (ref 70–99)
Potassium: 3.8 mEq/L (ref 3.7–5.3)
SODIUM: 149 meq/L — AB (ref 137–147)
Total Bilirubin: 11.5 mg/dL — ABNORMAL HIGH (ref 0.3–1.2)
Total Protein: 5 g/dL — ABNORMAL LOW (ref 6.0–8.3)

## 2013-11-09 LAB — CBC
HEMATOCRIT: 25.7 % — AB (ref 39.0–52.0)
Hemoglobin: 8.9 g/dL — ABNORMAL LOW (ref 13.0–17.0)
MCH: 33.8 pg (ref 26.0–34.0)
MCHC: 34.6 g/dL (ref 30.0–36.0)
MCV: 97.7 fL (ref 78.0–100.0)
Platelets: 139 10*3/uL — ABNORMAL LOW (ref 150–400)
RBC: 2.63 MIL/uL — AB (ref 4.22–5.81)
RDW: 17.4 % — ABNORMAL HIGH (ref 11.5–15.5)
WBC: 4.7 10*3/uL (ref 4.0–10.5)

## 2013-11-09 LAB — PREALBUMIN: Prealbumin: 14.2 mg/dL — ABNORMAL LOW (ref 17.0–34.0)

## 2013-11-09 LAB — DIFFERENTIAL
Basophils Absolute: 0 10*3/uL (ref 0.0–0.1)
Basophils Relative: 0 % (ref 0–1)
Eosinophils Absolute: 0.1 10*3/uL (ref 0.0–0.7)
Eosinophils Relative: 3 % (ref 0–5)
LYMPHS ABS: 0.5 10*3/uL — AB (ref 0.7–4.0)
Lymphocytes Relative: 11 % — ABNORMAL LOW (ref 12–46)
MONO ABS: 0.4 10*3/uL (ref 0.1–1.0)
MONOS PCT: 8 % (ref 3–12)
NEUTROS ABS: 3.7 10*3/uL (ref 1.7–7.7)
Neutrophils Relative %: 78 % — ABNORMAL HIGH (ref 43–77)

## 2013-11-09 LAB — MAGNESIUM: Magnesium: 1.9 mg/dL (ref 1.5–2.5)

## 2013-11-09 LAB — LIPASE, BLOOD: Lipase: 656 U/L — ABNORMAL HIGH (ref 11–59)

## 2013-11-09 LAB — PHOSPHORUS: PHOSPHORUS: 1.8 mg/dL — AB (ref 2.3–4.6)

## 2013-11-09 LAB — TRIGLYCERIDES: TRIGLYCERIDES: 160 mg/dL — AB (ref <150)

## 2013-11-09 MED ORDER — FAT EMULSION 20 % IV EMUL
250.0000 mL | INTRAVENOUS | Status: AC
  Administered 2013-11-09: 250 mL via INTRAVENOUS
  Filled 2013-11-09: qty 250

## 2013-11-09 MED ORDER — POTASSIUM PHOSPHATES 15 MMOLE/5ML IV SOLN
30.0000 mmol | Freq: Once | INTRAVENOUS | Status: AC
  Administered 2013-11-09: 30 mmol via INTRAVENOUS
  Filled 2013-11-09: qty 10

## 2013-11-09 MED ORDER — TRACE MINERALS CR-CU-F-FE-I-MN-MO-SE-ZN IV SOLN
INTRAVENOUS | Status: AC
  Administered 2013-11-09: 18:00:00 via INTRAVENOUS
  Filled 2013-11-09: qty 2000

## 2013-11-09 MED ORDER — MAGNESIUM HYDROXIDE 400 MG/5ML PO SUSP
30.0000 mL | Freq: Once | ORAL | Status: AC
  Administered 2013-11-09: 30 mL via ORAL
  Filled 2013-11-09: qty 30

## 2013-11-09 MED ORDER — SODIUM CHLORIDE 0.9 % IV SOLN
INTRAVENOUS | Status: DC
  Administered 2013-11-09: 17:00:00 via INTRAVENOUS

## 2013-11-09 NOTE — Progress Notes (Signed)
PARENTERAL NUTRITION CONSULT NOTE   Pharmacy Consult for TPN Indication: Biliary obstruction 2/2 pancreatitis, failed enteral feeding  No Known Allergies  Patient Measurements: Height: 5' 8.5" (174 cm) Weight: 180 lb 11.2 oz (81.965 kg) IBW/kg (Calculated) : 69.55 Adjusted Body Weight:  Usual Weight:   Vital Signs: Temp: 98.1 F (36.7 C) (05/12 0609) Temp src: Oral (05/12 0609) BP: 140/60 mmHg (05/12 0609) Pulse Rate: 65 (05/12 0609) Intake/Output from previous day: 05/11 0701 - 05/12 0700 In: 962.8 [I.V.:942.1; TPN:20.7] Out: 900 [Urine:900] Intake/Output from this shift:    Labs:  Recent Labs  11/09/13 0535  WBC 4.7  HGB 8.9*  HCT 25.7*  PLT 139*     Recent Labs  11/07/13 0500 11/08/13 0418 11/09/13 0535  NA 147 149* 149*  K 4.2 4.1 3.8  CL 115* 116* 117*  CO2 22 22 23   GLUCOSE 91 93 127*  BUN 27* 29* 30*  CREATININE 1.58* 1.48* 1.33  CALCIUM 8.9 8.9 8.7  MG  --  1.8 1.9  PHOS  --  2.1* 1.8*  PROT 5.3* 5.4* 5.0*  ALBUMIN 1.9* 1.9* 1.8*  AST 169* 170* 158*  ALT 74* 73* 66*  ALKPHOS 1430* 1537* 1404*  BILITOT 10.1* 11.4* 11.5*  TRIG  --   --  160*   Estimated Creatinine Clearance: 38.5 ml/min (by C-G formula based on Cr of 1.33).    Recent Labs  11/08/13 2010 11/09/13 0007  GLUCAP 112* 143*    Medical History: Past Medical History  Diagnosis Date  . Osteoarthritis   . Gout   . Hypertension   . History of coronary artery disease   . GERD (gastroesophageal reflux disease)   . CHF (congestive heart failure)   . Diastolic dysfunction   . History of GI bleed   . History of pacemaker     Medtronic Adapta ADDro1  . Peptic ulcer disease   . BPH (benign prostatic hypertrophy)   . 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 Hyperlipidemia   . History of cerebrovascular disease   . Kidney stone   . Depression   . Chest pain    Insulin Requirements in the past 24 hours:  2 units insulin on sensitive SSI, no hx DM  Nutritional  Goals:  RD recs: 1750-1950 KCal/day, protein 105g/day, fluid 1.7-1.9L/day   Clinimix E5/15 at a goal rate of 67m/hr + IVFE 20% at 120mhr to provide: 100g/day protein, 1894Kcal/day, ~2L fluid/day avg.  Current Nutrition: NPO  IVF: NS at 8583mr  Assessment: [age]F admitted 5/5 with abdominal pain, nausea, and vomiting with recent pancreatitis found to have acute worsening of pancreatitis with elevated LFTs and lipase possibly related to passed gallstones and biliary obstruction.  Multiple unsuccessful attempts at placing feeding tubes, therefore TNA started on 5/11. Risk for re-feeding syndrome as pt was not eating for 1 week PTA and has not been able to receive consistent nutrition inpatient.  ERCP with stent planned for 5/13.    5/12: D2 TNA  Glucose - at goal  Electrolytes - Na remains at 149 slightly elevated, Phos decreased 2.1-->1.8  LFTs - Elevated, small improvement overnight, alk phos also sl decrease   Renal: AoCKD - SCr much improved since admission, today 1.33, unclear baseline  TGs - 160 (5/12), slightly elevated  Prealbumin - pending (5/12)  Plan:  At 1800 today:  Increase to Clinimix E 5/15 at 75m42m with fat emulsion at 10ml72m Plan to advance as tolerated to the goal  rate of 83 ml/hr.  TNA to contain standard multivitamins and trace elements daily  Potassium phosphate 30 mmol (also contains 68mq KCl)   Reduce IVF to 662mhr.  Continue SSI sensitive q4h .   TNA lab panels on Mondays & Thursdays.  Recheck phosphorus tomorrow.   F/u TGs closely  F/u plans for ERCP and clinical course  CoRalene BathePharmD, BCPS 11/09/2013, 7:44 AM  Pager: [phone]

## 2013-11-09 NOTE — Progress Notes (Signed)
Subjective: Having occasional mild nausea, no dyspnea, developing anasarca Objective: Vital signs in last 24 hours: Temp:  [97.4 F (36.3 C)-98.1 F (36.7 C)] 98.1 F (36.7 C) (05/12 0609) Pulse Rate:  [65-69] 65 (05/12 0609) Resp:  [18] 18 (05/12 0609) BP: (140-143)/(56-60) 140/60 mmHg (05/12 0609) SpO2:  [97 %-98 %] 98 % (05/12 0609) Weight change:    Intake/Output from previous day: 05/11 0701 - 05/12 0700 In: 962.8 [I.V.:942.1; TPN:20.7] Out: 900 [Urine:900]   General appearance: alert, cooperative and no distress Resp: clear to auscultation bilaterally Cardio: regular rate and rhythm GI: mild distension, normal bowel sounds, no tenderness Extremities: 2+ edema of both arms  Lab Results:  Recent Labs  11/09/13 0535  WBC 4.7  HGB 8.9*  HCT 25.7*  PLT 139*   BMET  Recent Labs  11/08/13 0418 11/09/13 0535  NA 149* 149*  K 4.1 3.8  CL 116* 117*  CO2 22 23  GLUCOSE 93 127*  BUN 29* 30*  CREATININE 1.48* 1.33  CALCIUM 8.9 8.7   CMET CMP     Component Value Date/Time   NA 149* 11/09/2013 0535   K 3.8 11/09/2013 0535   CL 117* 11/09/2013 0535   CO2 23 11/09/2013 0535   GLUCOSE 127* 11/09/2013 0535   BUN 30* 11/09/2013 0535   CREATININE 1.33 11/09/2013 0535   CALCIUM 8.7 11/09/2013 0535   PROT 5.0* 11/09/2013 0535   ALBUMIN 1.8* 11/09/2013 0535   AST 158* 11/09/2013 0535   ALT 66* 11/09/2013 0535   ALKPHOS 1404* 11/09/2013 0535   BILITOT 11.5* 11/09/2013 0535   GFRNONAA 46* 11/09/2013 0535   GFRAA 54* 11/09/2013 0535    CBG (last 3)   Recent Labs  11/08/13 2010 11/09/13 0007  GLUCAP 112* 143*    INR RESULTS:   Lab Results  Component Value Date   INR 1.06 01/15/2013   INR 0.96 08/14/2011   INR 1.0 08/18/2007     Studies/Results: Dg Abd 1 View  11/07/2013   CLINICAL DATA:  Feeding tube placement.  Acute pancreatitis.  EXAM: ABDOMEN - 1 VIEW  COMPARISON:  11/05/2013  FINDINGS: A feeding tube is seen with tip in the right upper quadrant overlying the  pylorus or duodenal bulb. No evidence of dilated bowel loops. Some residual contrast noted in the right colon.  IMPRESSION: Feeding tube tip overlies the pylorus or duodenal bulb.   Electronically Signed   By: Myles RosenthalJohn  Stahl M.D.   On: 11/07/2013 11:40   Dg Chest Port 1 View  11/08/2013   CLINICAL DATA:  Status post PICC line placement  EXAM: PORTABLE CHEST - 1 VIEW  COMPARISON:  DG CHEST 2 VIEW dated 03/26/2013  FINDINGS: The lungs are reasonably well inflated. The PICC line placed via the right upper extremity has its tip in the region of the proximal to mid SVC. The cardiopericardial silhouette is normal in size. The pulmonary vascularity is not engorged. There is no pneumothorax or pleural effusion. The permanent pacemaker is unchanged in appearance. There are chronic changes associated with the [facility] on the right.  IMPRESSION: There is no evidence of a postprocedure complication following placement of the PICC line via the right upper extremity.   Electronically Signed   By: David  [place]   On: 11/08/2013 13:55    Medications: I have reviewed the patient's current medications.  Assessment/Plan: #1 Pancreatitis: clinically stable with ERCP and probable biliary stent planned for tomorrow #2 Protein Calorie Malnutrition: severe and should improve with TPN #  3 Anemia: moderately severe, asymptomatic #4 Physical Deconditioning: will have him ambulate with assistance   LOS: 8 days   [name] [name] 11/09/2013, 7:53 AM

## 2013-11-09 NOTE — Progress Notes (Signed)
[facility] Progress Note  Subjective: No pain, no nausea no pruritus, somewhat hungry  Objective: Vital signs in last 24 hours: Temp:  [97.4 F (36.3 C)-98.1 F (36.7 C)] 98.1 F (36.7 C) (05/12 0609) Pulse Rate:  [65-69] 65 (05/12 0609) Resp:  [18] 18 (05/12 0609) BP: (140-143)/(56-60) 140/60 mmHg (05/12 0609) SpO2:  [97 %-98 %] 98 % (05/12 0609) Weight change:    PE: Moderate jaundice, abdomen slightly distended minimally tender  Lab Results: Results for orders placed during the hospital encounter of 11/01/13 (from the past 24 hour(s))  GLUCOSE, CAPILLARY     Status: Abnormal   Collection Time    11/08/13  8:10 PM      Result Value Ref Range   Glucose-Capillary 112 (*) 70 - 99 mg/dL  GLUCOSE, CAPILLARY     Status: Abnormal   Collection Time    11/09/13 12:07 AM      Result Value Ref Range   Glucose-Capillary 143 (*) 70 - 99 mg/dL  COMPREHENSIVE METABOLIC PANEL     Status: Abnormal   Collection Time    11/09/13  5:35 AM      Result Value Ref Range   Sodium 149 (*) 137 - 147 mEq/L   Potassium 3.8  3.7 - 5.3 mEq/L   Chloride 117 (*) 96 - 112 mEq/L   CO2 23  19 - 32 mEq/L   Glucose, Bld 127 (*) 70 - 99 mg/dL   BUN 30 (*) 6 - 23 mg/dL   Creatinine, Ser 1.611.33  0.50 - 1.35 mg/dL   Calcium 8.7  8.4 - 09.610.5 mg/dL   Total Protein 5.0 (*) 6.0 - 8.3 g/dL   Albumin 1.8 (*) 3.5 - 5.2 g/dL   AST 045158 (*) 0 - 37 U/L   ALT 66 (*) 0 - 53 U/L   Alkaline Phosphatase 1404 (*) 39 - 117 U/L   Total Bilirubin 11.5 (*) 0.3 - 1.2 mg/dL   GFR calc non Af Amer 46 (*) >90 mL/min   GFR calc Af Amer 54 (*) >90 mL/min  MAGNESIUM     Status: None   Collection Time    11/09/13  5:35 AM      Result Value Ref Range   Magnesium 1.9  1.5 - 2.5 mg/dL  PHOSPHORUS     Status: Abnormal   Collection Time    11/09/13  5:35 AM      Result Value Ref Range   Phosphorus 1.8 (*) 2.3 - 4.6 mg/dL  CBC     Status: Abnormal   Collection Time    11/09/13  5:35 AM      Result Value Ref Range   WBC 4.7  4.0 - 10.5 K/uL   RBC 2.63 (*) 4.22 - 5.81 MIL/uL   Hemoglobin 8.9 (*) 13.0 - 17.0 g/dL   HCT 40.925.7 (*) 81.139.0 - 91.452.0 %   MCV 97.7  78.0 - 100.0 fL   MCH 33.8  26.0 - 34.0 pg   MCHC 34.6  30.0 - 36.0 g/dL   RDW 78.217.4 (*) 95.611.5 - 21.315.5 %   Platelets 139 (*) 150 - 400 K/uL  DIFFERENTIAL     Status: Abnormal   Collection Time    11/09/13  5:35 AM      Result Value Ref Range   Neutrophils Relative % 78 (*) 43 - 77 %   Neutro Abs 3.7  1.7 - 7.7 K/uL   Lymphocytes Relative 11 (*) 12 - 46 %   Lymphs Abs 0.5 (*)  0.7 - 4.0 K/uL   Monocytes Relative 8  3 - 12 %   Monocytes Absolute 0.4  0.1 - 1.0 K/uL   Eosinophils Relative 3  0 - 5 %   Eosinophils Absolute 0.1  0.0 - 0.7 K/uL   Basophils Relative 0  0 - 1 %   Basophils Absolute 0.0  0.0 - 0.1 K/uL  LIPASE, BLOOD     Status: Abnormal   Collection Time    11/09/13  5:35 AM      Result Value Ref Range   Lipase 656 (*) 11 - 59 U/L    Studies/Results: Dg Abd 1 View  11/07/2013   CLINICAL DATA:  Feeding tube placement.  Acute pancreatitis.  EXAM: ABDOMEN - 1 VIEW  COMPARISON:  11/05/2013  FINDINGS: A feeding tube is seen with tip in the right upper quadrant overlying the pylorus or duodenal bulb. No evidence of dilated bowel loops. Some residual contrast noted in the right colon.  IMPRESSION: Feeding tube tip overlies the pylorus or duodenal bulb.   Electronically Signed   By: Myles RosenthalJohn  Stahl M.D.   On: 11/07/2013 11:40   Dg Chest Port 1 View  11/08/2013   CLINICAL DATA:  Status post PICC line placement  EXAM: PORTABLE CHEST - 1 VIEW  COMPARISON:  DG CHEST 2 VIEW dated 03/26/2013  FINDINGS: The lungs are reasonably well inflated. The PICC line placed via the right upper extremity has its tip in the region of the proximal to mid SVC. The cardiopericardial silhouette is normal in size. The pulmonary vascularity is not engorged. There is no pneumothorax or pleural effusion. The permanent pacemaker is unchanged in appearance. There are chronic changes  associated with the [facility] on the right.  IMPRESSION: There is no evidence of a postprocedure complication following placement of the PICC line via the right upper extremity.   Electronically Signed   By: David  [place]   On: 11/08/2013 13:55      Assessment: 1. Pancreatitis, presumed gallstone related 2. Biliary obstruction notably from pancreatitis surrounding the common bile duct  Plan: Have scheduled ERCP with brushings and stent placement for tomorrow. Risks, rationale, and alternatives explained to the patient and daughter and he wished to proceed Continue TPN.    Barrie FolkJohn C Della Homan 11/09/2013, 7:28 AM

## 2013-11-10 ENCOUNTER — Encounter (HOSPITAL_COMMUNITY): Payer: Medicare Other | Admitting: Anesthesiology

## 2013-11-10 ENCOUNTER — Encounter (HOSPITAL_COMMUNITY)
Admission: EM | Disposition: A | Discharge status: Home Health | Payer: Self-pay | Source: Home / Self Care | Attending: Internal Medicine

## 2013-11-10 ENCOUNTER — Encounter (HOSPITAL_COMMUNITY): Payer: Self-pay

## 2013-11-10 ENCOUNTER — Inpatient Hospital Stay (HOSPITAL_COMMUNITY): Payer: Medicare Other | Admitting: Anesthesiology

## 2013-11-10 ENCOUNTER — Inpatient Hospital Stay (HOSPITAL_COMMUNITY): Payer: Medicare Other

## 2013-11-10 HISTORY — PX: ERCP: SHX5425

## 2013-11-10 HISTORY — PX: BILIARY STENT PLACEMENT: SHX5538

## 2013-11-10 LAB — GLUCOSE, CAPILLARY
GLUCOSE-CAPILLARY: 115 mg/dL — AB (ref 70–99)
GLUCOSE-CAPILLARY: 124 mg/dL — AB (ref 70–99)
GLUCOSE-CAPILLARY: 160 mg/dL — AB (ref 70–99)
Glucose-Capillary: 113 mg/dL — ABNORMAL HIGH (ref 70–99)
Glucose-Capillary: 150 mg/dL — ABNORMAL HIGH (ref 70–99)
Glucose-Capillary: 162 mg/dL — ABNORMAL HIGH (ref 70–99)

## 2013-11-10 LAB — COMPREHENSIVE METABOLIC PANEL
ALBUMIN: 1.9 g/dL — AB (ref 3.5–5.2)
ALK PHOS: 1404 U/L — AB (ref 39–117)
ALT: 72 U/L — ABNORMAL HIGH (ref 0–53)
ALT: 73 U/L — AB (ref 0–53)
AST: 171 U/L — ABNORMAL HIGH (ref 0–37)
AST: 173 U/L — AB (ref 0–37)
Albumin: 1.8 g/dL — ABNORMAL LOW (ref 3.5–5.2)
Alkaline Phosphatase: 1437 U/L — ABNORMAL HIGH (ref 39–117)
BUN: 29 mg/dL — AB (ref 6–23)
BUN: 30 mg/dL — ABNORMAL HIGH (ref 6–23)
CALCIUM: 8.6 mg/dL (ref 8.4–10.5)
CHLORIDE: 111 meq/L (ref 96–112)
CO2: 22 mEq/L (ref 19–32)
CO2: 23 meq/L (ref 19–32)
CREATININE: 1.28 mg/dL (ref 0.50–1.35)
Calcium: 8.5 mg/dL (ref 8.4–10.5)
Chloride: 109 mEq/L (ref 96–112)
Creatinine, Ser: 1.27 mg/dL (ref 0.50–1.35)
GFR calc Af Amer: 56 mL/min — ABNORMAL LOW (ref >90)
GFR calc Af Amer: 57 mL/min — ABNORMAL LOW (ref >90)
GFR calc non Af Amer: 49 mL/min — ABNORMAL LOW (ref >90)
GFR, EST NON AFRICAN AMERICAN: 49 mL/min — AB (ref >90)
Glucose, Bld: 124 mg/dL — ABNORMAL HIGH (ref 70–99)
Glucose, Bld: 135 mg/dL — ABNORMAL HIGH (ref 70–99)
POTASSIUM: 3.8 meq/L (ref 3.7–5.3)
POTASSIUM: 4 meq/L (ref 3.7–5.3)
SODIUM: 144 meq/L (ref 137–147)
Sodium: 143 mEq/L (ref 137–147)
TOTAL PROTEIN: 5.6 g/dL — AB (ref 6.0–8.3)
Total Bilirubin: 12.1 mg/dL — ABNORMAL HIGH (ref 0.3–1.2)
Total Bilirubin: 13 mg/dL — ABNORMAL HIGH (ref 0.3–1.2)
Total Protein: 5 g/dL — ABNORMAL LOW (ref 6.0–8.3)

## 2013-11-10 LAB — LIPASE, BLOOD: Lipase: 1134 U/L — ABNORMAL HIGH (ref 11–59)

## 2013-11-10 LAB — PHOSPHORUS: PHOSPHORUS: 2.5 mg/dL (ref 2.3–4.6)

## 2013-11-10 SURGERY — ERCP, WITH INTERVENTION IF INDICATED
Anesthesia: General

## 2013-11-10 SURGERY — ERCP, WITH INTERVENTION IF INDICATED
Anesthesia: Moderate Sedation

## 2013-11-10 MED ORDER — SODIUM CHLORIDE 0.9 % IV SOLN
INTRAVENOUS | Status: DC
  Administered 2013-11-10 – 2013-11-16 (×3): via INTRAVENOUS

## 2013-11-10 MED ORDER — FENTANYL CITRATE 0.05 MG/ML IJ SOLN
INTRAMUSCULAR | Status: DC | PRN
  Administered 2013-11-10 (×2): 50 ug via INTRAVENOUS

## 2013-11-10 MED ORDER — LACTATED RINGERS IV SOLN
INTRAVENOUS | Status: DC | PRN
  Administered 2013-11-10: 12:00:00 via INTRAVENOUS

## 2013-11-10 MED ORDER — FENTANYL CITRATE 0.05 MG/ML IJ SOLN
INTRAMUSCULAR | Status: AC
  Filled 2013-11-10: qty 2

## 2013-11-10 MED ORDER — PROPOFOL 10 MG/ML IV BOLUS
INTRAVENOUS | Status: DC | PRN
  Administered 2013-11-10: 175 mg via INTRAVENOUS
  Administered 2013-11-10: 25 mg via INTRAVENOUS

## 2013-11-10 MED ORDER — GLUCAGON HCL (RDNA) 1 MG IJ SOLR
INTRAMUSCULAR | Status: DC | PRN
  Administered 2013-11-10: .5 mL via INTRAVENOUS

## 2013-11-10 MED ORDER — FENTANYL CITRATE 0.05 MG/ML IJ SOLN
25.0000 ug | INTRAMUSCULAR | Status: DC | PRN
  Administered 2013-11-10: 25 ug via INTRAVENOUS

## 2013-11-10 MED ORDER — ZINC TRACE METAL 1 MG/ML IV SOLN
INTRAVENOUS | Status: AC
  Administered 2013-11-10: 17:00:00 via INTRAVENOUS
  Filled 2013-11-10: qty 2000

## 2013-11-10 MED ORDER — GLUCAGON HCL (RDNA) 1 MG IJ SOLR
INTRAMUSCULAR | Status: AC
  Filled 2013-11-10: qty 2

## 2013-11-10 MED ORDER — IOHEXOL 350 MG/ML SOLN
INTRAVENOUS | Status: DC | PRN
  Administered 2013-11-10: 15:00:00

## 2013-11-10 MED ORDER — LIDOCAINE HCL (CARDIAC) 20 MG/ML IV SOLN
INTRAVENOUS | Status: DC | PRN
  Administered 2013-11-10: 75 mg via INTRAVENOUS

## 2013-11-10 MED ORDER — PROMETHAZINE HCL 25 MG/ML IJ SOLN
INTRAMUSCULAR | Status: AC
  Filled 2013-11-10: qty 1

## 2013-11-10 MED ORDER — LIDOCAINE HCL (CARDIAC) 20 MG/ML IV SOLN
INTRAVENOUS | Status: AC
  Filled 2013-11-10: qty 5

## 2013-11-10 MED ORDER — GLYCOPYRROLATE 0.2 MG/ML IJ SOLN
INTRAMUSCULAR | Status: DC | PRN
  Administered 2013-11-10: .2 mg via INTRAVENOUS

## 2013-11-10 MED ORDER — NEOSTIGMINE METHYLSULFATE 10 MG/10ML IV SOLN
INTRAVENOUS | Status: AC
  Filled 2013-11-10: qty 1

## 2013-11-10 MED ORDER — EPHEDRINE SULFATE 50 MG/ML IJ SOLN
INTRAMUSCULAR | Status: DC | PRN
  Administered 2013-11-10 (×2): 5 mg via INTRAVENOUS

## 2013-11-10 MED ORDER — PROMETHAZINE HCL 25 MG/ML IJ SOLN
6.2500 mg | INTRAMUSCULAR | Status: AC | PRN
  Administered 2013-11-10 (×2): 6.25 mg via INTRAVENOUS

## 2013-11-10 MED ORDER — PROMETHAZINE HCL 25 MG/ML IJ SOLN
6.2500 mg | Freq: Four times a day (QID) | INTRAMUSCULAR | Status: DC | PRN
  Administered 2013-11-10: 6.25 mg via INTRAVENOUS

## 2013-11-10 MED ORDER — SUCCINYLCHOLINE CHLORIDE 20 MG/ML IJ SOLN
INTRAMUSCULAR | Status: DC | PRN
  Administered 2013-11-10: 100 mg via INTRAVENOUS

## 2013-11-10 MED ORDER — ONDANSETRON HCL 4 MG/2ML IJ SOLN
INTRAMUSCULAR | Status: DC | PRN
  Administered 2013-11-10: 4 mg via INTRAVENOUS

## 2013-11-10 MED ORDER — SODIUM CHLORIDE 0.9 % IV SOLN
1.5000 g | Freq: Once | INTRAVENOUS | Status: AC
  Administered 2013-11-10: 1.5 g via INTRAVENOUS
  Filled 2013-11-10: qty 1.5

## 2013-11-10 MED ORDER — GLYCOPYRROLATE 0.2 MG/ML IJ SOLN
INTRAMUSCULAR | Status: AC
  Filled 2013-11-10: qty 3

## 2013-11-10 MED ORDER — NEOSTIGMINE METHYLSULFATE 10 MG/10ML IV SOLN
INTRAVENOUS | Status: DC | PRN
  Administered 2013-11-10: 2 mg via INTRAVENOUS
  Administered 2013-11-10: 1 mg via INTRAVENOUS

## 2013-11-10 MED ORDER — CISATRACURIUM BESYLATE (PF) 10 MG/5ML IV SOLN
INTRAVENOUS | Status: DC | PRN
  Administered 2013-11-10: 4 mg via INTRAVENOUS

## 2013-11-10 MED ORDER — FAT EMULSION 20 % IV EMUL
240.0000 mL | INTRAVENOUS | Status: AC
  Administered 2013-11-10: 240 mL via INTRAVENOUS
  Administered 2013-11-10: 10 mL via INTRAVENOUS
  Filled 2013-11-10: qty 250

## 2013-11-10 MED ORDER — PROPOFOL 10 MG/ML IV BOLUS
INTRAVENOUS | Status: AC
  Filled 2013-11-10: qty 20

## 2013-11-10 MED ORDER — DEXAMETHASONE SODIUM PHOSPHATE 10 MG/ML IJ SOLN
INTRAMUSCULAR | Status: DC | PRN
  Administered 2013-11-10: 10 mg via INTRAVENOUS

## 2013-11-10 SURGICAL SUPPLY — 1 items: Biliary Stent ×2 IMPLANT

## 2013-11-10 NOTE — Progress Notes (Signed)
[facility] Progress Note  Subjective: States he feels better since TPN started, but tolerated some clear liquid diet yesterday, no abdominal pain nausea or pruritus Objective: Vital signs in last 24 hours: Temp:  [97.8 F (36.6 C)-97.9 F (36.6 C)] 97.9 F (36.6 C) (05/13 0358) Pulse Rate:  [61-76] 70 (05/13 0358) Resp:  [16-20] 16 (05/13 0358) BP: (141-147)/(57-91) 145/57 mmHg (05/13 0358) SpO2:  [98 %-100 %] 99 % (05/13 0358) Weight change:    PE: Very icteric alert oriented no acute distress abdomen symmetrically distended soft basically nontender  Lab Results: Results for orders placed during the hospital encounter of 11/01/13 (from the past 24 hour(s))  GLUCOSE, CAPILLARY     Status: Abnormal   Collection Time    11/09/13 11:53 AM      Result Value Ref Range   Glucose-Capillary 143 (*) 70 - 99 mg/dL  GLUCOSE, CAPILLARY     Status: Abnormal   Collection Time    11/09/13  5:13 PM      Result Value Ref Range   Glucose-Capillary 124 (*) 70 - 99 mg/dL  GLUCOSE, CAPILLARY     Status: Abnormal   Collection Time    11/09/13  8:04 PM      Result Value Ref Range   Glucose-Capillary 123 (*) 70 - 99 mg/dL  GLUCOSE, CAPILLARY     Status: Abnormal   Collection Time    11/09/13 11:40 PM      Result Value Ref Range   Glucose-Capillary 132 (*) 70 - 99 mg/dL   Comment 1 Documented in Chart     Comment 2 Notify RN    COMPREHENSIVE METABOLIC PANEL     Status: Abnormal   Collection Time    11/10/13  5:00 AM      Result Value Ref Range   Sodium 143  137 - 147 mEq/L   Potassium 4.0  3.7 - 5.3 mEq/L   Chloride 111  96 - 112 mEq/L   CO2 23  19 - 32 mEq/L   Glucose, Bld 124 (*) 70 - 99 mg/dL   BUN 29 (*) 6 - 23 mg/dL   Creatinine, Ser 3.081.28  0.50 - 1.35 mg/dL   Calcium 8.5  8.4 - 65.710.5 mg/dL   Total Protein 5.0 (*) 6.0 - 8.3 g/dL   Albumin 1.8 (*) 3.5 - 5.2 g/dL   AST 846173 (*) 0 - 37 U/L   ALT 72 (*) 0 - 53 U/L   Alkaline Phosphatase 1404 (*) 39 - 117 U/L   Total  Bilirubin 12.1 (*) 0.3 - 1.2 mg/dL   GFR calc non Af Amer 49 (*) >90 mL/min   GFR calc Af Amer 56 (*) >90 mL/min  LIPASE, BLOOD     Status: Abnormal   Collection Time    11/10/13  5:00 AM      Result Value Ref Range   Lipase 1134 (*) 11 - 59 U/L  PHOSPHORUS     Status: None   Collection Time    11/10/13  5:00 AM      Result Value Ref Range   Phosphorus 2.5  2.3 - 4.6 mg/dL  GLUCOSE, CAPILLARY     Status: Abnormal   Collection Time    11/10/13  7:57 AM      Result Value Ref Range   Glucose-Capillary 124 (*) 70 - 99 mg/dL    Studies/Results: Dg Chest Port 1 View  11/08/2013   CLINICAL DATA:  Status post PICC line placement  EXAM: PORTABLE  CHEST - 1 VIEW  COMPARISON:  DG CHEST 2 VIEW dated 03/26/2013  FINDINGS: The lungs are reasonably well inflated. The PICC line placed via the right upper extremity has its tip in the region of the proximal to mid SVC. The cardiopericardial silhouette is normal in size. The pulmonary vascularity is not engorged. There is no pneumothorax or pleural effusion. The permanent pacemaker is unchanged in appearance. There are chronic changes associated with the [facility] the right.  IMPRESSION: There is no evidence of a postprocedure complication following placement of the PICC line via the right upper extremity.   Electronically Signed   By: David  [place]   On: 11/08/2013 13:55      Assessment: 1. Pancreatitis, presumed from passed common duct stone although this is not clear 2. Biliary obstruction presumed  inflammatory stricture from pancreatitis, not improving over time  Plan: 1. Continue TPN 2. Will proceed with ERCP today with primary goal of placing common bile duct stent and possibly obtaining biliary brushings to rule out malignancy which would appear unlikely    [name] [name] 11/10/2013, 8:24 AM

## 2013-11-10 NOTE — Progress Notes (Signed)
PT Cancellation Note  Patient Details Name: [name] [name] MRN: [id number] DOB: Jan 25, 1924   Cancelled Treatment:    Reason Eval/Treat Not Completed: Patient at procedure or test/unavailable (ERCP)   Lynnell CatalanKatherine E Una Yeomans 11/10/2013, 12:42 PM Zenovia JarredKati Eva Griffo, PT, DPT 11/10/2013 Pager: [phone]

## 2013-11-10 NOTE — Progress Notes (Signed)
Patient feeling okay today and, no new complaints, specifically no abdominal pain nausea or itching. ERCP revealed a smooth distal common bile duct stricture with proximal dilatation presumably from surrounding pancreatitis. Brushings were taken and a common bile stent placed bridging the stricture. Will monitor for potential complications and follow liver function tests for hopeful decompression of his biliary obstruction.

## 2013-11-10 NOTE — Transfer of Care (Signed)
Immediate Anesthesia Transfer of Care Note  Patient: [name] [name]  Procedure(s) Performed: Procedure(s): ENDOSCOPIC RETROGRADE CHOLANGIOPANCREATOGRAPHY (ERCP) (N/A) BILIARY STENT PLACEMENT (N/A)  Patient Location: PACU  Anesthesia Type:General  Level of Consciousness: awake, oriented, patient cooperative, lethargic and responds to stimulation  Airway & Oxygen Therapy: Patient Spontanous Breathing and Patient connected to face mask oxygen  Post-op Assessment: Report given to PACU RN, Post -op Vital signs reviewed and stable and Patient moving all extremities  Post vital signs: Reviewed and stable  Complications: No apparent anesthesia complications

## 2013-11-10 NOTE — Interval H&P Note (Signed)
History and Physical Interval Note:  11/10/2013 12:09 PM  [name] [name]  has presented today for surgery, with the diagnosis of bile duct obstruction  The various methods of treatment have been discussed with the patient and family. After consideration of risks, benefits and other options for treatment, the patient has consented to  Procedure(s): ENDOSCOPIC RETROGRADE CHOLANGIOPANCREATOGRAPHY (ERCP) (N/A) BILIARY STENT PLACEMENT (N/A) as a surgical intervention .  The patient's history has been reviewed, patient examined, no change in status, stable for surgery.  I have reviewed the patient's chart and labs.  Questions were answered to the patient's satisfaction.     Barrie FolkJohn C Quincee Gittens

## 2013-11-10 NOTE — Anesthesia Postprocedure Evaluation (Signed)
  Anesthesia Post-op Note  Patient: Pietro Cassishomas E Corvera  Procedure(s) Performed: Procedure(s) (LRB): ENDOSCOPIC RETROGRADE CHOLANGIOPANCREATOGRAPHY (ERCP) (N/A) BILIARY STENT PLACEMENT (N/A)  Patient Location: PACU  Anesthesia Type: General  Level of Consciousness: awake and alert   Airway and Oxygen Therapy: Patient Spontanous Breathing  Post-op Pain: mild  Post-op Assessment: Post-op Vital signs reviewed, Patient's Cardiovascular Status Stable, Respiratory Function Stable, Patent Airway and No signs of Nausea or vomiting  Last Vitals:  Filed Vitals:   11/10/13 1545  BP: 147/64  Pulse: 67  Temp: 36.4 C  Resp: 18    Post-op Vital Signs: stable   Complications: No apparent anesthesia complications

## 2013-11-10 NOTE — Anesthesia Procedure Notes (Signed)
Procedure Name: Intubation Date/Time: 11/10/2013 12:54 PM Performed by: Edison PaceGRAY, Haston Casebolt E Pre-anesthesia Checklist: Patient identified, Timeout performed, Emergency Drugs available, Suction available and Patient being monitored Patient Re-evaluated:Patient Re-evaluated prior to inductionOxygen Delivery Method: Circle system utilized Preoxygenation: Pre-oxygenation with 100% oxygen Intubation Type: IV induction and Cricoid Pressure applied Ventilation: Mask ventilation without difficulty Laryngoscope Size: Mac and 4 Tube type: Glide rite Tube size: 7.5 mm Number of attempts: 1 Airway Equipment and Method: Video-laryngoscopy (elective glidescope due to limited oral opening, small mouth) Placement Confirmation: ETT inserted through vocal cords under direct vision,  positive ETCO2 and breath sounds checked- equal and bilateral Secured at: 24 cm Tube secured with: Tape Dental Injury: Teeth and Oropharynx as per pre-operative assessment  Difficulty Due To: Difficulty was anticipated, Difficult Airway- due to reduced neck mobility, Difficult Airway- due to dentition, Difficult Airway- due to limited oral opening and Difficult Airway- due to anterior larynx Future Recommendations: Recommend- induction with short-acting agent, and alternative techniques readily available

## 2013-11-10 NOTE — Anesthesia Preprocedure Evaluation (Addendum)
Anesthesia Evaluation  Patient identified by MRN, date of birth, ID band Patient awake    Reviewed: Allergy & Precautions, H&P , NPO status , Patient's Chart, lab work & pertinent test results  Airway Mallampati: II TM Distance: >3 FB Neck ROM: Full    Dental no notable dental hx.    Pulmonary shortness of breath, former smoker,  breath sounds clear to auscultation  Pulmonary exam normal       Cardiovascular hypertension, Pt. on medications + CAD, + Peripheral Vascular Disease and +CHF + dysrhythmias + pacemaker + Valvular Problems/Murmurs AS Rhythm:Regular Rate:Normal + Systolic murmurs H/O complete heart block.  Diastolic heart failure.  Recent cardiology office visit on 10-11-13 reviewed.  He is having no chest pains recently. Does have DOE which is being worked up and he was to have a stress test.   Neuro/Psych PSYCHIATRIC DISORDERS Depression negative neurological ROS     GI/Hepatic Neg liver ROS, PUD, GERD-  Medicated,  Endo/Other  negative endocrine ROS  Renal/GU CRFRenal diseaseChronic kidney disease stage 3.  negative genitourinary   Musculoskeletal negative musculoskeletal ROS (+)   Abdominal   Peds negative pediatric ROS (+)  Hematology negative hematology ROS (+)   Anesthesia Other Findings   Reproductive/Obstetrics negative OB ROS                        Anesthesia Physical Anesthesia Plan  ASA: III  Anesthesia Plan: General   Post-op Pain Management:    Induction: Intravenous  Airway Management Planned: Oral ETT  Additional Equipment:   Intra-op Plan:   Post-operative Plan: Extubation in OR  Informed Consent: I have reviewed the patients History and Physical, chart, labs and discussed the procedure including the risks, benefits and alternatives for the proposed anesthesia with the patient or authorized representative who has indicated his/her understanding and  acceptance.   Dental advisory given  Plan Discussed with: CRNA  Anesthesia Plan Comments: (At increased risk due to comorbidities and DOE which hasn't been fully worked up. He is jaundiced and needs his procedure today. )       Anesthesia Quick Evaluation

## 2013-11-10 NOTE — Progress Notes (Signed)
PARENTERAL NUTRITION CONSULT NOTE   Pharmacy Consult for TPN Indication: Biliary obstruction 2/2 pancreatitis, failed enteral feeding  No Known Allergies  Patient Measurements: Height: 5' 8.5" (174 cm) Weight: 180 lb 11.2 oz (81.965 kg) IBW/kg (Calculated) : 69.55 Adjusted Body Weight:  Usual Weight:  Pt thinks he's lost 12 pounds in the past 3 weeks   Vital Signs: Temp: 97.9 F (36.6 C) (05/13 0358) Temp src: Oral (05/13 0358) BP: 145/57 mmHg (05/13 0358) Pulse Rate: 70 (05/13 0358) Intake/Output from previous day: 05/12 0701 - 05/13 0700 In: 4347.4 [P.O.:1410; I.V.:1169.9; IV Piggyback:510; TPN:1257.5] Out: 175 [Urine:175] Intake/Output from this shift: Total I/O In: 0  Out: 500 [Urine:500]  Labs:  Recent Labs  11/09/13 0535  WBC 4.7  HGB 8.9*  HCT 25.7*  PLT 139*     Recent Labs  11/08/13 0418 11/09/13 11/09/13 0535 11/10/13 0500  NA 149* 144 149* 143  K 4.1 3.8 3.8 4.0  CL 116* 109 117* 111  CO2 22 22 23 23  GLUCOSE 93 135* 127* 124*  BUN 29* 30* 30* 29*  CREATININE 1.48* 1.27 1.33 1.28  CALCIUM 8.9 8.6 8.7 8.5  MG 1.8  --  1.9  --   PHOS 2.1*  --  1.8* 2.5  PROT 5.4* 5.6* 5.0* 5.0*  ALBUMIN 1.9* 1.9* 1.8* 1.8*  AST 170* 171* 158* 173*  ALT 73* 73* 66* 72*  ALKPHOS 1537* 1437* 1404* 1404*  BILITOT 11.4* 13.0* 11.5* 12.1*  PREALBUMIN  --   --  14.2*  --   TRIG  --   --  160*  --    Estimated Creatinine Clearance: 40 ml/min (by C-G formula based on Cr of 1.28).    Recent Labs  11/09/13 2004 11/09/13 2340 11/10/13 0757  GLUCAP 123* 132* 124*    Medical History: Past Medical History  Diagnosis Date  . Osteoarthritis   . Gout   . Hypertension   . History of coronary artery disease   . GERD (gastroesophageal reflux disease)   . CHF (congestive heart failure)   . Diastolic dysfunction   . History of GI bleed   . History of pacemaker     Medtronic Adapta ADDro1  . Peptic ulcer disease   . BPH (benign prostatic hypertrophy)   .  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 Hyperlipidemia   . History of cerebrovascular disease   . Kidney stone   . Depression   . Chest pain    Insulin Requirements in the past 24 hours:  5 units insulin on sensitive SSI, no hx DM  Nutritional Goals:  RD recs: 1750-1950 KCal/day, protein 105g/day, fluid 1.7-1.9L/day   Clinimix E5/15 at a goal rate of 83ml/hr + IVFE 20% at 10ml/hr to provide: 100g/day protein, 1894Kcal/day, ~2L fluid/day avg.  Current Nutrition: NPO for ERCP, tolerated some CLD yesterday  IVF: NS at 65ml/hr  Assessment: 89 yoF admitted 5/5 with abdominal pain, nausea, and vomiting with recent pancreatitis found to have acute worsening of pancreatitis with elevated LFTs and lipase possibly related to passed gallstones and biliary obstruction.  Multiple unsuccessful attempts at placing feeding tubes, therefore TNA started on 5/11. Risk for re-feeding syndrome as pt was not eating for 1 week PTA and has not been able to receive consistent nutrition inpatient.  ERCP with stent planned for 5/13.    TNA access: PICC placed 11/08/13  5/13: D3 TNA  Glucose - at goal  Electrolytes - Phos 2.5 after replacement yesterday.    Other lytes ok.  LFTs - AST/ALT increased, Alk Phos remains elevated, pt jaundiced  Renal: AoCKD - SCr improved since admission, today 1.28, unclear baseline.  BUN 29.  TGs - 160 (5/12), slightly elevated  Prealbumin - 14.2 (5/12)  GI to proceed with ERCP today with primary goal of placing common bile duct stent and possibly obtaining biliary brushings to rule out malignancy.  Plan:  At 1800 today:  Increase Clinimix E 5/15 to goal at 83ml/hr with fat emulsion at 10ml/hr.  TNA to contain standard multivitamins daily.  Removing trace elements for Tbili>6.0, adding Zinc, Chromium, Selenium daily.  Reduce IVF to KVO to account for volume provided by TNA.  Continue SSI sensitive q4h.   TNA lab panels on Mondays & Thursdays.    Pt was  tolerating CLD yesterday.  F/u resumption and tolerance of diet following procedure today.  Amanda Runyon, PharmD, BCPS Pager: [phone] 11/10/2013 9:32 AM      

## 2013-11-10 NOTE — Op Note (Signed)
[facility] [address] [place], [id number]   ERCP PROCEDURE REPORT  PATIENT: [name] [name], [name]  MR# :[id number] BIRTHDATE: 1923-11-20  GENDER: Male ENDOSCOPIST: Dorena CookeyJohn Janequa Kipnis, MD REFERRED BY: PROCEDURE DATE:  11/10/2013 PROCEDURE: ASA CLASS: INDICATIONS:bile duct obstruction thought secondary to pancreatitis MEDICATIONS: general anesthesia TOPICAL ANESTHETIC:  DESCRIPTION OF PROCEDURE:   After the risks benefits and alternatives of the procedure were thoroughly explained, informed consent was obtained.  The Pentax side-viewing       endoscope was introduced through the mouth  and advanced to the papilla of Vater. It had a normal appearance. Initial shallow and and then deep cannulation with the guidewire entered the pancreatic duct. With repositioning selective cannulation of the common bile duct was achieved. There appeared to be a 2-3 cm smooth distal stricture about 1 cm from the ampulla. There was some proximal common hepatic and intrahepatic ductal dilatation above with no obvious filling defects or other irregularities. The gallbladder was absent.a small sphincterotomy was made with the sphincterotome.initial attempts at obtaining brushings with a brush catheter through the stricture were unsuccessful. A 6 mm dilating catheter was inflated within the stricture to 6 mm and held for 30 seconds. A brush catheter was then used to obtain specimens for cytology.  afterwards a 5 cm, 10 French plastic stent was placed through the sphincterotomized ampulla beyond the stricture there was not immediate flow of bile from the stricture but injection of dye after the stent was placed seemed to indicate that the proximal end of the stent had traversed the proximal end of the stricture..  The scope was then completely withdrawn from the patient and the procedure terminated.     COMPLICATIONS: none immediate  ENDOSCOPIC IMPRESSION:MID to distal common bile duct  stricture presumably from pancreatitis, status post brushing and stent placement  RECOMMENDATIONS:await results of brushings and monitor liver function tests for hopeful decompression of biliary obstruction.     _______________________________ Rosalie DoctoreSignedDorena Cookey:  Dayan Kreis, MD 11/10/2013 2:27 PM   CC:

## 2013-11-10 NOTE — Progress Notes (Signed)
Date for 0000 5/13 lab work was dated for 5/12 0000. Lab called to say that since the data is already in the chart it cannot be changed. Please look at lab work for 5/12 0000 in order to assess most current labs. Will notify day shift RN.

## 2013-11-10 NOTE — H&P (View-Only) (Signed)
PARENTERAL NUTRITION CONSULT NOTE   Pharmacy Consult for TPN Indication: Biliary obstruction 2/2 pancreatitis, failed enteral feeding  No Known Allergies  Patient Measurements: Height: 5' 8.5" (174 cm) Weight: 180 lb 11.2 oz (81.965 kg) IBW/kg (Calculated) : 69.55 Adjusted Body Weight:  Usual Weight:  Pt thinks he's lost 12 pounds in the past 3 weeks   Vital Signs: Temp: 97.9 F (36.6 C) (05/13 0358) Temp src: Oral (05/13 0358) BP: 145/57 mmHg (05/13 0358) Pulse Rate: 70 (05/13 0358) Intake/Output from previous day: 05/12 0701 - 05/13 0700 In: 4347.4 [P.O.:1410; I.V.:1169.9; IV Piggyback:510; TPN:1257.5] Out: 175 [Urine:175] Intake/Output from this shift: Total I/O In: 0  Out: 500 [Urine:500]  Labs:  Recent Labs  11/09/13 0535  WBC 4.7  HGB 8.9*  HCT 25.7*  PLT 139*     Recent Labs  11/08/13 0418 11/09/13 11/09/13 0535 11/10/13 0500  NA 149* 144 149* 143  K 4.1 3.8 3.8 4.0  CL 116* 109 117* 111  CO2 22 22 23 23  GLUCOSE 93 135* 127* 124*  BUN 29* 30* 30* 29*  CREATININE 1.48* 1.27 1.33 1.28  CALCIUM 8.9 8.6 8.7 8.5  MG 1.8  --  1.9  --   PHOS 2.1*  --  1.8* 2.5  PROT 5.4* 5.6* 5.0* 5.0*  ALBUMIN 1.9* 1.9* 1.8* 1.8*  AST 170* 171* 158* 173*  ALT 73* 73* 66* 72*  ALKPHOS 1537* 1437* 1404* 1404*  BILITOT 11.4* 13.0* 11.5* 12.1*  PREALBUMIN  --   --  14.2*  --   TRIG  --   --  160*  --    Estimated Creatinine Clearance: 40 ml/min (by C-G formula based on Cr of 1.28).    Recent Labs  11/09/13 2004 11/09/13 2340 11/10/13 0757  GLUCAP 123* 132* 124*    Medical History: Past Medical History  Diagnosis Date  . Osteoarthritis   . Gout   . Hypertension   . History of coronary artery disease   . GERD (gastroesophageal reflux disease)   . CHF (congestive heart failure)   . Diastolic dysfunction   . History of GI bleed   . History of pacemaker     Medtronic Adapta ADDro1  . Peptic ulcer disease   . BPH (benign prostatic hypertrophy)   .  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 Hyperlipidemia   . History of cerebrovascular disease   . Kidney stone   . Depression   . Chest pain    Insulin Requirements in the past 24 hours:  5 units insulin on sensitive SSI, no hx DM  Nutritional Goals:  RD recs: 1750-1950 KCal/day, protein 105g/day, fluid 1.7-1.9L/day   Clinimix E5/15 at a goal rate of 83ml/hr + IVFE 20% at 10ml/hr to provide: 100g/day protein, 1894Kcal/day, ~2L fluid/day avg.  Current Nutrition: NPO for ERCP, tolerated some CLD yesterday  IVF: NS at 65ml/hr  Assessment: 89 yoF admitted 5/5 with abdominal pain, nausea, and vomiting with recent pancreatitis found to have acute worsening of pancreatitis with elevated LFTs and lipase possibly related to passed gallstones and biliary obstruction.  Multiple unsuccessful attempts at placing feeding tubes, therefore TNA started on 5/11. Risk for re-feeding syndrome as pt was not eating for 1 week PTA and has not been able to receive consistent nutrition inpatient.  ERCP with stent planned for 5/13.    TNA access: PICC placed 11/08/13  5/13: D3 TNA  Glucose - at goal  Electrolytes - Phos 2.5 after replacement yesterday.    Other lytes ok.  LFTs - AST/ALT increased, Alk Phos remains elevated, pt jaundiced  Renal: AoCKD - SCr improved since admission, today 1.28, unclear baseline.  BUN 29.  TGs - 160 (5/12), slightly elevated  Prealbumin - 14.2 (5/12)  GI to proceed with ERCP today with primary goal of placing common bile duct stent and possibly obtaining biliary brushings to rule out malignancy.  Plan:  At 1800 today:  Increase Clinimix E 5/15 to goal at 83ml/hr with fat emulsion at 10ml/hr.  TNA to contain standard multivitamins daily.  Removing trace elements for Tbili>6.0, adding Zinc, Chromium, Selenium daily.  Reduce IVF to KVO to account for volume provided by TNA.  Continue SSI sensitive q4h.   TNA lab panels on Mondays & Thursdays.    Pt was  tolerating CLD yesterday.  F/u resumption and tolerance of diet following procedure today.  Caroleena Paolini, PharmD, BCPS Pager: [phone] 11/10/2013 9:32 AM      

## 2013-11-10 NOTE — Progress Notes (Signed)
Subjective: Intermittent mild epigastric pain and nausea, no dyspnea  Objective: Vital signs in last 24 hours: Temp:  [97.8 F (36.6 C)-97.9 F (36.6 C)] 97.9 F (36.6 C) (05/13 0358) Pulse Rate:  [61-76] 70 (05/13 0358) Resp:  [16-20] 16 (05/13 0358) BP: (141-147)/(57-91) 145/57 mmHg (05/13 0358) SpO2:  [98 %-100 %] 99 % (05/13 0358) Weight change:    Intake/Output from previous day: 05/12 0701 - 05/13 0700 In: 4347.4 [P.O.:1410; I.V.:1169.9; IV Piggyback:510; TPN:1257.5] Out: 175 [Urine:175]   General appearance: alert, cooperative and with moderate jaundice Resp: clear to auscultation bilaterally Cardio: regular rate and rhythm GI: soft, non-tender; bowel sounds normal; no masses,  no organomegaly Extremities: 2+ edema of both upper extremities alert and answers questions well  Lab Results:  Recent Labs  11/09/13 0535  WBC 4.7  HGB 8.9*  HCT 25.7*  PLT 139*   BMET  Recent Labs  11/09/13 0535 11/10/13 0500  NA 149* 143  K 3.8 4.0  CL 117* 111  CO2 23 23  GLUCOSE 127* 124*  BUN 30* 29*  CREATININE 1.33 1.28  CALCIUM 8.7 8.5   CMET CMP     Component Value Date/Time   NA 143 11/10/2013 0500   K 4.0 11/10/2013 0500   CL 111 11/10/2013 0500   CO2 23 11/10/2013 0500   GLUCOSE 124* 11/10/2013 0500   BUN 29* 11/10/2013 0500   CREATININE 1.28 11/10/2013 0500   CALCIUM 8.5 11/10/2013 0500   PROT 5.0* 11/10/2013 0500   ALBUMIN 1.8* 11/10/2013 0500   AST 173* 11/10/2013 0500   ALT 72* 11/10/2013 0500   ALKPHOS 1404* 11/10/2013 0500   BILITOT 12.1* 11/10/2013 0500   GFRNONAA 49* 11/10/2013 0500   GFRAA 56* 11/10/2013 0500    CBG (last 3)   Recent Labs  11/09/13 1713 11/09/13 2004 11/09/13 2340  GLUCAP 124* 123* 132*    INR RESULTS:   Lab Results  Component Value Date   INR 1.06 01/15/2013   INR 0.96 08/14/2011   INR 1.0 08/18/2007     Studies/Results: Dg Chest Port 1 View  11/08/2013   CLINICAL DATA:  Status post PICC line placement  EXAM: PORTABLE  CHEST - 1 VIEW  COMPARISON:  DG CHEST 2 VIEW dated 03/26/2013  FINDINGS: The lungs are reasonably well inflated. The PICC line placed via the right upper extremity has its tip in the region of the proximal to mid SVC. The cardiopericardial silhouette is normal in size. The pulmonary vascularity is not engorged. There is no pneumothorax or pleural effusion. The permanent pacemaker is unchanged in appearance. There are chronic changes associated with the [facility] on the right.  IMPRESSION: There is no evidence of a postprocedure complication following placement of the PICC line via the right upper extremity.   Electronically Signed   By: David  [place]   On: 11/08/2013 13:55    Medications: I have reviewed the patient's current medications.  Assessment/Plan: #1 Jaundice and Pancreatitis: increased bilirubin due to obstruction from pancreatic inflammation, plan is for ERCP with biopsy and likely stent placement later today #2 Protein Calorie Malnutrition: severe and improving with TPN   LOS: 9 days   Jarome MatinDaniel Sunshyne Horvath 11/10/2013, 7:47 AM

## 2013-11-11 ENCOUNTER — Inpatient Hospital Stay (HOSPITAL_COMMUNITY): Payer: Medicare Other

## 2013-11-11 ENCOUNTER — Encounter (HOSPITAL_COMMUNITY): Payer: Self-pay | Admitting: Gastroenterology

## 2013-11-11 LAB — PHOSPHORUS: Phosphorus: 2.1 mg/dL — ABNORMAL LOW (ref 2.3–4.6)

## 2013-11-11 LAB — COMPREHENSIVE METABOLIC PANEL WITH GFR
ALT: 63 U/L — ABNORMAL HIGH (ref 0–53)
AST: 120 U/L — ABNORMAL HIGH (ref 0–37)
Albumin: 1.7 g/dL — ABNORMAL LOW (ref 3.5–5.2)
Alkaline Phosphatase: 1121 U/L — ABNORMAL HIGH (ref 39–117)
BUN: 32 mg/dL — ABNORMAL HIGH (ref 6–23)
CO2: 23 meq/L (ref 19–32)
Calcium: 8.5 mg/dL (ref 8.4–10.5)
Chloride: 110 meq/L (ref 96–112)
Creatinine, Ser: 1.25 mg/dL (ref 0.50–1.35)
GFR calc Af Amer: 58 mL/min — ABNORMAL LOW
GFR calc non Af Amer: 50 mL/min — ABNORMAL LOW
Glucose, Bld: 182 mg/dL — ABNORMAL HIGH (ref 70–99)
Potassium: 3.9 meq/L (ref 3.7–5.3)
Sodium: 142 meq/L (ref 137–147)
Total Bilirubin: 7.3 mg/dL — ABNORMAL HIGH (ref 0.3–1.2)
Total Protein: 5 g/dL — ABNORMAL LOW (ref 6.0–8.3)

## 2013-11-11 LAB — COMPREHENSIVE METABOLIC PANEL
ALBUMIN: 1.6 g/dL — AB (ref 3.5–5.2)
ALK PHOS: 1188 U/L — AB (ref 39–117)
ALT: 66 U/L — AB (ref 0–53)
AST: 137 U/L — ABNORMAL HIGH (ref 0–37)
BILIRUBIN TOTAL: 9.3 mg/dL — AB (ref 0.3–1.2)
BUN: 30 mg/dL — ABNORMAL HIGH (ref 6–23)
CHLORIDE: 109 meq/L (ref 96–112)
CO2: 24 mEq/L (ref 19–32)
Calcium: 8.3 mg/dL — ABNORMAL LOW (ref 8.4–10.5)
Creatinine, Ser: 1.22 mg/dL (ref 0.50–1.35)
GFR calc Af Amer: 60 mL/min — ABNORMAL LOW (ref >90)
GFR calc non Af Amer: 51 mL/min — ABNORMAL LOW (ref >90)
Glucose, Bld: 245 mg/dL — ABNORMAL HIGH (ref 70–99)
POTASSIUM: 4.1 meq/L (ref 3.7–5.3)
SODIUM: 142 meq/L (ref 137–147)
Total Protein: 5.2 g/dL — ABNORMAL LOW (ref 6.0–8.3)

## 2013-11-11 LAB — GLUCOSE, CAPILLARY
GLUCOSE-CAPILLARY: 140 mg/dL — AB (ref 70–99)
GLUCOSE-CAPILLARY: 144 mg/dL — AB (ref 70–99)
Glucose-Capillary: 116 mg/dL — ABNORMAL HIGH (ref 70–99)
Glucose-Capillary: 152 mg/dL — ABNORMAL HIGH (ref 70–99)
Glucose-Capillary: 180 mg/dL — ABNORMAL HIGH (ref 70–99)
Glucose-Capillary: 207 mg/dL — ABNORMAL HIGH (ref 70–99)

## 2013-11-11 LAB — MAGNESIUM: Magnesium: 2.1 mg/dL (ref 1.5–2.5)

## 2013-11-11 MED ORDER — IOHEXOL 300 MG/ML  SOLN
25.0000 mL | INTRAMUSCULAR | Status: AC
  Administered 2013-11-11 (×2): 25 mL via ORAL

## 2013-11-11 MED ORDER — ZINC TRACE METAL 1 MG/ML IV SOLN
INTRAVENOUS | Status: AC
  Administered 2013-11-11: 17:00:00 via INTRAVENOUS
  Filled 2013-11-11: qty 2000

## 2013-11-11 MED ORDER — POTASSIUM PHOSPHATES 15 MMOLE/5ML IV SOLN
20.0000 mmol | Freq: Once | INTRAVENOUS | Status: AC
  Administered 2013-11-11: 20 mmol via INTRAVENOUS
  Filled 2013-11-11: qty 6.67

## 2013-11-11 MED ORDER — IOHEXOL 300 MG/ML  SOLN
100.0000 mL | Freq: Once | INTRAMUSCULAR | Status: AC | PRN
Start: 2013-11-11 — End: 2013-11-11
  Administered 2013-11-11: 100 mL via INTRAVENOUS

## 2013-11-11 MED ORDER — ENOXAPARIN SODIUM 40 MG/0.4ML ~~LOC~~ SOLN
40.0000 mg | SUBCUTANEOUS | Status: DC
  Administered 2013-11-11 – 2013-11-17 (×7): 40 mg via SUBCUTANEOUS
  Filled 2013-11-11 (×7): qty 0.4

## 2013-11-11 MED ORDER — BOOST / RESOURCE BREEZE PO LIQD
1.0000 | Freq: Two times a day (BID) | ORAL | Status: DC
  Administered 2013-11-11 – 2013-11-16 (×9): 1 via ORAL

## 2013-11-11 MED ORDER — FAT EMULSION 20 % IV EMUL
240.0000 mL | INTRAVENOUS | Status: AC
Start: 2013-11-11 — End: 2013-11-12
  Administered 2013-11-11: 240 mL via INTRAVENOUS
  Filled 2013-11-11: qty 250

## 2013-11-11 MED ORDER — SPIRONOLACTONE 25 MG PO TABS
25.0000 mg | ORAL_TABLET | Freq: Every day | ORAL | Status: DC
  Administered 2013-11-11 – 2013-11-17 (×7): 25 mg via ORAL
  Filled 2013-11-11 (×7): qty 1

## 2013-11-11 MED ORDER — FUROSEMIDE 40 MG PO TABS
40.0000 mg | ORAL_TABLET | Freq: Every day | ORAL | Status: DC
  Administered 2013-11-11 – 2013-11-17 (×7): 40 mg via ORAL
  Filled 2013-11-11 (×7): qty 1

## 2013-11-11 NOTE — Progress Notes (Signed)
NUTRITION FOLLOW UP  Intervention:   -Recommend Resource Breeze po BID, each supplement provides 250 kcal and 9 grams of protein- monitor CBG's and modify insulin as warranted -TPN per pharmacy -Diet advancement per MD -Will continue to monitor  Nutrition Dx:   Inadequate oral intake related to inability to eat as evidenced by NPO-ongoing, improving with TPN + CL diet  Goal:   Advance diet as tolerated to low fat diet -unmet   Monitor:   TF tolerance, total protein/energy intake, diet advancement, weight, labs  Assessment:   5/05: Pt with hx of recent pancreatitis, admitted with abdominal pain that started yesterday. Pt discussed during multidisciplinary rounds.  -Met with pt who c/o lack of hunger for the past 3 weeks  -Spent the past 7 days consuming 20-30oz of Gatorade, did not eat anything  -Thinks he's lost 12 pounds in the past 3 weeks  -Before previous admission he was eating whatever he wanted to, 3 meals/day, denies any specific high fat foods he regularly consumed  -C/o diarrhea that started yesterday afternoon and denied any vomiting but did c/o nausea in the past week  -Noted MD discussed with pt possible post-pyloric TF, awaiting GI evaluation  BUN/Cr elevated with low GFR  Alk phos, lipase, AST/ALT, and total bilirubin elevated   5/08: -Pt to have panda tube placed on 5/08 and to initiate nasojejunal tube feds -Glucerna 1.0 at 50 ml/hr has been ordered. This regimen will provide 1200 kcal (69% est kcal needs), and 50 gram protein (48% est protein needs) -Would benefit from addition of Pro-Stat 30 ml BID to meet at least 75% estimated nutrition needs -No orders have been placed outlining TF advancement. -Consider modifying to elemental formula (Vital 1.2) if pt exhibits intolerance, diarrhea or steatorrhea -Continue to recommend slow advancement for pt's risk of refeeding -Elevated LFT -K WNL -BUN/Crt elevated d/t AKI on CKD, improving with IVF. Receiving approx 3L  fluid/day  5/11: -Pt with multiple unsuccesful attempts of [place] tube replacement.  -Was unable to receive any enteral nutrition -Pt to have PICC lined placed today per discussion with RN -Per pharmacy note, plan to Start Clinimix E 5/15 at 28m/hr with fat emulsion at 178mhr -Goal rate of Clinimix E5/15 at 83 ml/hr with IVFE 20% at 10 ml/hr will provide 100g/day protein, 1894Kcal/day, ~2L. This will meet 100% est kcal needs and 95% est protein needs  -Discussed pt with pharmacist regarding pt's risk of refeeding. K/Mg WNL, phos slightly low at 2.1. Plan to continue to monitor; electrolytes added in TPN -Elevated LFT -Elevated Lipase -Crt elevated at 1.48 but improved, will continue to monitor with TPN advancement and adjust protein needs as warranted -Glucose WNL  5/14: -Pt tolerating TPN.  -Was advanced to goal rate of Clinimix E5/15 at 83 ml/hr with IVFE 20% at 10 ml/hr.  - This provides 100 g/day protein, 1894Kcal/day (100% est kcal needs and 95% est protein needs) -S/P ERCP. Total bilirubin and Alk phos trending down -Lipase remains elevated -Advanced to clear liquid diet. Pt tolerating >50% soup/jello/juice w/out nausea or abd pain. Was willing to try Resource Breeze supplement for additional kcal/protein. Pt eager for diet advancement. -Pt with elevated CBS's post procedure, no hx of DM. Will monitor post supplementation -Phos 2.1L, being replaced by KPhos. Plan to d/c trace elements and add Zinc, Chromium, Selenium per pharmacy -Mg/K WNL -Pt with hypoactive BS per RN note  Height: Ht Readings from Last 1 Encounters:  11/10/13 5' 8"  (1.727 m)    Weight Status:  Wt Readings from Last 1 Encounters:  11/10/13 180 lb (81.647 kg)    Re-estimated needs:  Kcal: 1750-1950  Protein: 105g  Fluid: 1.7-1.9L/day   Skin: WDL-jaundice  Diet Order: Clear Liquid   Intake/Output Summary (Last 24 hours) at 11/11/13 1542 Last data filed at 11/10/13 1900  Gross per 24 hour  Intake       0 ml  Output      0 ml  Net      0 ml    Last BM: 5/06   Labs:   Recent Labs Lab 11/08/13 0418  11/09/13 0535 11/10/13 0500 11/11/13 0030 11/11/13 0454  NA 149*  < > 149* 143 142 142  K 4.1  < > 3.8 4.0 4.1 3.9  CL 116*  < > 117* 111 109 110  CO2 22  < > 23 23 24 23   BUN 29*  < > 30* 29* 30* 32*  CREATININE 1.48*  < > 1.33 1.28 1.22 1.25  CALCIUM 8.9  < > 8.7 8.5 8.3* 8.5  MG 1.8  --  1.9  --   --  2.1  PHOS 2.1*  --  1.8* 2.5  --  2.1*  GLUCOSE 93  < > 127* 124* 245* 182*  < > = values in this interval not displayed.  CBG (last 3)   Recent Labs  11/11/13 0025 11/11/13 0339 11/11/13 0733  GLUCAP 207* 180* 152*    Scheduled Meds: . enoxaparin (LOVENOX) injection  40 mg Subcutaneous Q24H  . feeding supplement (RESOURCE BREEZE)  1 Container Oral BID  . furosemide  40 mg Oral Daily  . insulin aspart  0-9 Units Subcutaneous Q4H  . pantoprazole (PROTONIX) IV  40 mg Intravenous QHS  . potassium phosphate IVPB (mmol)  20 mmol Intravenous Once  . spironolactone  25 mg Oral Daily    Continuous Infusions: . sodium chloride 10 mL/hr at 11/10/13 1500  . Marland KitchenTPN (CLINIMIX-E) Adult 83 mL/hr at 11/10/13 1716   And  . fat emulsion 240 mL (11/10/13 1716)  . Marland KitchenTPN (CLINIMIX-E) Adult     And  . fat emulsion      Atlee Abide MS RD LDN Clinical Dietitian [phone]

## 2013-11-11 NOTE — Progress Notes (Signed)
Physical Therapy Treatment Patient Details Name: [name] [name] E Noorani MRN: [id number] DOB: 11/24/1923 Today's Date: 11/11/2013    History of Present Illness Pt is an 80 year old male admitted 5/4 for biliary obstruction related to pancreatitis and PHMx of pacemaker, CAD, HTN, gout, CHF, OA.  Pt s/p ERCP 5/13    PT Comments    Pt s/p ERCP yesterday and reports feeling weaker today.  Pt very SOB after ambulation and required support from IV pole this session.   Follow Up Recommendations  Home health PT     Equipment Recommendations  None recommended by PT    Recommendations for Other Services       Precautions / Restrictions Precautions Precautions: Fall    Mobility  Bed Mobility Overal bed mobility: Needs Assistance Bed Mobility: Sit to Supine;Supine to Sit     Supine to sit: Supervision Sit to supine: Min assist   General bed mobility comments: required assist for LEs onto bed  Transfers Overall transfer level: Needs assistance Equipment used: None Transfers: Sit to/from Stand Sit to Stand: Min guard         General transfer comment: min/guard for safety, pt reports feeling weaker since previous visit  Ambulation/Gait Ambulation/Gait assistance: Min guard Ambulation Distance (Feet): 400 Feet Assistive device:  (IV pole) Gait Pattern/deviations: Step-through pattern;Decreased stride length     General Gait Details: pt used IV pole this session to steady himself, states he is feeling weaker since last session, had procedure yesterday, very SOB upon return room however HR and SpO2 WNL   Stairs            Wheelchair Mobility    Modified Rankin (Stroke Patients Only)       Balance                                    Cognition Arousal/Alertness: Awake/alert Behavior During Therapy: WFL for tasks assessed/performed Overall Cognitive Status: Within Functional Limits for tasks assessed                      Exercises       General Comments        Pertinent Vitals/Pain Pt reports back pain with RN in room to give pain meds, pt agreeable to mobilize, SpO2 95-100% room air and HR 90 bpm after ambulation upon return to sitting    Home Living                      Prior Function            PT Goals (current goals can now be found in the care plan section) Progress towards PT goals: Progressing toward goals    Frequency  Min 3X/week    PT Plan Current plan remains appropriate    Co-evaluation             End of Session   Activity Tolerance: Patient limited by fatigue Patient left: in bed;with call bell/phone within reach;with bed alarm set     Time: 1410-1425 PT Time Calculation (min): 15 min  Charges:  $Gait Training: 8-22 mins                    G Codes:      Lynnell CatalanKatherine E Andros Channing 11/11/2013, 4:11 PM Zenovia JarredKati Falisha Osment, PT, DPT 11/11/2013 Pager: [phone]

## 2013-11-11 NOTE — Progress Notes (Signed)
[facility] Progress Note  Subjective: Patient's subjectively feeling better today, no pain the itching or nausea  Objective: Vital signs in last 24 hours: Temp:  [97 F (36.1 C)-98.2 F (36.8 C)] 97.3 F (36.3 C) (05/14 0408) Pulse Rate:  [65-71] 67 (05/14 0408) Resp:  [18-25] 21 (05/14 0408) BP: (140-169)/(57-73) 140/64 mmHg (05/14 0408) SpO2:  [98 %-100 %] 98 % (05/14 0408) Weight:  [81.647 kg (180 lb)] 81.647 kg (180 lb) (05/13 1146) Weight change:    PE: Unchanged, slightly less jaundiced.  Lab Results: Results for orders placed during the hospital encounter of 11/01/13 (from the past 24 hour(s))  GLUCOSE, CAPILLARY     Status: Abnormal   Collection Time    11/10/13  7:57 AM      Result Value Ref Range   Glucose-Capillary 124 (*) 70 - 99 mg/dL  GLUCOSE, CAPILLARY     Status: Abnormal   Collection Time    11/10/13 12:22 PM      Result Value Ref Range   Glucose-Capillary 113 (*) 70 - 99 mg/dL  GLUCOSE, CAPILLARY     Status: Abnormal   Collection Time    11/10/13  3:02 PM      Result Value Ref Range   Glucose-Capillary 160 (*) 70 - 99 mg/dL  GLUCOSE, CAPILLARY     Status: Abnormal   Collection Time    11/10/13  3:47 PM      Result Value Ref Range   Glucose-Capillary 150 (*) 70 - 99 mg/dL  GLUCOSE, CAPILLARY     Status: Abnormal   Collection Time    11/10/13  8:09 PM      Result Value Ref Range   Glucose-Capillary 162 (*) 70 - 99 mg/dL   Comment 1 Documented in Chart     Comment 2 Notify RN    GLUCOSE, CAPILLARY     Status: Abnormal   Collection Time    11/11/13 12:25 AM      Result Value Ref Range   Glucose-Capillary 207 (*) 70 - 99 mg/dL  COMPREHENSIVE METABOLIC PANEL     Status: Abnormal   Collection Time    11/11/13 12:30 AM      Result Value Ref Range   Sodium 142  137 - 147 mEq/L   Potassium 4.1  3.7 - 5.3 mEq/L   Chloride 109  96 - 112 mEq/L   CO2 24  19 - 32 mEq/L   Glucose, Bld 245 (*) 70 - 99 mg/dL   BUN 30 (*) 6 - 23 mg/dL   Creatinine, Ser 0.981.22  0.50 - 1.35 mg/dL   Calcium 8.3 (*) 8.4 - 10.5 mg/dL   Total Protein 5.2 (*) 6.0 - 8.3 g/dL   Albumin 1.6 (*) 3.5 - 5.2 g/dL   AST 119137 (*) 0 - 37 U/L   ALT 66 (*) 0 - 53 U/L   Alkaline Phosphatase 1188 (*) 39 - 117 U/L   Total Bilirubin 9.3 (*) 0.3 - 1.2 mg/dL   GFR calc non Af Amer 51 (*) >90 mL/min   GFR calc Af Amer 60 (*) >90 mL/min  GLUCOSE, CAPILLARY     Status: Abnormal   Collection Time    11/11/13  3:39 AM      Result Value Ref Range   Glucose-Capillary 180 (*) 70 - 99 mg/dL  COMPREHENSIVE METABOLIC PANEL     Status: Abnormal   Collection Time    11/11/13  4:54 AM      Result Value Ref Range  Sodium 142  137 - 147 mEq/L   Potassium 3.9  3.7 - 5.3 mEq/L   Chloride 110  96 - 112 mEq/L   CO2 23  19 - 32 mEq/L   Glucose, Bld 182 (*) 70 - 99 mg/dL   BUN 32 (*) 6 - 23 mg/dL   Creatinine, Ser 1.611.25  0.50 - 1.35 mg/dL   Calcium 8.5  8.4 - 09.610.5 mg/dL   Total Protein 5.0 (*) 6.0 - 8.3 g/dL   Albumin 1.7 (*) 3.5 - 5.2 g/dL   AST 045120 (*) 0 - 37 U/L   ALT 63 (*) 0 - 53 U/L   Alkaline Phosphatase 1121 (*) 39 - 117 U/L   Total Bilirubin 7.3 (*) 0.3 - 1.2 mg/dL   GFR calc non Af Amer 50 (*) >90 mL/min   GFR calc Af Amer 58 (*) >90 mL/min  MAGNESIUM     Status: None   Collection Time    11/11/13  4:54 AM      Result Value Ref Range   Magnesium 2.1  1.5 - 2.5 mg/dL  PHOSPHORUS     Status: Abnormal   Collection Time    11/11/13  4:54 AM      Result Value Ref Range   Phosphorus 2.1 (*) 2.3 - 4.6 mg/dL  GLUCOSE, CAPILLARY     Status: Abnormal   Collection Time    11/11/13  7:33 AM      Result Value Ref Range   Glucose-Capillary 152 (*) 70 - 99 mg/dL    Studies/Results: Dg Ercp Biliary & Pancreatic Ducts  11/10/2013   CLINICAL DATA:  Pancreatitis and biliary dilatation.  EXAM: ERCP  TECHNIQUE: Multiple spot images obtained with the fluoroscopic device and submitted for interpretation post-procedure.  COMPARISON:  11/02/2013  FINDINGS: Images obtained  during ERCP show intrahepatic bile duct dilatation. The common bile duct is mildly increased in caliber. Subsequent images show placement of a biliary stent with decompression of the intrahepatic bile ducts.  IMPRESSION: 1. Status post common bile duct stent placement with decompression of dilated intrahepatic bile ducts.  These images were submitted for radiologic interpretation only. Please see the procedural report for the amount of contrast and the fluoroscopy time utilized.   Electronically Signed   By: Signa Kellaylor  Stroud M.D.   On: 11/10/2013 14:34      Assessment: 1. Pancreatitis, presumed secondary to a passed common bile duct stone with common bile duct stricture, status post ERCP with stent placement yesterday with some fall in bilirubin and alkaline phosphatase. Interestingly, lipase remains quite high despite absence of pain or any obvious new complications other than biliary stricture  1. Plan: 1. Continue TPN and limited clear liquid diet 2. Continue to monitor liver function test. 3. We'll repeat CT scan for progression/improvement in pancreatitis since last imaging has been over a week ago.    Barrie FolkJohn C Allia Wiltsey 11/11/2013, 7:53 AM

## 2013-11-11 NOTE — Progress Notes (Signed)
Subjective: Nausea and abdominal pain have resolved, is having mild dyspnea with conversation today  Objective: Vital signs in last 24 hours: Temp:  [97 F (36.1 C)-98.2 F (36.8 C)] 97.3 F (36.3 C) (05/14 0408) Pulse Rate:  [65-71] 67 (05/14 0408) Resp:  [18-25] 21 (05/14 0408) BP: (140-169)/(57-73) 140/64 mmHg (05/14 0408) SpO2:  [98 %-100 %] 98 % (05/14 0408) Weight:  [81.647 kg (180 lb)] 81.647 kg (180 lb) (05/13 1146) Weight change:    Intake/Output from previous day: 05/13 0701 - 05/14 0700 In: 1531.8 [I.V.:891.8; TPN:640] Out: 500 [Urine:500]   General appearance: alert, cooperative and mild distress Resp: clear to auscultation bilaterally and anteriorly Extremities: bilateral 2+ pitting edema of the upper extremities  Lab Results:  Recent Labs  11/09/13 0535  WBC 4.7  HGB 8.9*  HCT 25.7*  PLT 139*   BMET  Recent Labs  11/11/13 0030 11/11/13 0454  NA 142 142  K 4.1 3.9  CL 109 110  CO2 24 23  GLUCOSE 245* 182*  BUN 30* 32*  CREATININE 1.22 1.25  CALCIUM 8.3* 8.5   CMET CMP     Component Value Date/Time   NA 142 11/11/2013 0454   K 3.9 11/11/2013 0454   CL 110 11/11/2013 0454   CO2 23 11/11/2013 0454   GLUCOSE 182* 11/11/2013 0454   BUN 32* 11/11/2013 0454   CREATININE 1.25 11/11/2013 0454   CALCIUM 8.5 11/11/2013 0454   PROT 5.0* 11/11/2013 0454   ALBUMIN 1.7* 11/11/2013 0454   AST 120* 11/11/2013 0454   ALT 63* 11/11/2013 0454   ALKPHOS 1121* 11/11/2013 0454   BILITOT 7.3* 11/11/2013 0454   GFRNONAA 50* 11/11/2013 0454   GFRAA 58* 11/11/2013 0454    CBG (last 3)   Recent Labs  11/10/13 2009 11/11/13 0025 11/11/13 0339  GLUCAP 162* 207* 180*    INR RESULTS:   Lab Results  Component Value Date   INR 1.06 01/15/2013   INR 0.96 08/14/2011   INR 1.0 08/18/2007     Studies/Results: Dg Ercp Biliary & Pancreatic Ducts  11/10/2013   CLINICAL DATA:  Pancreatitis and biliary dilatation.  EXAM: ERCP  TECHNIQUE: Multiple spot images obtained  with the fluoroscopic device and submitted for interpretation post-procedure.  COMPARISON:  11/02/2013  FINDINGS: Images obtained during ERCP show intrahepatic bile duct dilatation. The common bile duct is mildly increased in caliber. Subsequent images show placement of a biliary stent with decompression of the intrahepatic bile ducts.  IMPRESSION: 1. Status post common bile duct stent placement with decompression of dilated intrahepatic bile ducts.  These images were submitted for radiologic interpretation only. Please see the procedural report for the amount of contrast and the fluoroscopy time utilized.   Electronically Signed   By: Signa Kellaylor  Stroud M.D.   On: 11/10/2013 14:34    Medications: I have reviewed the patient's current medications.  Assessment/Plan: #1 Pancreatitis and jaundice: improved after biliary stent placement, will recheck CMET tomorrow and lipase and amylase #2 Dyspnea: likely due to development of pleural effusions from hypoalbuminemia. Will check CXR and initiate diuretic rx, check BNP in the AM   LOS: 10 days   Jarome MatinDaniel Kabir Brannock 11/11/2013, 6:44 AM

## 2013-11-11 NOTE — Progress Notes (Signed)
PARENTERAL NUTRITION CONSULT NOTE   Pharmacy Consult for TPN Indication: Biliary obstruction 2/2 pancreatitis, failed enteral feeding  No Known Allergies  Patient Measurements: Height: 5' 8"  (172.7 cm) Weight: 180 lb (81.647 kg) IBW/kg (Calculated) : 68.4 Adjusted Body Weight:  Usual Weight:  Pt thinks he's lost 12 pounds in the past 3 weeks   Vital Signs: Temp: 97.3 F (36.3 C) (05/14 0408) Temp src: Oral (05/14 0408) BP: 140/64 mmHg (05/14 0408) Pulse Rate: 67 (05/14 0408) Intake/Output from previous day: 05/13 0701 - 05/14 0700 In: 1531.8 [I.V.:891.8; TPN:640] Out: 500 [Urine:500] Intake/Output from this shift:    Labs:  Recent Labs  11/09/13 0535  WBC 4.7  HGB 8.9*  HCT 25.7*  PLT 139*     Recent Labs  11/09/13 0535 11/10/13 0500 11/11/13 0030 11/11/13 0454  NA 149* 143 142 142  K 3.8 4.0 4.1 3.9  CL 117* 111 109 110  CO2 23 23 24 23   GLUCOSE 127* 124* 245* 182*  BUN 30* 29* 30* 32*  CREATININE 1.33 1.28 1.22 1.25  CALCIUM 8.7 8.5 8.3* 8.5  MG 1.9  --   --  2.1  PHOS 1.8* 2.5  --  2.1*  PROT 5.0* 5.0* 5.2* 5.0*  ALBUMIN 1.8* 1.8* 1.6* 1.7*  AST 158* 173* 137* 120*  ALT 66* 72* 66* 63*  ALKPHOS 1404* 1404* 1188* 1121*  BILITOT 11.5* 12.1* 9.3* 7.3*  PREALBUMIN 14.2*  --   --   --   TRIG 160*  --   --   --    Estimated Creatinine Clearance: 40.3 ml/min (by C-G formula based on Cr of 1.25).    Recent Labs  11/10/13 2009 11/11/13 0025 11/11/13 0339  GLUCAP 162* 207* 180*    Medical History: Past Medical History  Diagnosis Date  . Osteoarthritis   . Gout   . Hypertension   . History of coronary artery disease   . GERD (gastroesophageal reflux disease)   . CHF (congestive heart failure)   . Diastolic dysfunction   . History of GI bleed   . History of pacemaker     Medtronic Adapta ADDro1  . Peptic ulcer disease   . BPH (benign prostatic hypertrophy)   . 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 Hyperlipidemia   .  History of cerebrovascular disease   . Kidney stone   . Depression   . Chest pain   . Pacemaker    Insulin Requirements in the past 24 hours:  9 units insulin on sensitive SSI, no hx DM  Nutritional Goals:  RD recs: 1750-1950 KCal/day, protein 105g/day, fluid 1.7-1.9L/day   Clinimix E5/15 at a goal rate of 17m/hr + IVFE 20% at 137mhr to provide: 100g/day protein, 1894Kcal/day, ~2L fluid/day avg.  Current Nutrition: NPO for ERCP, tolerated some CLD 5/12  [id number]5 with abdominal pain, nausea, and vomiting with recent pancreatitis found to have acute worsening of pancreatitis with elevated LFTs and lipase possibly related to passed gallstones and biliary obstruction.  Multiple unsuccessful attempts at placing feeding tubes, therefore TNA started on 5/11. Risk for re-feeding syndrome as pt was not eating for 1 week PTA and has not been able to receive consistent nutrition inpatient.  Proceeded with ERCP with stent 5/13.    TNA access: PICC placed 11/08/13  5/14: D4 TNA  Glucose - has some elevated CBGs yesterday following procedure.  Received 10 mg of dexamethasone yesterday.  CBGs  previously controlled.  Will watch CBGs today to see if improves before adjusting SSI.  Electrolytes - Phos 2.1, was WNL yesterday following replacement.  Will replace again today.  Other lytes ok.  LFTs - AST/ALT, Alk Phos decreasing since procedure yesterday  Renal: AoCKD - SCr much improved since admission and may be at baseline now at 1.25.  BUN 32.  TGs - 160 (5/12), slightly elevated  Prealbumin - 14.2 (5/12)  Plan:  At 1800 today:  Continue Clinimix E 5/15 at goal at 71m/hr with fat emulsion at 187mhr.  TNA to contain standard multivitamins daily.  Removing trace elements for Tbili>6.0, adding Zinc, Chromium, Selenium daily.  KPhos 20 mmol IV x 1.  Red[facility] to [facility] for volume provided by TNA.  Continue SSI sensitive q4h.    TNA lab  panels on Mondays & Thursdays.    F/u resumption and tolerance of diet.  AmHershal CoriaPharmD, BCPS Pager: [phone] 2015/14/2015 7:05 AM

## 2013-11-12 LAB — COMPREHENSIVE METABOLIC PANEL
ALT: 54 U/L — ABNORMAL HIGH (ref 0–53)
ALT: 57 U/L — ABNORMAL HIGH (ref 0–53)
AST: 92 U/L — AB (ref 0–37)
AST: 80 U/L — AB (ref 0–37)
Albumin: 1.9 g/dL — ABNORMAL LOW (ref 3.5–5.2)
Albumin: 1.9 g/dL — ABNORMAL LOW (ref 3.5–5.2)
Alkaline Phosphatase: 1063 U/L — ABNORMAL HIGH (ref 39–117)
Alkaline Phosphatase: 1115 U/L — ABNORMAL HIGH (ref 39–117)
BUN: 36 mg/dL — ABNORMAL HIGH (ref 6–23)
BUN: 38 mg/dL — ABNORMAL HIGH (ref 6–23)
CALCIUM: 8.4 mg/dL (ref 8.4–10.5)
CALCIUM: 8.5 mg/dL (ref 8.4–10.5)
CO2: 24 meq/L (ref 19–32)
CO2: 24 meq/L (ref 19–32)
CREATININE: 1.25 mg/dL (ref 0.50–1.35)
CREATININE: 1.35 mg/dL (ref 0.50–1.35)
Chloride: 104 mEq/L (ref 96–112)
Chloride: 104 mEq/L (ref 96–112)
GFR calc Af Amer: 53 mL/min — ABNORMAL LOW (ref >90)
GFR calc non Af Amer: 46 mL/min — ABNORMAL LOW (ref >90)
GFR calc non Af Amer: 50 mL/min — ABNORMAL LOW (ref >90)
GFR, EST AFRICAN AMERICAN: 58 mL/min — AB (ref >90)
GLUCOSE: 134 mg/dL — AB (ref 70–99)
Glucose, Bld: 148 mg/dL — ABNORMAL HIGH (ref 70–99)
Potassium: 3.8 mEq/L (ref 3.7–5.3)
Potassium: 3.9 mEq/L (ref 3.7–5.3)
SODIUM: 137 meq/L (ref 137–147)
Sodium: 138 mEq/L (ref 137–147)
TOTAL PROTEIN: 5.5 g/dL — AB (ref 6.0–8.3)
TOTAL PROTEIN: 5.3 g/dL — AB (ref 6.0–8.3)
Total Bilirubin: 5.7 mg/dL — ABNORMAL HIGH (ref 0.3–1.2)
Total Bilirubin: 6.1 mg/dL — ABNORMAL HIGH (ref 0.3–1.2)

## 2013-11-12 LAB — GLUCOSE, CAPILLARY
GLUCOSE-CAPILLARY: 107 mg/dL — AB (ref 70–99)
GLUCOSE-CAPILLARY: 136 mg/dL — AB (ref 70–99)
Glucose-Capillary: 145 mg/dL — ABNORMAL HIGH (ref 70–99)
Glucose-Capillary: 146 mg/dL — ABNORMAL HIGH (ref 70–99)
Glucose-Capillary: 115 mg/dL — ABNORMAL HIGH (ref 70–99)

## 2013-11-12 LAB — AMYLASE: AMYLASE: 303 U/L — AB (ref 0–105)

## 2013-11-12 LAB — PRO B NATRIURETIC PEPTIDE: Pro B Natriuretic peptide (BNP): 1954 pg/mL — ABNORMAL HIGH (ref 0–450)

## 2013-11-12 LAB — PHOSPHORUS: Phosphorus: 3.3 mg/dL (ref 2.3–4.6)

## 2013-11-12 LAB — LIPASE, BLOOD: Lipase: 930 U/L — ABNORMAL HIGH (ref 11–59)

## 2013-11-12 MED ORDER — FUROSEMIDE 40 MG PO TABS: 40.0000 mg | ORAL_TABLET | Freq: Every day | ORAL | Status: DC

## 2013-11-12 MED ORDER — TRACE MINERALS CR-CU-F-FE-I-MN-MO-SE-ZN IV SOLN
INTRAVENOUS | Status: AC
  Administered 2013-11-12: 18:00:00 via INTRAVENOUS
  Filled 2013-11-12: qty 2000

## 2013-11-12 MED ORDER — METOCLOPRAMIDE HCL 5 MG PO TABS
5.0000 mg | ORAL_TABLET | Freq: Three times a day (TID) | ORAL | Status: DC
  Administered 2013-11-12 – 2013-11-17 (×16): 5 mg via ORAL
  Filled 2013-11-12 (×19): qty 1

## 2013-11-12 MED ORDER — FAT EMULSION 20 % IV EMUL
240.0000 mL | INTRAVENOUS | Status: AC
  Administered 2013-11-12: 240 mL via INTRAVENOUS
  Filled 2013-11-12: qty 250

## 2013-11-12 NOTE — Progress Notes (Signed)
PT Cancellation Note  Patient Details Name: Pietro Cassishomas E Mccoin MRN: [id number] DOB: [birth year]   Cancelled Treatment:    Reason Eval/Treat Not Completed: Other--pt politely declined and requested PT check back another day-not feeling well enough to work with PT today.    Rebeca AlertJannie Abdalrahman Clementson, MPT Pager: [phone]

## 2013-11-12 NOTE — Progress Notes (Signed)
PARENTERAL NUTRITION CONSULT NOTE   Pharmacy Consult for TPN Indication: Biliary obstruction 2/2 pancreatitis, failed enteral feeding  No Known Allergies  Patient Measurements: Height: 5' 8"  (172.7 cm) Weight: 180 lb (81.647 kg) IBW/kg (Calculated) : 68.4 Adjusted Body Weight:  Usual Weight:  Pt thinks he's lost 12 pounds in the past 3 weeks   Vital Signs: Temp: 97.7 F (36.5 C) (05/15 0345) Temp src: Oral (05/15 0345) BP: 155/68 mmHg (05/15 0345) Pulse Rate: 84 (05/15 0345) Intake/Output from previous day: 05/14 0701 - 05/15 0700 In: 2533 [P.O.:960; TPN:1573] Out: -  Intake/Output from this shift:    Labs: No results found for this basename: WBC, HGB, HCT, PLT, APTT, INR,  in the last 72 hours   Recent Labs  11/10/13 0500  11/11/13 0454 11/12/13 0010 11/12/13 0525  NA 143  < > 142 138 137  K 4.0  < > 3.9 3.9 3.8  CL 111  < > 110 104 104  CO2 23  < > 23 24 24   GLUCOSE 124*  < > 182* 134* 148*  BUN 29*  < > 32* 36* 38*  CREATININE 1.28  < > 1.25 1.25 1.35  CALCIUM 8.5  < > 8.5 8.4 8.5  MG  --   --  2.1  --   --   PHOS 2.5  --  2.1*  --   --   PROT 5.0*  < > 5.0* 5.5* 5.3*  ALBUMIN 1.8*  < > 1.7* 1.9* 1.9*  AST 173*  < > 120* 92* 80*  ALT 72*  < > 63* 57* 54*  ALKPHOS 1404*  < > 1121* 1115* 1063*  BILITOT 12.1*  < > 7.3* 6.1* 5.7*  < > = values in this interval not displayed. Estimated Creatinine Clearance: 37.3 ml/min (by C-G formula based on Cr of 1.35).    Recent Labs  11/11/13 1959 11/11/13 2349 11/12/13 0347  GLUCAP 144* 116* 107*    Medical History: Past Medical History  Diagnosis Date  . Osteoarthritis   . Gout   . Hypertension   . History of coronary artery disease   . GERD (gastroesophageal reflux disease)   . CHF (congestive heart failure)   . Diastolic dysfunction   . History of GI bleed   . History of pacemaker     Medtronic Adapta ADDro1  . Peptic ulcer disease   . BPH (benign prostatic hypertrophy)   . 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 Hyperlipidemia   . History of cerebrovascular disease   . Kidney stone   . Depression   . Chest pain   . Pacemaker    Insulin Requirements in the past 24 hours:  5 units insulin on sensitive SSI, no hx DM  Nutritional Goals:  RD recs: 1750-1950 KCal/day, protein 105g/day, fluid 1.7-1.9L/day   Clinimix E5/15 at a goal rate of 61m/hr + IVFE 20% at 139mhr to provide: 100g/day protein, 1894Kcal/day, ~2L fluid/day avg.  Current Nutrition: CLD  [facility] [age][facility]5 with abdominal pain, nausea, and vomiting with recent pancreatitis found to have acute worsening of pancreatitis with elevated LFTs and lipase possibly related to passed gallstones and biliary obstruction.  Multiple unsuccessful attempts at placing feeding tubes, therefore TNA started on 5/11. Risk for re-feeding syndrome as pt was not eating for 1 week PTA and has not been able to receive consistent nutrition inpatient.  Proceeded with ERCP with stent 5/13.  TNA access: PICC placed 11/08/13  5/15: D5 TNA  Glucose - controlled.  Were slightly elevated post-procedure but improved.  Electrolytes - Phos WNL following replacement.  Other lytes ok.  LFTs - AST/ALT, Alk Phos decreasing since procedure 5/13  Renal: AoCKD - SCr much improved since admission and near baseline of ~1.5.  BUN 38.  TGs - 160 (5/12), slightly elevated  Prealbumin - 14.2 (5/12)  Tolerating some CLD, pt eager to advance diet.  Plan:  At 1800 today:  Continue Clinimix E 5/15 at goal at 75m/hr with fat emulsion at 153mhr.  TNA to contain standard multivitamins daily.  Since Tbili<6, will add back trace elements but only 1/2 since patient still jaundiced.  [facility] by [facility].  Continue SSI sensitive q4h.    TNA lab panels on Mondays & Thursdays.  CMET ordered daily by MD.  Repeat Mag/Phos in AM as well.  F/u advancement and tolerance of diet.  AmHershal CoriaPharmD, BCPS Pager: [phone]/15/2015 7:33 AM

## 2013-11-12 NOTE — Progress Notes (Signed)
[facility] Progress Note  Subjective: Patient slightly dyspneic no abdominal pain or itching  Objective: Vital signs in last 24 hours: Temp:  [97.7 F (36.5 C)-98.1 F (36.7 C)] 97.7 F (36.5 C) (05/15 0345) Pulse Rate:  [72-84] 84 (05/15 0345) Resp:  [18-20] 20 (05/15 0345) BP: (142-155)/(58-70) 155/68 mmHg (05/15 0345) SpO2:  [95 %-100 %] 98 % (05/15 0345) Weight change:    PE: Appears comfortable at rest, no jaundice  Lab Results: Results for orders placed during the hospital encounter of 11/01/13 (from the past 24 hour(s))  GLUCOSE, CAPILLARY     Status: Abnormal   Collection Time    11/11/13  7:33 AM      Result Value Ref Range   Glucose-Capillary 152 (*) 70 - 99 mg/dL  GLUCOSE, CAPILLARY     Status: Abnormal   Collection Time    11/11/13  4:00 PM      Result Value Ref Range   Glucose-Capillary 140 (*) 70 - 99 mg/dL  GLUCOSE, CAPILLARY     Status: Abnormal   Collection Time    11/11/13  7:59 PM      Result Value Ref Range   Glucose-Capillary 144 (*) 70 - 99 mg/dL  GLUCOSE, CAPILLARY     Status: Abnormal   Collection Time    11/11/13 11:49 PM      Result Value Ref Range   Glucose-Capillary 116 (*) 70 - 99 mg/dL  COMPREHENSIVE METABOLIC PANEL     Status: Abnormal   Collection Time    11/12/13 12:10 AM      Result Value Ref Range   Sodium 138  137 - 147 mEq/L   Potassium 3.9  3.7 - 5.3 mEq/L   Chloride 104  96 - 112 mEq/L   CO2 24  19 - 32 mEq/L   Glucose, Bld 134 (*) 70 - 99 mg/dL   BUN 36 (*) 6 - 23 mg/dL   Creatinine, Ser 1.611.25  0.50 - 1.35 mg/dL   Calcium 8.4  8.4 - 09.610.5 mg/dL   Total Protein 5.5 (*) 6.0 - 8.3 g/dL   Albumin 1.9 (*) 3.5 - 5.2 g/dL   AST 92 (*) 0 - 37 U/L   ALT 57 (*) 0 - 53 U/L   Alkaline Phosphatase 1115 (*) 39 - 117 U/L   Total Bilirubin 6.1 (*) 0.3 - 1.2 mg/dL   GFR calc non Af Amer 50 (*) >90 mL/min   GFR calc Af Amer 58 (*) >90 mL/min  GLUCOSE, CAPILLARY     Status: Abnormal   Collection Time    11/12/13  3:47 AM       Result Value Ref Range   Glucose-Capillary 107 (*) 70 - 99 mg/dL  COMPREHENSIVE METABOLIC PANEL     Status: Abnormal   Collection Time    11/12/13  5:25 AM      Result Value Ref Range   Sodium 137  137 - 147 mEq/L   Potassium 3.8  3.7 - 5.3 mEq/L   Chloride 104  96 - 112 mEq/L   CO2 24  19 - 32 mEq/L   Glucose, Bld 148 (*) 70 - 99 mg/dL   BUN 38 (*) 6 - 23 mg/dL   Creatinine, Ser 0.451.35  0.50 - 1.35 mg/dL   Calcium 8.5  8.4 - 40.910.5 mg/dL   Total Protein 5.3 (*) 6.0 - 8.3 g/dL   Albumin 1.9 (*) 3.5 - 5.2 g/dL   AST 80 (*) 0 - 37 U/L  ALT 54 (*) 0 - 53 U/L   Alkaline Phosphatase 1063 (*) 39 - 117 U/L   Total Bilirubin 5.7 (*) 0.3 - 1.2 mg/dL   GFR calc non Af Amer 46 (*) >90 mL/min   GFR calc Af Amer 53 (*) >90 mL/min  PRO B NATRIURETIC PEPTIDE     Status: Abnormal   Collection Time    11/12/13  5:25 AM      Result Value Ref Range   Pro B Natriuretic peptide (BNP) 1954.0 (*) 0 - 450 pg/mL  LIPASE, BLOOD     Status: Abnormal   Collection Time    11/12/13  5:25 AM      Result Value Ref Range   Lipase 930 (*) 11 - 59 U/L  AMYLASE     Status: Abnormal   Collection Time    11/12/13  5:25 AM      Result Value Ref Range   Amylase 303 (*) 0 - 105 U/L    Studies/Results: Dg Chest 2 View  11/11/2013   CLINICAL DATA:  Dyspnea, possible pleural effusion  EXAM: CHEST  2 VIEW  COMPARISON:  11/08/2013  FINDINGS: Cardiomediastinal silhouette is stable. Dual lead cardiac pacemaker is unchanged in position. Small bilateral pleural effusion with basilar posterior atelectasis. No pulmonary edema. Right PICC line with tip in SVC.  IMPRESSION: Small bilateral pleural effusion with basilar posterior atelectasis. Right PICC line in place. Dual lead cardiac pacemaker in place.   Electronically Signed   By: Natasha MeadLiviu  Pop M.D.   On: 11/11/2013 13:38   Ct Abdomen Pelvis W Contrast  11/11/2013   CLINICAL DATA:  Persistent jaundice following stent placement yesterday for CBD stricture. History of  pancreatitis.  EXAM: CT ABDOMEN AND PELVIS WITH CONTRAST  TECHNIQUE: Multidetector CT imaging of the abdomen and pelvis was performed using the standard protocol following bolus administration of intravenous contrast.  CONTRAST:  100mL OMNIPAQUE IOHEXOL 300 MG/ML  SOLN  COMPARISON:  ERCP 11/10/2013.  CT 11/02/2013.  FINDINGS: There is increased generalized soft tissue edema with diffuse subcutaneous edema, ill-defined fluid in both flanks, diffuse mesenteric edema and new moderate-sized pleural effusion laterally. There is dependent atelectasis at both lung bases. No significant pericardial effusion is present. The patient has a pacemaker.  Plastic biliary stent appears well positioned. There is improved biliary dilatation within the right hepatic lobe. However, there is persistent asymmetric biliary dilatation within the left lobe which remains mildly atrophied. No focal liver lesions are identified. However, there is mildly increased density throughout the fat in the porta hepatis.  There is stable ill-defined enlargement of the pancreatic head. There is no significant pancreatic ductal dilatation. The portal, superior mesenteric and splenic veins are patent. Mildly prominent peripancreatic lymph nodes are stable.  There is a stable 3.7 cm cyst in the interpolar region of the right kidney. Within the lower pole of the right kidney is a 2.2 cm lesion which measures 50 HU on image 51. On the noncontrast examination of last week, this measured 37 HU. Appearance remains concerning for an enhancing mass (renal cell carcinoma).  There is mild pelvic ascites. There is stable asymmetric fat within the right inguinal canal.  Small bladder diverticular are noted. The prostate gland is not enlarged.  Lower lumbar spondylosis appears stable. There are no worrisome osseous findings.  IMPRESSION: 1. Persistent intrahepatic biliary dilatation within the left hepatic lobe status post biliary stent placement. This could be  secondary to an intrahepatic biliary stricture or exclusion of the left biliary  system by the stent. 2. Anasarca with generalized soft tissue edema, bilateral pleural effusions and ascites. 3. Stable appearance of the pancreas consistent with pancreatitis. 4. Persistent concern of enhancing mass (renal cell carcinoma) involving the lower pole of the right kidney. Given the patient's pacemaker, MRI likely cannot be performed. Ultrasound appearance was inconclusive. At some point (after the current acute illness has resolved), dedicated renal CT to include pre and post-contrast imaging recommended.   Electronically Signed   By: Roxy Horseman M.D.   On: 11/11/2013 12:47   Dg Ercp Biliary & Pancreatic Ducts  11/10/2013   CLINICAL DATA:  Pancreatitis and biliary dilatation.  EXAM: ERCP  TECHNIQUE: Multiple spot images obtained with the fluoroscopic device and submitted for interpretation post-procedure.  COMPARISON:  11/02/2013  FINDINGS: Images obtained during ERCP show intrahepatic bile duct dilatation. The common bile duct is mildly increased in caliber. Subsequent images show placement of a biliary stent with decompression of the intrahepatic bile ducts.  IMPRESSION: 1. Status post common bile duct stent placement with decompression of dilated intrahepatic bile ducts.  These images were submitted for radiologic interpretation only. Please see the procedural report for the amount of contrast and the fluoroscopy time utilized.   Electronically Signed   By: Signa Kell M.D.   On: 11/10/2013 14:34      Assessment: 1. Pancreatitis, presumed biliary but with remote cholecystectomy and no stone seen on any study 2. 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 Possible persistent intrahepatic ductal dilatation not addressed by stent. 4. Anasarca pleural effusion and malnutrition  Plan: I would continue TPN and anticipate slow recovery from this  process. Continue to monitor liver function tests for decompression of bile duct stricture from stent. Given persistent elevations of lipase, diffuse pancreatitis and no definite stone seen, will obtain serum IgG 4 level  for marker for possible autoimmune pancreatitis which would appear unlikely. Clear liquid diet for now Probable followup CT scan in another week or so.    [name] [name] 11/12/2013, 7:27 AM

## 2013-11-12 NOTE — Progress Notes (Signed)
Subjective: Has abdominal bloating, dyspnea improved, having some BMs  Objective: Vital signs in last 24 hours: Temp:  [97.7 F (36.5 C)-98.1 F (36.7 C)] 97.7 F (36.5 C) (05/15 0345) Pulse Rate:  [72-84] 84 (05/15 0345) Resp:  [18-20] 20 (05/15 0345) BP: (142-155)/(58-70) 155/68 mmHg (05/15 0345) SpO2:  [95 %-100 %] 98 % (05/15 0345) Weight change:    Intake/Output from previous day: 05/14 0701 - 05/15 0700 In: 2533 [P.O.:960; TPN:1573] Out: -    General appearance: alert, cooperative and no distress Resp: clear to auscultation bilaterally Cardio: regular rate and rhythm and with a 2/6 SEM GI: mild distension, normal bowel sounds, diffuse tympany, nontender Extremities: bilateral 2+ edema of arms  Lab Results: No results found for this basename: WBC, HGB, HCT, PLT,  in the last 72 hours BMET  Recent Labs  11/12/13 0010 11/12/13 0525  NA 138 137  K 3.9 3.8  CL 104 104  CO2 24 24  GLUCOSE 134* 148*  BUN 36* 38*  CREATININE 1.25 1.35  CALCIUM 8.4 8.5   CMET CMP     Component Value Date/Time   NA 137 11/12/2013 0525   K 3.8 11/12/2013 0525   CL 104 11/12/2013 0525   CO2 24 11/12/2013 0525   GLUCOSE 148* 11/12/2013 0525   BUN 38* 11/12/2013 0525   CREATININE 1.35 11/12/2013 0525   CALCIUM 8.5 11/12/2013 0525   PROT 5.3* 11/12/2013 0525   ALBUMIN 1.9* 11/12/2013 0525   AST 80* 11/12/2013 0525   ALT 54* 11/12/2013 0525   ALKPHOS 1063* 11/12/2013 0525   BILITOT 5.7* 11/12/2013 0525   GFRNONAA 46* 11/12/2013 0525   GFRAA 53* 11/12/2013 0525    CBG (last 3)   Recent Labs  11/11/13 1959 11/11/13 2349 11/12/13 0347  GLUCAP 144* 116* 107*    INR RESULTS:   Lab Results  Component Value Date   INR 1.06 01/15/2013   INR 0.96 08/14/2011   INR 1.0 08/18/2007     Studies/Results: Dg Chest 2 View  11/11/2013   CLINICAL DATA:  Dyspnea, possible pleural effusion  EXAM: CHEST  2 VIEW  COMPARISON:  11/08/2013  FINDINGS: Cardiomediastinal silhouette is stable. Dual lead  cardiac pacemaker is unchanged in position. Small bilateral pleural effusion with basilar posterior atelectasis. No pulmonary edema. Right PICC line with tip in SVC.  IMPRESSION: Small bilateral pleural effusion with basilar posterior atelectasis. Right PICC line in place. Dual lead cardiac pacemaker in place.   Electronically Signed   By: Natasha MeadLiviu  Pop M.D.   On: 11/11/2013 13:38   Ct Abdomen Pelvis W Contrast  11/11/2013   CLINICAL DATA:  Persistent jaundice following stent placement yesterday for CBD stricture. History of pancreatitis.  EXAM: CT ABDOMEN AND PELVIS WITH CONTRAST  TECHNIQUE: Multidetector CT imaging of the abdomen and pelvis was performed using the standard protocol following bolus administration of intravenous contrast.  CONTRAST:  100mL OMNIPAQUE IOHEXOL 300 MG/ML  SOLN  COMPARISON:  ERCP 11/10/2013.  CT 11/02/2013.  FINDINGS: There is increased generalized soft tissue edema with diffuse subcutaneous edema, ill-defined fluid in both flanks, diffuse mesenteric edema and new moderate-sized pleural effusion laterally. There is dependent atelectasis at both lung bases. No significant pericardial effusion is present. The patient has a pacemaker.  Plastic biliary stent appears well positioned. There is improved biliary dilatation within the right hepatic lobe. However, there is persistent asymmetric biliary dilatation within the left lobe which remains mildly atrophied. No focal liver lesions are identified. However, there is mildly increased  density throughout the fat in the porta hepatis.  There is stable ill-defined enlargement of the pancreatic head. There is no significant pancreatic ductal dilatation. The portal, superior mesenteric and splenic veins are patent. Mildly prominent peripancreatic lymph nodes are stable.  There is a stable 3.7 cm cyst in the interpolar region of the right kidney. Within the lower pole of the right kidney is a 2.2 cm lesion which measures 50 HU on image 51. On the  noncontrast examination of last week, this measured 37 HU. Appearance remains concerning for an enhancing mass (renal cell carcinoma).  There is mild pelvic ascites. There is stable asymmetric fat within the right inguinal canal.  Small bladder diverticular are noted. The prostate gland is not enlarged.  Lower lumbar spondylosis appears stable. There are no worrisome osseous findings.  IMPRESSION: 1. Persistent intrahepatic biliary dilatation within the left hepatic lobe status post biliary stent placement. This could be secondary to an intrahepatic biliary stricture or exclusion of the left biliary system by the stent. 2. Anasarca with generalized soft tissue edema, bilateral pleural effusions and ascites. 3. Stable appearance of the pancreas consistent with pancreatitis. 4. Persistent concern of enhancing mass (renal cell carcinoma) involving the lower pole of the right kidney. Given the patient's pacemaker, MRI likely cannot be performed. Ultrasound appearance was inconclusive. At some point (after the current acute illness has resolved), dedicated renal CT to include pre and post-contrast imaging recommended.   Electronically Signed   By: Roxy HorsemanBill  Veazey M.D.   On: 11/11/2013 12:47   Dg Ercp Biliary & Pancreatic Ducts  11/10/2013   CLINICAL DATA:  Pancreatitis and biliary dilatation.  EXAM: ERCP  TECHNIQUE: Multiple spot images obtained with the fluoroscopic device and submitted for interpretation post-procedure.  COMPARISON:  11/02/2013  FINDINGS: Images obtained during ERCP show intrahepatic bile duct dilatation. The common bile duct is mildly increased in caliber. Subsequent images show placement of a biliary stent with decompression of the intrahepatic bile ducts.  IMPRESSION: 1. Status post common bile duct stent placement with decompression of dilated intrahepatic bile ducts.  These images were submitted for radiologic interpretation only. Please see the procedural report for the amount of contrast and the  fluoroscopy time utilized.   Electronically Signed   By: Signa Kellaylor  Stroud M.D.   On: 11/10/2013 14:34    Medications: I have reviewed the patient's current medications.  Assessment/Plan: #1 Pancreatitis: slowly improving #2 Protein calorie malnutrition: slowly improving with TPN #3 CHF: acute and from diastolic dysfunction, improved with lasix  #4 Right Renal Lesion: concerning and discussed with patient and daughter, will get CT kidneys at some point after he has recovered from current illness.   LOS: 11 days   Jarome MatinDaniel Lue Sykora 11/12/2013, 7:36 AM

## 2013-11-13 LAB — COMPREHENSIVE METABOLIC PANEL
ALK PHOS: 877 U/L — AB (ref 39–117)
ALT: 42 U/L (ref 0–53)
ALT: 43 U/L (ref 0–53)
AST: 55 U/L — ABNORMAL HIGH (ref 0–37)
AST: 58 U/L — AB (ref 0–37)
Albumin: 1.7 g/dL — ABNORMAL LOW (ref 3.5–5.2)
Albumin: 1.7 g/dL — ABNORMAL LOW (ref 3.5–5.2)
Alkaline Phosphatase: 879 U/L — ABNORMAL HIGH (ref 39–117)
BILIRUBIN TOTAL: 4.4 mg/dL — AB (ref 0.3–1.2)
BUN: 43 mg/dL — ABNORMAL HIGH (ref 6–23)
BUN: 44 mg/dL — AB (ref 6–23)
CALCIUM: 8 mg/dL — AB (ref 8.4–10.5)
CHLORIDE: 102 meq/L (ref 96–112)
CO2: 23 mEq/L (ref 19–32)
CO2: 22 meq/L (ref 19–32)
Calcium: 8.4 mg/dL (ref 8.4–10.5)
Chloride: 103 mEq/L (ref 96–112)
Creatinine, Ser: 1.4 mg/dL — ABNORMAL HIGH (ref 0.50–1.35)
Creatinine, Ser: 1.39 mg/dL — ABNORMAL HIGH (ref 0.50–1.35)
GFR calc Af Amer: 51 mL/min — ABNORMAL LOW (ref >90)
GFR calc non Af Amer: 44 mL/min — ABNORMAL LOW (ref >90)
GFR, EST AFRICAN AMERICAN: 51 mL/min — AB (ref >90)
GFR, EST NON AFRICAN AMERICAN: 44 mL/min — AB (ref >90)
GLUCOSE: 144 mg/dL — AB (ref 70–99)
Glucose, Bld: 143 mg/dL — ABNORMAL HIGH (ref 70–99)
POTASSIUM: 4 meq/L (ref 3.7–5.3)
POTASSIUM: 3.8 meq/L (ref 3.7–5.3)
SODIUM: 136 meq/L — AB (ref 137–147)
Sodium: 135 mEq/L — ABNORMAL LOW (ref 137–147)
TOTAL PROTEIN: 5.2 g/dL — AB (ref 6.0–8.3)
Total Bilirubin: 4.6 mg/dL — ABNORMAL HIGH (ref 0.3–1.2)
Total Protein: 5.1 g/dL — ABNORMAL LOW (ref 6.0–8.3)

## 2013-11-13 LAB — GLUCOSE, CAPILLARY
GLUCOSE-CAPILLARY: 137 mg/dL — AB (ref 70–99)
Glucose-Capillary: 133 mg/dL — ABNORMAL HIGH (ref 70–99)
Glucose-Capillary: 159 mg/dL — ABNORMAL HIGH (ref 70–99)
Glucose-Capillary: 125 mg/dL — ABNORMAL HIGH (ref 70–99)
Glucose-Capillary: 143 mg/dL — ABNORMAL HIGH (ref 70–99)
Glucose-Capillary: 147 mg/dL — ABNORMAL HIGH (ref 70–99)

## 2013-11-13 LAB — MAGNESIUM: Magnesium: 2 mg/dL (ref 1.5–2.5)

## 2013-11-13 LAB — PHOSPHORUS: PHOSPHORUS: 3.6 mg/dL (ref 2.3–4.6)

## 2013-11-13 MED ORDER — TRACE MINERALS CR-CU-F-FE-I-MN-MO-SE-ZN IV SOLN
INTRAVENOUS | Status: AC
  Administered 2013-11-13: 18:00:00 via INTRAVENOUS
  Filled 2013-11-13: qty 2000

## 2013-11-13 MED ORDER — FAT EMULSION 20 % IV EMUL
240.0000 mL | INTRAVENOUS | Status: AC
  Administered 2013-11-13: 240 mL via INTRAVENOUS
  Filled 2013-11-13: qty 250

## 2013-11-13 NOTE — Progress Notes (Signed)
Patient ID: [name] [name], male   DOB: 03/13/1924, 80 y.o.   MR[facility]: [id number] [facility]  [name] [name] 80 y.o. 03/13/1924   Subjective: Denies abdominal pain/N/V. Feels swollen. Tolerating clears.  Objective: Vital signs in last 24 hours: Filed Vitals:   11/13/13 0354  BP: 128/44  Pulse: 82  Temp: 98.2 F (36.8 C)  Resp: 21    Physical Exam: Gen: alert, no acute distress, jaundice, elderly Abd: distended, nontender, decreased bowel sounds  Lab Results:  Recent Labs  11/11/13 0454  11/12/13 0525 11/13/13 0020 11/13/13 0520  NA 142  < > 137 136* 135*  K 3.9  < > 3.8 4.0 3.8  CL 110  < > 104 103 102  CO2 23  < > 24 23 22   GLUCOSE 182*  < > 148* 143* 144*  BUN 32*  < > 38* 43* 44*  CREATININE 1.25  < > 1.35 1.40* 1.39*  CALCIUM 8.5  < > 8.5 8.0* 8.4  MG 2.1  --   --   --  2.0  PHOS 2.1*  --  3.3  --  3.6  < > = values in this interval not displayed.  Recent Labs  11/13/13 0020 11/13/13 0520  AST 58* 55*  ALT 43 42  ALKPHOS 879* 877*  BILITOT 4.6* 4.4*  PROT 5.2* 5.1*  ALBUMIN 1.7* 1.7*   No results found for this basename: WBC, NEUTROABS, HGB, HCT, MCV, PLT,  in the last 72 hours No results found for this basename: LABPROT, INR,  in the last 72 hours    Assessment/Plan: 80 yo with CBD stricture and biliary pancreatitis. S/P biliary stent. ALP and TB remains elevated. Continue supportive care. Continue clears. Will follow.   Shirley FriarVincent C. Manjot Hinks 11/13/2013, 10:28 AM

## 2013-11-13 NOTE — Progress Notes (Signed)
PARENTERAL NUTRITION CONSULT NOTE   Pharmacy Consult for TPN Indication: Biliary obstruction 2/2 pancreatitis, failed enteral feeding  No Known Allergies  Patient Measurements: Height: 5' 8"  (172.7 cm) Weight: 218 lb 4.8 oz (99.02 kg) IBW/kg (Calculated) : 68.4 Adjusted Body Weight: 77.3 kg Usual Weight:  Pt thinks he's lost 12 pounds in the past 3 weeks   Vital Signs: Temp: 98.2 F (36.8 C) (05/16 0354) Temp src: Oral (05/16 0354) BP: 128/44 mmHg (05/16 0354) Pulse Rate: 82 (05/16 0354) Intake/Output from previous day: 05/15 0701 - 05/16 0700 In: 720 [P.O.:720] Out: 475 [Urine:475] Intake/Output from this shift:    Labs: No results found for this basename: WBC, HGB, HCT, PLT, APTT, INR,  in the last 72 hours   Recent Labs  11/11/13 0454  11/12/13 0525 11/13/13 0020 11/13/13 0520  NA 142  < > 137 136* 135*  K 3.9  < > 3.8 4.0 3.8  CL 110  < > 104 103 102  CO2 23  < > 24 23 22   GLUCOSE 182*  < > 148* 143* 144*  BUN 32*  < > 38* 43* 44*  CREATININE 1.25  < > 1.35 1.40* 1.39*  CALCIUM 8.5  < > 8.5 8.0* 8.4  MG 2.1  --   --   --  2.0  PHOS 2.1*  --  3.3  --  3.6  PROT 5.0*  < > 5.3* 5.2* 5.1*  ALBUMIN 1.7*  < > 1.9* 1.7* 1.7*  AST 120*  < > 80* 58* 55*  ALT 63*  < > 54* 43 42  ALKPHOS 1121*  < > 1063* 879* 877*  BILITOT 7.3*  < > 5.7* 4.6* 4.4*  < > = values in this interval not displayed. Estimated Creatinine Clearance: 42.7 ml/min (by C-G formula based on Cr of 1.39).    Recent Labs  11/12/13 2348 11/13/13 0352 11/13/13 0752  GLUCAP 137* 133* 159*    Medical History: Past Medical History  Diagnosis Date  . Osteoarthritis   . Gout   . Hypertension   . History of coronary artery disease   . GERD (gastroesophageal reflux disease)   . CHF (congestive heart failure)   . Diastolic dysfunction   . History of GI bleed   . History of pacemaker     Medtronic Adapta ADDro1  . Peptic ulcer disease   . BPH (benign prostatic hypertrophy)   . Macular  degeneration, right eye     Near blind  . Hyperlipidemia   . History of cerebrovascular disease   . Kidney stone   . Depression   . Chest pain   . Pacemaker    Insulin Requirements in the past 24 hours:  5 units insulin on sensitive SSI, no hx DM  Nutritional Goals:  RD recs: 1750-1950 KCal/day, protein 105g/day, fluid 1.7-1.9L/day   Clinimix E5/15 at a goal rate of 13m/hr + IVFE 20% at 1101mhr to provide: 100g/day protein, 1894Kcal/day, ~2L fluid/day avg.  Current Nutrition: CLD  [facility] [age][facility] admitted 5/5 with abdominal pain, nausea, and vomiting with recent pancreatitis found to have acute worsening of pancreatitis with elevated LFTs and lipase possibly related to passed gallstones and biliary obstruction.  Multiple unsuccessful attempts at placing feeding tubes, therefore TNA started on 5/11. Risk for re-feeding syndrome as pt was not eating for 1 week PTA and has not been able to receive consistent nutrition inpatient.  Proceeded with ERCP with stent 5/13.    TNA  access: PICC placed 11/08/13  5/16: D6 TNA   Glucose - near goal of <150. Were slightly elevated post-procedure but improved, now trending back up on clear liquids.  Electrolytes - Phos WNL following replacement. Na low/normal. Other lytes ok.  LFTs - AST/ALT, Alk Phos decreasing since procedure 5/13  Renal: AoCKD - SCr much improved since admission and near baseline of ~1.5.  BUN 44 and trending up.  TGs - 160 (5/12), slightly elevated  Prealbumin - 14.2 (5/12)  Tolerating some CLD, +BMs, pt eager to advance diet.  Plan:   Continue Clinimix E 5/15 at goal at 45m/hr with fat emulsion at 152mhr.  TNA to contain standard multivitamins daily.  Since Tbili<6, trace elements added back 5/15 but only 1/2 since patient still jaundiced.  [facility].  Continue SSI sensitive q4h.    TNA lab panels on Mondays & Thursdays.  CMET ordered daily by  MD.  F/u advancement and tolerance of diet.  ErPeggyann JubaPharmD, BCPS Pager: [phone]/16/2015 7:58 AM

## 2013-11-13 NOTE — Progress Notes (Signed)
Subjective: F/Up Recurrent Pancreatitis. S/P Biliary Drain. Has abdominal bloating, dyspnea improved, having some BMs Wearing FIO2. He reports less edema. Tolerating Clears.  Objective: Vital signs in last 24 hours: Temp:  [98.1 F (36.7 C)-98.2 F (36.8 C)] 98.2 F (36.8 C) (05/16 0354) Pulse Rate:  [76-85] 82 (05/16 0354) Resp:  [21-22] 21 (05/16 0354) BP: (128-163)/(38-63) 128/44 mmHg (05/16 0354) SpO2:  [98 %-100 %] 100 % (05/16 0354) Weight:  [98.657 kg (217 lb 8 oz)-99.02 kg (218 lb 4.8 oz)] 99.02 kg (218 lb 4.8 oz) (05/16 0504) Weight change:    Intake/Output from previous day: 05/15 0701 - 05/16 0700 In: 720 [P.O.:720] Out: 475 [Urine:475]  PE  General appearance: alert, cooperative and no distress  Wearing O2. L cheek bandaged Resp: clear to auscultation bilaterally  Cardio: regular rate and rhythm and with a 2/6 SEM  GI: mild distension, normal bowel sounds, diffuse tympany, nontender  Extremities: bilateral 1+ edema of arms      Lab Results: No results found for this basename: WBC, HGB, HCT, PLT,  in the last 72 hours BMET  Recent Labs  11/13/13 0020 11/13/13 0520  NA 136* 135*  K 4.0 3.8  CL 103 102  CO2 23 22  GLUCOSE 143* 144*  BUN 43* 44*  CREATININE 1.40* 1.39*  CALCIUM 8.0* 8.4   CMET CMP     Component Value Date/Time   NA 135* 11/13/2013 0520   K 3.8 11/13/2013 0520   CL 102 11/13/2013 0520   CO2 22 11/13/2013 0520   GLUCOSE 144* 11/13/2013 0520   BUN 44* 11/13/2013 0520   CREATININE 1.39* 11/13/2013 0520   CALCIUM 8.4 11/13/2013 0520   PROT 5.1* 11/13/2013 0520   ALBUMIN 1.7* 11/13/2013 0520   AST 55* 11/13/2013 0520   ALT 42 11/13/2013 0520   ALKPHOS 877* 11/13/2013 0520   BILITOT 4.4* 11/13/2013 0520   GFRNONAA 44* 11/13/2013 0520   GFRAA 51* 11/13/2013 0520    CBG (last 3)   Recent Labs  11/12/13 2348 11/13/13 0352 11/13/13 0752  GLUCAP 137* 133* 159*    INR RESULTS:   Lab Results  Component Value Date   INR 1.06  01/15/2013   INR 0.96 08/14/2011   INR 1.0 08/18/2007     Studies/Results: Dg Chest 2 View  11/11/2013   CLINICAL DATA:  Dyspnea, possible pleural effusion  EXAM: CHEST  2 VIEW  COMPARISON:  11/08/2013  FINDINGS: Cardiomediastinal silhouette is stable. Dual lead cardiac pacemaker is unchanged in position. Small bilateral pleural effusion with basilar posterior atelectasis. No pulmonary edema. Right PICC line with tip in SVC.  IMPRESSION: Small bilateral pleural effusion with basilar posterior atelectasis. Right PICC line in place. Dual lead cardiac pacemaker in place.   Electronically Signed   By: Natasha MeadLiviu  Pop M.D.   On: 11/11/2013 13:38   Ct Abdomen Pelvis W Contrast  11/11/2013   CLINICAL DATA:  Persistent jaundice following stent placement yesterday for CBD stricture. History of pancreatitis.  EXAM: CT ABDOMEN AND PELVIS WITH CONTRAST  TECHNIQUE: Multidetector CT imaging of the abdomen and pelvis was performed using the standard protocol following bolus administration of intravenous contrast.  CONTRAST:  100mL OMNIPAQUE IOHEXOL 300 MG/ML  SOLN  COMPARISON:  ERCP 11/10/2013.  CT 11/02/2013.  FINDINGS: There is increased generalized soft tissue edema with diffuse subcutaneous edema, ill-defined fluid in both flanks, diffuse mesenteric edema and new moderate-sized pleural effusion laterally. There is dependent atelectasis at both lung bases. No significant pericardial effusion is present.  The patient has a pacemaker.  Plastic biliary stent appears well positioned. There is improved biliary dilatation within the right hepatic lobe. However, there is persistent asymmetric biliary dilatation within the left lobe which remains mildly atrophied. No focal liver lesions are identified. However, there is mildly increased density throughout the fat in the porta hepatis.  There is stable ill-defined enlargement of the pancreatic head. There is no significant pancreatic ductal dilatation. The portal, superior mesenteric  and splenic veins are patent. Mildly prominent peripancreatic lymph nodes are stable.  There is a stable 3.7 cm cyst in the interpolar region of the right kidney. Within the lower pole of the right kidney is a 2.2 cm lesion which measures 50 HU on image 51. On the noncontrast examination of last week, this measured 37 HU. Appearance remains concerning for an enhancing mass (renal cell carcinoma).  There is mild pelvic ascites. There is stable asymmetric fat within the right inguinal canal.  Small bladder diverticular are noted. The prostate gland is not enlarged.  Lower lumbar spondylosis appears stable. There are no worrisome osseous findings.  IMPRESSION: 1. Persistent intrahepatic biliary dilatation within the left hepatic lobe status post biliary stent placement. This could be secondary to an intrahepatic biliary stricture or exclusion of the left biliary system by the stent. 2. Anasarca with generalized soft tissue edema, bilateral pleural effusions and ascites. 3. Stable appearance of the pancreas consistent with pancreatitis. 4. Persistent concern of enhancing mass (renal cell carcinoma) involving the lower pole of the right kidney. Given the patient's pacemaker, MRI likely cannot be performed. Ultrasound appearance was inconclusive. At some point (after the current acute illness has resolved), dedicated renal CT to include pre and post-contrast imaging recommended.   Electronically Signed   By: Roxy HorsemanBill  Veazey M.D.   On: 11/11/2013 12:47    Medications: I have reviewed the patient's current medications.  Assessment/Plan: #1 Pancreatitis from CBD Stricture S/P Biliary stent: slowly improving on TNA/Clears (tolerating s pain) #2 Protein calorie malnutrition: slowly improving with TPN. CBGs fine #3 CHF/Anasarca: acute and from diastolic dysfunction, Prot Cal Malnutrition, Vol Overload, improving c with lasix.  Continue on 40 mg per day.  Wean O2 as able. #4 Right Renal Lesion: concerning for RCC, will get  CT kidneys at some point after he has recovered from current illness.  #5 CKD - Baseline Cr 1.5.  Current Cr @ 1.4 #6 HTN #7 DVT Proph #8 Known CAD #9 Dispo - Cont PT/OT and expect D/C Mon/Tuesday to home c daughter or SNF depending on needs   LOS: 12 days   Gwen PoundsJohn M Daenerys Buttram 11/13/2013, 8:31 AM

## 2013-11-13 NOTE — Evaluation (Signed)
Occupational Therapy Evaluation Patient Details Name: [name] [name] MRN: [id number] DOB: [birth year] Today's Date: 11/13/2013    History of Present Illness Pt is an 80 year old male admitted 5/4 for biliary obstruction related to pancreatitis and PHMx of pacemaker, CAD, HTN, gout, CHF, OA.  Pt s/p ERCP 5/13   Clinical Impression   Pt demonstrates decline in function and safety with ADLs and ADL mobility with decreased strength, balance and endurance. Pt would benefit from acute OT services to address impairments to increase level of function and safety    Follow Up Recommendations  SNF;Supervision/Assistance - 24 hour    Equipment Recommendations  None recommended by OT;Other (comment) (TBD)    Recommendations for Other Services       Precautions / Restrictions Precautions Precautions: Fall Restrictions Weight Bearing Restrictions: No      Mobility Bed Mobility Overal bed mobility: Needs Assistance Bed Mobility: Sit to Supine;Supine to Sit     Supine to sit: Supervision Sit to supine: Min assist   General bed mobility comments: required assist for LEs back onto bed  Transfers Overall transfer level: Needs assistance Equipment used: None Transfers: Sit to/from Stand Sit to Stand: Min assist         General transfer comment: cues for safety    Balance Overall balance assessment: Needs assistance Sitting-balance support: No upper extremity supported;Feet supported Sitting balance-Leahy Scale: Good     Standing balance support: Single extremity supported;Bilateral upper extremity supported;During functional activity Standing balance-Leahy Scale: Fair                              ADL Overall ADL's : Needs assistance/impaired     Grooming: Wash/dry hands;Wash/dry face;Minimal assistance;Standing   Upper Body Bathing: Set up;Supervision/ safety;Sitting   Lower Body Bathing: Moderate assistance;Maximal assistance;Sit to/from  stand;Sitting/lateral leans   Upper Body Dressing : Set up;Supervision/safety;Sitting   Lower Body Dressing: Maximal assistance;Sitting/lateral leans;Sit to/from stand;Total assistance   Toilet Transfer: Minimal assistance;BSC;Stand-pivot Toilet Transfer Details (indicate cue type and reason): cues for safety Toileting- Clothing Manipulation and Hygiene: Sit to/from stand;Sitting/lateral lean;Moderate assistance       Functional mobility during ADLs: Minimal assistance       Vision  wears reading glasses                              Pertinent Vitals/Pain No c/o pain, VSS     Hand Dominance Right   Extremity/Trunk Assessment Upper Extremity Assessment Upper Extremity Assessment: Generalized weakness   Lower Extremity Assessment Lower Extremity Assessment: Defer to PT evaluation       Communication Communication Communication: No difficulties   Cognition Arousal/Alertness: Awake/alert Behavior During Therapy: WFL for tasks assessed/performed Overall Cognitive Status: Within Functional Limits for tasks assessed                     General Comments   Pt pleasant and cooperative                 Home Living Family/patient expects to be discharged to:: Private residence or SNF Living Arrangements: Alone Available Help at Discharge: Family;Available PRN/intermittently Type of Home: House Home Access:  (has a chair lift)     Home Layout: One level     Bathroom Shower/Tub: Chief Strategy OfficerTub/shower unit   Bathroom Toilet: Handicapped height     Home Equipment: Environmental consultantWalker - 2 wheels;Cane - single point;Tub  bench          Prior Functioning/Environment Level of Independence: Independent             OT Diagnosis: Generalized weakness   OT Problem List: Decreased strength;Decreased knowledge of use of DME or AE;Decreased activity tolerance;Impaired balance (sitting and/or standing);Decreased safety awareness;Cardiopulmonary status limiting activity    OT Treatment/Interventions: Self-care/ADL training;Therapeutic exercise;Patient/family education;Neuromuscular education;Balance training;Therapeutic activities;DME and/or AE instruction    OT Goals(Current goals can be found in the care plan section) Acute Rehab OT Goals Patient Stated Goal: "to go to rehab and get better if i'm not able to go home yet" OT Goal Formulation: With patient Time For Goal Achievement: 11/20/13 Potential to Achieve Goals: Good ADL Goals Pt Will Perform Grooming: with min guard assist;with supervision;standing Pt Will Perform Upper Body Bathing: with set-up;with modified independence;sitting Pt Will Perform Lower Body Bathing: with mod assist;with min assist;sitting/lateral leans;sit to/from stand Pt Will Perform Upper Body Dressing: with set-up;with modified independence;sitting Pt Will Transfer to Toilet: with min guard assist;bedside commode;ambulating;regular height toilet;grab bars Pt Will Perform Toileting - Clothing Manipulation and hygiene: with min assist;sitting/lateral leans;sit to/from stand  OT Frequency: Min 2X/week   Barriers to D/C: Decreased caregiver support                        End of Session Equipment Utilized During Treatment: Gait belt;Oxygen;Other (comment) (BSC)  Activity Tolerance: Patient limited by fatigue Patient left: in bed;with call bell/phone within reach;with nursing/sitter in room   Time: 1150-1211 OT Time Calculation (min): 21 min Charges:  OT General Charges $OT Visit: 1 Procedure OT Evaluation $Initial OT Evaluation Tier I: 1 Procedure OT Treatments $Therapeutic Activity: 8-22 mins G-Codes:    Lafe GarinDenise J Nerida Boivin 11/13/2013, 1:26 PM

## 2013-11-14 ENCOUNTER — Inpatient Hospital Stay (HOSPITAL_COMMUNITY): Payer: Medicare Other

## 2013-11-14 LAB — GLUCOSE, CAPILLARY
GLUCOSE-CAPILLARY: 128 mg/dL — AB (ref 70–99)
Glucose-Capillary: 114 mg/dL — ABNORMAL HIGH (ref 70–99)
Glucose-Capillary: 118 mg/dL — ABNORMAL HIGH (ref 70–99)
Glucose-Capillary: 128 mg/dL — ABNORMAL HIGH (ref 70–99)
Glucose-Capillary: 136 mg/dL — ABNORMAL HIGH (ref 70–99)
Glucose-Capillary: 140 mg/dL — ABNORMAL HIGH (ref 70–99)

## 2013-11-14 LAB — COMPREHENSIVE METABOLIC PANEL
ALBUMIN: 1.9 g/dL — AB (ref 3.5–5.2)
ALT: 39 U/L (ref 0–53)
AST: 57 U/L — AB (ref 0–37)
Alkaline Phosphatase: 809 U/L — ABNORMAL HIGH (ref 39–117)
BILIRUBIN TOTAL: 4.4 mg/dL — AB (ref 0.3–1.2)
BUN: 49 mg/dL — ABNORMAL HIGH (ref 6–23)
CHLORIDE: 103 meq/L (ref 96–112)
CO2: 23 mEq/L (ref 19–32)
CREATININE: 1.44 mg/dL — AB (ref 0.50–1.35)
Calcium: 8.4 mg/dL (ref 8.4–10.5)
GFR calc Af Amer: 49 mL/min — ABNORMAL LOW (ref >90)
GFR calc non Af Amer: 42 mL/min — ABNORMAL LOW (ref >90)
Glucose, Bld: 126 mg/dL — ABNORMAL HIGH (ref 70–99)
Potassium: 3.9 mEq/L (ref 3.7–5.3)
SODIUM: 139 meq/L (ref 137–147)
Total Protein: 5.4 g/dL — ABNORMAL LOW (ref 6.0–8.3)

## 2013-11-14 MED ORDER — FAT EMULSION 20 % IV EMUL
240.0000 mL | INTRAVENOUS | Status: AC
  Administered 2013-11-14: 240 mL via INTRAVENOUS
  Filled 2013-11-14: qty 250

## 2013-11-14 MED ORDER — TRACE MINERALS CR-CU-F-FE-I-MN-MO-SE-ZN IV SOLN
INTRAVENOUS | Status: AC
  Administered 2013-11-14: 19:00:00 via INTRAVENOUS
  Filled 2013-11-14: qty 2000

## 2013-11-14 MED ORDER — INSULIN ASPART 100 UNIT/ML ~~LOC~~ SOLN
0.0000 [IU] | Freq: Three times a day (TID) | SUBCUTANEOUS | Status: AC
  Administered 2013-11-14 (×2): 1 [IU] via SUBCUTANEOUS
  Administered 2013-11-15: 2 [IU] via SUBCUTANEOUS
  Administered 2013-11-16: 1 [IU] via SUBCUTANEOUS

## 2013-11-14 MED ORDER — INSULIN ASPART 100 UNIT/ML ~~LOC~~ SOLN: 0.0000 [IU] | Freq: Every day | SUBCUTANEOUS | Status: DC

## 2013-11-14 NOTE — Progress Notes (Addendum)
Clinical Social Work Department CLINICAL SOCIAL WORK PLACEMENT NOTE 11/14/2013  Patient:  Pietro CassisMARTIN,Tuvia E  Account Number:  [id number] Admit date:  11/01/2013  Clinical Social Worker:  Doroteo GlassmanAMANDA SIMPSON, LCSWA  Date/time:  11/14/2013 11:38 AM  Clinical Social Work is seeking post-discharge placement for this patient at the following level of care:   SKILLED NURSING   (*CSW will update this form in Epic as items are completed)   Declined--[facility] [place]  Patient/family provided with [facility] Department of Clinical Social Work's list of facilities offering this level of care within the geographic area requested by the patient (or if unable, by the patient's family).  11/14/2013  Patient/family informed of their freedom to choose among providers that offer the needed level of care, that participate in Medicare, Medicaid or managed care program needed by the patient, have an available bed and are willing to accept the patient.  11/14/2013  Patient/family informed of [facility]' ownership interest in [facility], as well as of the fact that they are under no obligation to receive care at this facility.  PASARR submitted to EDS on existing PASARR number received from EDS on   FL2 transmitted to all facilities in geographic area requested by pt/family on  11/14/2013 FL2 transmitted to all facilities within larger geographic area on   Patient informed that his/her managed care company has contracts with or will negotiate with  certain facilities, including the following:     Patient/family informed of bed offers received:  No offers provided as [facility]  Patient chooses bed at  Physician recommends and patient chooses bed at    Patient to be transferred to  on  [facility] Patient to be transferred to facility by n/a   CSW signed off. Please re-consult if social work needs arise.   The following physician request  were entered in Epic:   Additional Comments:   Loletta SpecterSuzanna Kidd, MSW, LCSW Clinical Social Work Coverage for [employer]Kelly [place], [place] [phone]

## 2013-11-14 NOTE — Progress Notes (Signed)
Subjective: F/Up Recurrent Pancreatitis. S/P Biliary Drain. Has abdominal bloating, dyspnea improved, having some small BMs Strength improving FIO2 off and cheek bandage off. He reports less edema. Tolerating Clears. TNA Infusing  Objective: Vital signs in last 24 hours: Temp:  [97.8 F (36.6 C)-98.5 F (36.9 C)] 98.5 F (36.9 C) (05/17 0534) Pulse Rate:  [76-81] 76 (05/17 0534) Resp:  [18-22] 22 (05/17 0534) BP: (120-130)/(43-48) 130/43 mmHg (05/17 0534) SpO2:  [100 %] 100 % (05/17 0534) Weight:  [98.431 kg (217 lb)] 98.431 kg (217 lb) (05/17 0458) Weight change: -0.227 kg (-8 oz)   Intake/Output from previous day: 05/16 0701 - 05/17 0700 In: 1454 [P.O.:480; I.V.:230; TPN:744] Out: 2125 [Urine:2125]  PE  General appearance: alert, cooperative and no distress  Resp: clear to auscultation bilaterally  Cardio: regular rate and rhythm and with a 2/6 SEM  GI: mild distension, normal bowel sounds, diffuse tympany, nontender  Extremities: bilateral <1+ edema of arms      Lab Results: No results found for this basename: WBC, HGB, HCT, PLT,  in the last 72 hours BMET  Recent Labs  11/13/13 0520 11/14/13 0510  NA 135* 139  K 3.8 3.9  CL 102 103  CO2 22 23  GLUCOSE 144* 126*  BUN 44* 49*  CREATININE 1.39* 1.44*  CALCIUM 8.4 8.4   CMET CMP     Component Value Date/Time   NA 139 11/14/2013 0510   K 3.9 11/14/2013 0510   CL 103 11/14/2013 0510   CO2 23 11/14/2013 0510   GLUCOSE 126* 11/14/2013 0510   BUN 49* 11/14/2013 0510   CREATININE 1.44* 11/14/2013 0510   CALCIUM 8.4 11/14/2013 0510   PROT 5.4* 11/14/2013 0510   ALBUMIN 1.9* 11/14/2013 0510   AST 57* 11/14/2013 0510   ALT 39 11/14/2013 0510   ALKPHOS 809* 11/14/2013 0510   BILITOT 4.4* 11/14/2013 0510   GFRNONAA 42* 11/14/2013 0510   GFRAA 49* 11/14/2013 0510    CBG (last 3)   Recent Labs  11/13/13 1638 11/13/13 2002 11/14/13 0025  GLUCAP 143* 147* 136*    INR RESULTS:   Lab Results  Component Value  Date   INR 1.06 01/15/2013   INR 0.96 08/14/2011   INR 1.0 08/18/2007     Studies/Results: No results found.  Medications: I have reviewed the patient's current medications.  Assessment/Plan: #1 Pancreatitis from CBD Stricture S/P Biliary stent: slowly improving on TNA/Clears (tolerating s pain). ALP and TB remains elevated. Continue supportive care. Continue clears. GI following. ? Plan c the TNA and whether we are going to advance diet > clears. Continue PICC  #2 Protein calorie malnutrition: slowly improving with TPN. CBGs fine No longer hypoxic #3 CHF/Anasarca: acute and from diastolic dysfunction, Prot Cal Malnutrition, Vol Overload, improving c with lasix.  Continue on 40 mg per day.  Wean O2 as able. #4 Right Renal Lesion: concerning for RCC, will get CT kidneys at some point after he has recovered from current illness. Discussed today #5 CKD - Baseline Cr 1.5.  Current Cr @ 1.4 #6 HTN - fine #7 DVT Proph #8 Known CAD #9 Dispo - Cont PT/OT and expect D/C Mon/Tuesday to home c daughter or SNF depending on needs OT stated 5/16 - SNF;Supervision/Assistance - 24 hour     LOS: 13 days   Gwen PoundsJohn M Elsworth Ledin 11/14/2013, 7:11 AM

## 2013-11-14 NOTE — Progress Notes (Signed)
PARENTERAL NUTRITION CONSULT NOTE   Pharmacy Consult for TPN Indication: Biliary obstruction 2/2 pancreatitis, failed enteral feeding  No Known Allergies  Patient Measurements: Height: 5' 8" (172.7 cm) Weight: 217 lb (98.431 kg) IBW/kg (Calculated) : 68.4 Adjusted Body Weight: 77.3 kg Usual Weight:  Pt thinks he's lost 12 pounds in the past 3 weeks   Vital Signs: Temp: 98.5 F (36.9 C) (05/17 0534) Temp src: Oral (05/17 0534) BP: 130/43 mmHg (05/17 0534) Pulse Rate: 76 (05/17 0534) Intake/Output from previous day: 05/16 0701 - 05/17 0700 In: 1454 [P.O.:480; I.V.:230; TPN:744] Out: 2125 [Urine:2125] Intake/Output from this shift:    Labs: No results found for this basename: WBC, HGB, HCT, PLT, APTT, INR,  in the last 72 hours   Recent Labs  11/12/13 0525 11/13/13 0020 11/13/13 0520 11/14/13 0510  NA 137 136* 135* 139  K 3.8 4.0 3.8 3.9  CL 104 103 102 103  CO2 _0 GLUCOSE 148* 143* 144* 126*  BUN 38* 43* 44* 49*  CREATININE 1.35 1.40* 1.39* 1.44*  CALCIUM 8.5 8.0* 8.4 8.4  MG  --   --  2.0  --   PHOS 3.3  --  3.6  --   PROT 5.3* 5.2* 5.1* 5.4*  ALBUMIN 1.9* 1.7* 1.7* 1.9*  AST 80* 58* 55* 57*  ALT 54* 43 42 39  ALKPHOS 1063* 879* 877* 809*  BILITOT 5.7* 4.6* 4.4* 4.4*   Estimated Creatinine Clearance: 41.1 ml/min (by C-G formula based on Cr of 1.44).    Recent Labs  11/13/13 1638 11/13/13 2002 11/14/13 0025  GLUCAP 143* 147* 136*    Medical History: Past Medical History  Diagnosis Date  . Osteoarthritis   . Gout   . Hypertension   . History of coronary artery disease   . GERD (gastroesophageal reflux disease)   . CHF (congestive heart failure)   . Diastolic dysfunction   . History of GI bleed   . History of pacemaker     Medtronic Adapta ADDro1  . Peptic ulcer disease   . BPH (benign prostatic hypertrophy)   . 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 Hyperlipidemia   . History of cerebrovascular disease   . Kidney  stone   . Depression   . Chest pain   . Pacemaker    Insulin Requirements in the past 24 hours:  5 units insulin on sensitive SSI, no hx DM  Nutritional Goals:  RD recs: 1750-1950 KCal/day, protein 105g/day, fluid 1.7-1.9L/day   Clinimix E5/15 at a goal rate of 105m/hr + IVFE 20% at 1100mhr to provide: 100g/day protein, 1894Kcal/day, ~2L fluid/day avg.  Current Nutrition: CLD  [facility] [age][facility]/5 with abdominal pain, nausea, and vomiting with recent pancreatitis found to have acute worsening of pancreatitis with elevated LFTs and lipase possibly related to passed gallstones and biliary obstruction.  Multiple unsuccessful attempts at placing feeding tubes, therefore TNA started on 5/11. Risk for re-feeding syndrome as pt was not eating for 1 week PTA and has not been able to receive consistent nutrition inpatient.  Proceeded with ERCP with stent 5/13.    TNA access: PICC placed 11/08/13  5/16: D7 TNA   Glucose - most at goal of <150  Electrolytes - all currently WNL  LFTs - AST/ALT, Alk Phos elevated but improving since procedure 5/13  Renal: AoCKD - SCr improved since admission and near baseline of ~1.5, but trending up again along  with BUN   TGs - 160 (5/12), recheck in am  Prealbumin - 14.2 (5/12), recheck in am  Weight - increased almost 40 lbs since admission (?) I >> O until 5/15  Tolerating some CLD, +BMs  Plan:   Decrease Clinimix E 5/15 to 50ml/hr with fat emulsion at 10ml/hr - await GI assessment for advancing diet and when TNA can be dc'd  TNA to contain standard multivitamins daily.  Since Tbili<6, trace elements added back 5/15 but only 1/2 since patient still jaundiced.  Change SSI sensitive to ACHS    TNA lab panels on Mondays & Thursdays.  CMET ordered daily by MD.  Erin Williamson, PharmD, BCPS Pager: [phone]  11/14/2013 7:15 AM      

## 2013-11-14 NOTE — Progress Notes (Signed)
Patient ID: [name] [name], male   DOB: May 18, 1924, 80 y.o.   MRN: [id number] [facility] Progress [facility]  [name] [name] 80 y.o. May 18, 1924   Subjective: Sitting in chair. Feels ok. Denies N/V/abdominal pain. Continues to feel swollen.  Objective: Vital signs in last 24 hours: Filed Vitals:   11/14/13 0534  BP: 130/43  Pulse: 76  Temp: 98.5 F (36.9 C)  Resp: 22    Physical Exam: Gen: elderly, frail, alert, no acute distress Abd: distended, nontender  Lab Results:  Recent Labs  11/12/13 0525  11/13/13 0520 11/14/13 0510  NA 137  < > 135* 139  K 3.8  < > 3.8 3.9  CL 104  < > 102 103  CO2 24  < > 22 23  GLUCOSE 148*  < > 144* 126*  BUN 38*  < > 44* 49*  CREATININE 1.35  < > 1.39* 1.44*  CALCIUM 8.5  < > 8.4 8.4  MG  --   --  2.0  --   PHOS 3.3  --  3.6  --   < > = values in this interval not displayed.  Recent Labs  11/13/13 0520 11/14/13 0510  AST 55* 57*  ALT 42 39  ALKPHOS 877* 809*  BILITOT 4.4* 4.4*  PROT 5.1* 5.4*  ALBUMIN 1.7* 1.9*   No results found for this basename: WBC, NEUTROABS, HGB, HCT, MCV, PLT,  in the last 72 hours No results found for this basename: LABPROT, INR,  in the last 72 hours    Assessment/Plan: 80 yo with CBD stricture and biliary pancreatitis. S/P biliary stent. Bile duct brushing showed rare atypical duct cells (NO malignancy seen in brushings). ALP and TB remains elevated but some improvement in ALP. Continue supportive care. Continue clears. On TPN. Will follow.    Shirley FriarVincent C. Coltyn Hanning 11/14/2013, 11:38 AM

## 2013-11-14 NOTE — Progress Notes (Signed)
Clinical Social Work Department BRIEF PSYCHOSOCIAL ASSESSMENT 11/14/2013  Patient:  [name] [name], [name] [name]     Account Number:  [id number]     Admit date:  11/01/2013  Clinical Social Worker:  Levie Heritage  Date/Time:  11/14/2013 11:49 AM  Referred by:  Physician  Date Referred:  11/14/2013 Referred for  SNF Placement   Other Referral:   Interview type:  Patient Other interview type:    PSYCHOSOCIAL DATA Living Status:  ALONE Admitted from facility:   Level of care:   Primary support name:  Zada Girt Primary support relationship to patient:  CHILD, ADULT Degree of support available:   strong    CURRENT CONCERNS Current Concerns  Post-Acute Placement   Other Concerns:    SOCIAL WORK ASSESSMENT / PLAN Met with Pt to discuss d/c plans.    Pt stated that he has been to [facility] in the past and that he had a very good result.  Pt stated that he'd like to return there for rehab, as it's close to his daughter and he's familiar with that facility.    Pt stated that he'd like to go home but that he understands that, given he lives alone, it's not a good idea.  He's hopeful that he can go to rehab for a week or so and then return home.    Pt declined a SNF list, as he only wants to go to [facility].    CSW thanked Pt for his time.   Assessment/plan status:  Psychosocial Support/Ongoing Assessment of Needs Other assessment/ plan:   Information/referral to community resources:   n/a--declined SNF list    PATIENT'S/FAMILY'S RESPONSE TO PLAN OF CARE: Pt was calm, cooperative and very pleasant.  Pt is happy to go back to [facility] in [place] for rehab and will be even happier when he can go back home.   Bernita Raisin, [place] Social Work [phone]

## 2013-11-14 NOTE — Care Management Note (Signed)
Cm spoke with patient at the bedside concerning discharge planning. Per pt unable to work with PT/OT yesterday related to pain and weakness. PTA pt from home alone. Per pt unable to care for self independently if discharged home. Pt offered choice of HH services in [place]. Per pt would like disposition to [facility] in [place]. CSW notified.    [name] Mannsymeeka Ashantia Amaral,MSN,RN [phone]

## 2013-11-15 DIAGNOSIS — I359 Nonrheumatic aortic valve disorder, unspecified: Secondary | ICD-10-CM

## 2013-11-15 LAB — COMPREHENSIVE METABOLIC PANEL
ALT: 37 U/L (ref 0–53)
AST: 54 U/L — AB (ref 0–37)
Albumin: 2 g/dL — ABNORMAL LOW (ref 3.5–5.2)
Alkaline Phosphatase: 753 U/L — ABNORMAL HIGH (ref 39–117)
BILIRUBIN TOTAL: 4.2 mg/dL — AB (ref 0.3–1.2)
BUN: 49 mg/dL — ABNORMAL HIGH (ref 6–23)
CHLORIDE: 99 meq/L (ref 96–112)
CO2: 26 mEq/L (ref 19–32)
CREATININE: 1.37 mg/dL — AB (ref 0.50–1.35)
Calcium: 8.4 mg/dL (ref 8.4–10.5)
GFR calc Af Amer: 52 mL/min — ABNORMAL LOW (ref >90)
GFR, EST NON AFRICAN AMERICAN: 45 mL/min — AB (ref >90)
Glucose, Bld: 133 mg/dL — ABNORMAL HIGH (ref 70–99)
Potassium: 3.9 mEq/L (ref 3.7–5.3)
Sodium: 137 mEq/L (ref 137–147)
Total Protein: 5.7 g/dL — ABNORMAL LOW (ref 6.0–8.3)

## 2013-11-15 LAB — GLUCOSE, CAPILLARY
GLUCOSE-CAPILLARY: 151 mg/dL — AB (ref 70–99)
GLUCOSE-CAPILLARY: 106 mg/dL — AB (ref 70–99)
Glucose-Capillary: 118 mg/dL — ABNORMAL HIGH (ref 70–99)
Glucose-Capillary: 138 mg/dL — ABNORMAL HIGH (ref 70–99)

## 2013-11-15 LAB — CBC
HEMATOCRIT: 22.1 % — AB (ref 39.0–52.0)
Hemoglobin: 7.8 g/dL — ABNORMAL LOW (ref 13.0–17.0)
MCH: 34.4 pg — AB (ref 26.0–34.0)
MCHC: 35.3 g/dL (ref 30.0–36.0)
MCV: 97.4 fL (ref 78.0–100.0)
Platelets: 171 10*3/uL (ref 150–400)
RBC: 2.27 MIL/uL — ABNORMAL LOW (ref 4.22–5.81)
RDW: 18.4 % — ABNORMAL HIGH (ref 11.5–15.5)
WBC: 5.5 10*3/uL (ref 4.0–10.5)

## 2013-11-15 LAB — DIFFERENTIAL
BASOS ABS: 0 10*3/uL (ref 0.0–0.1)
BASOS PCT: 0 % (ref 0–1)
EOS ABS: 0.3 10*3/uL (ref 0.0–0.7)
EOS PCT: 5 % (ref 0–5)
Lymphocytes Relative: 14 % (ref 12–46)
Lymphs Abs: 0.8 10*3/uL (ref 0.7–4.0)
MONO ABS: 0.5 10*3/uL (ref 0.1–1.0)
Monocytes Relative: 10 % (ref 3–12)
Neutro Abs: 3.9 10*3/uL (ref 1.7–7.7)
Neutrophils Relative %: 71 % (ref 43–77)

## 2013-11-15 LAB — TRIGLYCERIDES: Triglycerides: 110 mg/dL (ref <150)

## 2013-11-15 LAB — PHOSPHORUS: Phosphorus: 4.4 mg/dL (ref 2.3–4.6)

## 2013-11-15 LAB — PREALBUMIN: Prealbumin: 14.1 mg/dL — ABNORMAL LOW (ref 17.0–34.0)

## 2013-11-15 LAB — MAGNESIUM: Magnesium: 1.8 mg/dL (ref 1.5–2.5)

## 2013-11-15 MED ORDER — FAT EMULSION 20 % IV EMUL
250.0000 mL | INTRAVENOUS | Status: AC
  Administered 2013-11-15: 250 mL via INTRAVENOUS
  Filled 2013-11-15: qty 250

## 2013-11-15 MED ORDER — POLYSACCHARIDE IRON COMPLEX 150 MG PO CAPS
150.0000 mg | ORAL_CAPSULE | Freq: Every day | ORAL | Status: DC
  Administered 2013-11-15 – 2013-11-17 (×3): 150 mg via ORAL
  Filled 2013-11-15 (×3): qty 1

## 2013-11-15 MED ORDER — TRACE MINERALS CR-CU-F-FE-I-MN-MO-SE-ZN IV SOLN
INTRAVENOUS | Status: AC
  Administered 2013-11-15: 17:00:00 via INTRAVENOUS
  Filled 2013-11-15: qty 1000

## 2013-11-15 MED ORDER — MAGNESIUM HYDROXIDE 400 MG/5ML PO SUSP
30.0000 mL | Freq: Every day | ORAL | Status: DC
  Administered 2013-11-15 – 2013-11-16 (×2): 30 mL via ORAL
  Filled 2013-11-15 (×2): qty 30

## 2013-11-15 MED ORDER — DEXTROSE 10 % IV SOLN
INTRAVENOUS | Status: DC
  Administered 2013-11-15: 50 mL/h via INTRAVENOUS
  Filled 2013-11-15: qty 1000

## 2013-11-15 NOTE — Progress Notes (Signed)
CSW continuing to follow for disposition planning.  CSW and RNCM met with pt at bedside. Pt reports that he spoke with MD this morning and MD anticipates d/c in 48 hours and pt states that he would prefer to go home. Pt agreeable to [facility] at home and states that he can get additional assistance if needed.  No further social work needs identified at this time.  CSW signing off.   Please re-consult if social work needs arise.  Alison Murray, MSW, LCSW Clinical Social Work Coverage for [employer], [place]

## 2013-11-15 NOTE — Progress Notes (Signed)
Found TNA tubing on the floor disconnected from the patient. IV RN called, d/c'd TNA and was told to connect  D10W at same rate with TNA.

## 2013-11-15 NOTE — Progress Notes (Signed)
O2Sats on room air at rest 99% HR 80 O2 Sats post ambulation on room air 98% HR 94 O2 Sats 10 mins after ambulation on room air 99% HR 92

## 2013-11-15 NOTE — Progress Notes (Signed)
Echocardiogram 2D Echocardiogram has been performed.  Danilo Cappiello M Aily Tzeng 11/15/2013, 3:23 PM 

## 2013-11-15 NOTE — Progress Notes (Signed)
Subjective: Less abdominal pain. Tolerating full liquids. Ambulating halls and sitting up in chair without difficulty.  Objective: Vital signs in last 24 hours: Temp:  [98.1 F (36.7 C)-98.5 F (36.9 C)] 98.5 F (36.9 C) (05/18 0500) Pulse Rate:  [79-94] 92 (05/18 1023) Resp:  [20] 20 (05/18 0500) BP: (136-147)/(48-61) 147/56 mmHg (05/18 0500) SpO2:  [98 %-100 %] 99 % (05/18 1023) Weight:  [98 kg (216 lb 0.8 oz)] 98 kg (216 lb 0.8 oz) (05/18 0500) Weight change: -0.431 kg (-15.2 oz) Last BM Date: 11/14/13  PE: GEN:  NAD, less jaundiced-appearing (improved from my last exam about one week ago). ABD:  Soft, mid periumbilical tenderness to deep palpation, active bowel sounds (an improvement from a week ago), no peritonitis  Lab Results: CBC    Component Value Date/Time   WBC 5.5 11/15/2013 0615   RBC 2.27* 11/15/2013 0615   HGB 7.8* 11/15/2013 0615   HCT 22.1* 11/15/2013 0615   PLT 171 11/15/2013 0615   MCV 97.4 11/15/2013 0615   MCH 34.4* 11/15/2013 0615   MCHC 35.3 11/15/2013 0615   RDW 18.4* 11/15/2013 0615   LYMPHSABS 0.8 11/15/2013 0615   MONOABS 0.5 11/15/2013 0615   EOSABS 0.3 11/15/2013 0615   BASOSABS 0.0 11/15/2013 0615   CMP     Component Value Date/Time   NA 137 11/15/2013 0615   K 3.9 11/15/2013 0615   CL 99 11/15/2013 0615   CO2 26 11/15/2013 0615   GLUCOSE 133* 11/15/2013 0615   BUN 49* 11/15/2013 0615   CREATININE 1.37* 11/15/2013 0615   CALCIUM 8.4 11/15/2013 0615   PROT 5.7* 11/15/2013 0615   ALBUMIN 2.0* 11/15/2013 0615   AST 54* 11/15/2013 0615   ALT 37 11/15/2013 0615   ALKPHOS 753* 11/15/2013 0615   BILITOT 4.2* 11/15/2013 0615   GFRNONAA 45* 11/15/2013 0615   GFRAA 52* 11/15/2013 0615   Assessment:  1.  Pancreatitis, improving. 2.  Obstructive jaundice with biliary stricture (brushings rare atypical cells, no malignancy), improving post ERCP with biliary stent placement.  Plan:  1.  Advancing diet to full liquids today. 2.  Appreciate pharmacy assistance  with dialing down TPN as we are advancing his peroral diet. 3.  Follow LFTs. 4.  Hopefully home in a few days; goal would be to follow-up with me in office in a few weeks, likely reimage (CT versus MRI) his pancreas in a couple months, remove +/- replace his bile duct stent in a few months. 5.  Will follow.   [name] ModenaWilliam Sherra [name] 11/15/2013, 10:57 AM

## 2013-11-15 NOTE — Progress Notes (Signed)
PT Cancellation Note  Patient Details Name: [name] [name] MRN: [id number] DOB: 06-27-1924   Cancelled Treatment:    Reason Eval/Treat Not Completed: Patient at procedure or test/unavailable Housekeeping cleaning floor and upon waiting to dry, person to perform ECHO entered room.  Will check back as schedule permits.    Lynnell CatalanKatherine E Daryle Boyington 11/15/2013, 3:21 PM Zenovia JarredKati Mirela Parsley, PT, DPT 11/15/2013 Pager: [phone]

## 2013-11-15 NOTE — Progress Notes (Signed)
Subjective: No nausea or abdominal pain, mild DOE. He would prefer to go home rather than to a SNF if possible  Objective: Vital signs in last 24 hours: Temp:  [98.1 F (36.7 C)-98.5 F (36.9 C)] 98.5 F (36.9 C) (05/18 0500) Pulse Rate:  [79-85] 80 (05/18 0500) Resp:  [20] 20 (05/18 0500) BP: (136-147)/(48-61) 147/56 mmHg (05/18 0500) SpO2:  [98 %-100 %] 98 % (05/18 0500) Weight:  [98 kg (216 lb 0.8 oz)] 98 kg (216 lb 0.8 oz) (05/18 0500) Weight change: -0.431 kg (-15.2 oz)   Intake/Output from previous day: 05/17 0701 - 05/18 0700 In: 1440 [P.O.:1080; I.V.:120; TPN:240] Out: 2625 [Urine:2625]   General appearance: alert, cooperative and no distress Resp: clear to auscultation bilaterally Cardio: regular rate and rhythm and with a 2/6 SEM GI: bowel sounds normal, mild distension with tympany Extremities: bilateral 1+ upper extremity edema, some erythema around the RUE PICC site  Lab Results:  Recent Labs  11/15/13 0615  WBC 5.5  HGB 7.8*  HCT 22.1*  PLT 171   BMET  Recent Labs  11/14/13 0510 11/15/13 0615  NA 139 137  K 3.9 3.9  CL 103 99  CO2 23 26  GLUCOSE 126* 133*  BUN 49* 49*  CREATININE 1.44* 1.37*  CALCIUM 8.4 8.4   CMET CMP     Component Value Date/Time   NA 137 11/15/2013 0615   K 3.9 11/15/2013 0615   CL 99 11/15/2013 0615   CO2 26 11/15/2013 0615   GLUCOSE 133* 11/15/2013 0615   BUN 49* 11/15/2013 0615   CREATININE 1.37* 11/15/2013 0615   CALCIUM 8.4 11/15/2013 0615   PROT 5.7* 11/15/2013 0615   ALBUMIN 2.0* 11/15/2013 0615   AST 54* 11/15/2013 0615   ALT 37 11/15/2013 0615   ALKPHOS 753* 11/15/2013 0615   BILITOT 4.2* 11/15/2013 0615   GFRNONAA 45* 11/15/2013 0615   GFRAA 52* 11/15/2013 0615    CBG (last 3)   Recent Labs  11/14/13 1150 11/14/13 1725 11/14/13 2018  GLUCAP 128* 128* 118*    INR RESULTS:   Lab Results  Component Value Date   INR 1.06 01/15/2013   INR 0.96 08/14/2011   INR 1.0 08/18/2007     Studies/Results: Dg  Chest 2 View  11/14/2013   CLINICAL DATA:  Cough for 1 week  EXAM: CHEST  2 VIEW  COMPARISON:  11/11/2013  FINDINGS: There is a right arm PICC line with tip in the projection of the cavoatrial junction. There is a left chest wall pacer device with leads in the right atrial appendage right ventricle. The heart size appears normal. There are small bilateral pleural effusions which are decreased in volume from previous exam. No significant interstitial edema.  IMPRESSION: 1. Decrease in volume of pleural effusions. 2. No pneumonia identified.   Electronically Signed   By: Signa Kellaylor  Stroud M.D.   On: 11/14/2013 11:45    Medications: I have reviewed the patient's current medications.  Assessment/Plan: #1 Pancreatitis: slowly improving, and have advanced diet to full liquids today. Will check amylase and lipase tomorrow, advance to regular diet tomorrow, anticipate discharge within 48 hours. #2 Dyspnea: mild and due to diastolic dysfunction and low albumin with pleural effusions and anemia. Will check echo today, BNP in the AM and make sure that he is on iron. #3 Constipation: moderate and will adjust meds accordingly   LOS: 14 days   Jarome MatinDaniel Sharece Fleischhacker 11/15/2013, 7:31 AM

## 2013-11-15 NOTE — Progress Notes (Signed)
PARENTERAL NUTRITION CONSULT NOTE   Pharmacy Consult for TPN Indication: Biliary obstruction 2/2 pancreatitis, failed enteral feeding  No Known Allergies  Patient Measurements: Height: 5' 8"  (172.7 cm) Weight: 216 lb 0.8 oz (98 kg) IBW/kg (Calculated) : 68.4 Adjusted Body Weight: 77.3 kg Usual Weight:  Pt thinks he's lost 12 pounds in the past 3 weeks   Vital Signs: Temp: 98.5 F (36.9 C) (05/18 0500) Temp src: Oral (05/18 0500) BP: 147/56 mmHg (05/18 0500) Pulse Rate: 80 (05/18 0500) Intake/Output from previous day: 05/17 0701 - 05/18 0700 In: 1440 [P.O.:1080; I.V.:120; TPN:240] Out: 2625 [Urine:2625] Intake/Output from this shift:    Labs:  Recent Labs  11/15/13 0615  WBC 5.5  HGB 7.8*  HCT 22.1*  PLT 171     Recent Labs  11/13/13 0520 11/14/13 0510 11/15/13 0615  NA 135* 139 137  K 3.8 3.9 3.9  CL 102 103 99  CO2 22 23 26   GLUCOSE 144* 126* 133*  BUN 44* 49* 49*  CREATININE 1.39* 1.44* 1.37*  CALCIUM 8.4 8.4 8.4  MG 2.0  --  1.8  PHOS 3.6  --  4.4  PROT 5.1* 5.4* 5.7*  ALBUMIN 1.7* 1.9* 2.0*  AST 55* 57* 54*  ALT 42 39 37  ALKPHOS 877* 809* 753*  BILITOT 4.4* 4.4* 4.2*   Estimated Creatinine Clearance: 43.1 ml/min (by C-G formula based on Cr of 1.37).    Recent Labs  11/14/13 1150 11/14/13 1725 11/14/13 2018  GLUCAP 128* 128* 118*    Medical History: Past Medical History  Diagnosis Date  . Osteoarthritis   . Gout   . Hypertension   . History of coronary artery disease   . GERD (gastroesophageal reflux disease)   . CHF (congestive heart failure)   . Diastolic dysfunction   . History of GI bleed   . History of pacemaker     Medtronic Adapta ADDro1  . Peptic ulcer disease   . BPH (benign prostatic hypertrophy)   . 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 Hyperlipidemia   . History of cerebrovascular disease   . Kidney stone   . Depression   . Chest pain   . Pacemaker    Insulin Requirements in the past 24 hours:   3 units insulin on sensitive SSI, no hx DM  Nutritional Goals:  RD recs: 1750-1950 KCal/day, protein 105g/day, fluid 1.7-1.9L/day   Clinimix E5/15 at a goal rate of 61m/hr + IVFE 20% at 129mhr to provide: 100g/day protein, 1894Kcal/day, ~2L fluid/day avg.  Current Nutrition: CLD  [facility] [age][facility]5 with abdominal pain, nausea, and vomiting with recent pancreatitis found to have acute worsening of pancreatitis with elevated LFTs and lipase possibly related to passed gallstones and biliary obstruction.  Multiple unsuccessful attempts at placing feeding tubes, therefore TNA started on 5/11. Risk for re-feeding syndrome as pt was not eating for 1 week PTA and has not been able to receive consistent nutrition inpatient.  Proceeded with ERCP with stent 5/13.    TNA access: PICC placed 11/08/13  5/18: D8 TNA   Glucose - controlled; at goal of <150  Electrolytes - all currently WNL, Corr Ca = 10  LFTs - AST/ALT, Alk Phos elevated but improving since procedure 5/13  Renal: AoCKD - SCr improved since admission and near baseline of ~1.56m30ml  TGs - 160 (5/12), pending (5/18)  Prealbumin - 14.2 (5/12), pending (5/18)  Weight - increased from 82kg to  99kg from 5/13 to 5/15! Volume overload per MD notes, Per I/), +23L for hospitalization. Negative fluid balance last 48h  Diet - full liquids, +BMs  Plan:   Per medicine note, possible discharge in next 48hrs. Diet advanced to full liquids this morning with plans to change to regular diet 5/19 as tolerates  Decrease Clinimix E 5/15 to 69m/hr with fat emulsion at 554mhr  TNA to contain standard multivitamins daily.  Since Tbili<6, trace elements added back 5/15 but only 1/2 since patient still jaundiced.  Change SSI sensitive to ACHS    TNA lab panels on Mondays & Thursdays.  CMET ordered daily by MD.  DuDoreene ElandPharmD, BCPS.   Pager: [phone]/18/2015 7:22 AM

## 2013-11-16 LAB — GLUCOSE, CAPILLARY
GLUCOSE-CAPILLARY: 137 mg/dL — AB (ref 70–99)
GLUCOSE-CAPILLARY: 115 mg/dL — AB (ref 70–99)
Glucose-Capillary: 118 mg/dL — ABNORMAL HIGH (ref 70–99)
Glucose-Capillary: 142 mg/dL — ABNORMAL HIGH (ref 70–99)

## 2013-11-16 LAB — PRO B NATRIURETIC PEPTIDE: PRO B NATRI PEPTIDE: 888.7 pg/mL — AB (ref 0–450)

## 2013-11-16 LAB — COMPREHENSIVE METABOLIC PANEL
ALT: 36 U/L (ref 0–53)
AST: 54 U/L — ABNORMAL HIGH (ref 0–37)
Albumin: 2 g/dL — ABNORMAL LOW (ref 3.5–5.2)
Alkaline Phosphatase: 685 U/L — ABNORMAL HIGH (ref 39–117)
BUN: 45 mg/dL — ABNORMAL HIGH (ref 6–23)
CO2: 30 mEq/L (ref 19–32)
Calcium: 8.4 mg/dL (ref 8.4–10.5)
Chloride: 97 mEq/L (ref 96–112)
Creatinine, Ser: 1.4 mg/dL — ABNORMAL HIGH (ref 0.50–1.35)
GFR calc Af Amer: 51 mL/min — ABNORMAL LOW (ref >90)
GFR calc non Af Amer: 44 mL/min — ABNORMAL LOW (ref >90)
Glucose, Bld: 113 mg/dL — ABNORMAL HIGH (ref 70–99)
Potassium: 3.8 mEq/L (ref 3.7–5.3)
SODIUM: 137 meq/L (ref 137–147)
Total Bilirubin: 3.6 mg/dL — ABNORMAL HIGH (ref 0.3–1.2)
Total Protein: 5.8 g/dL — ABNORMAL LOW (ref 6.0–8.3)

## 2013-11-16 LAB — AMYLASE: Amylase: 187 U/L — ABNORMAL HIGH (ref 0–105)

## 2013-11-16 LAB — LIPASE, BLOOD: LIPASE: 685 U/L — AB (ref 11–59)

## 2013-11-16 MED ORDER — BOOST / RESOURCE BREEZE PO LIQD: 1.0000 | ORAL | Status: DC

## 2013-11-16 MED ORDER — PANTOPRAZOLE SODIUM 40 MG PO TBEC
40.0000 mg | DELAYED_RELEASE_TABLET | Freq: Every day | ORAL | Status: DC
  Administered 2013-11-16 – 2013-11-17 (×2): 40 mg via ORAL
  Filled 2013-11-16 (×2): qty 1

## 2013-11-16 NOTE — Progress Notes (Signed)
NUTRITION FOLLOW UP  Intervention:   -Educated on Pancreatitis nutrition therapy -Assisted with meal ordering-encouraged low fat food items -Modify to Resource Breeze Q24H -Will continue to monitor  Nutrition Dx:   Inadequate oral intake related to inability to eat as evidenced by NPO-ongoing, improving with regular diet Goal:   Advance diet as tolerated to low fat diet -unmet   Monitor:   TF tolerance, total protein/energy intake, diet advancement, weight, labs  Assessment:   5/05: Pt with hx of recent pancreatitis, admitted with abdominal pain that started yesterday. Pt discussed during multidisciplinary rounds.  -Met with pt who c/o lack of hunger for the past 3 weeks  -Spent the past 7 days consuming 20-30oz of Gatorade, did not eat anything  -Thinks he's lost 12 pounds in the past 3 weeks  -Before previous admission he was eating whatever he wanted to, 3 meals/day, denies any specific high fat foods he regularly consumed  -C/o diarrhea that started yesterday afternoon and denied any vomiting but did c/o nausea in the past week  -Noted MD discussed with pt possible post-pyloric TF, awaiting GI evaluation  BUN/Cr elevated with low GFR  Alk phos, lipase, AST/ALT, and total bilirubin elevated   5/08: -Pt to have panda tube placed on 5/08 and to initiate nasojejunal tube feds -Glucerna 1.0 at 50 ml/hr has been ordered. This regimen will provide 1200 kcal (69% est kcal needs), and 50 gram protein (48% est protein needs) -Would benefit from addition of Pro-Stat 30 ml BID to meet at least 75% estimated nutrition needs -No orders have been placed outlining TF advancement. -Consider modifying to elemental formula (Vital 1.2) if pt exhibits intolerance, diarrhea or steatorrhea -Continue to recommend slow advancement for pt's risk of refeeding -Elevated LFT -K WNL -BUN/Crt elevated d/t AKI on CKD, improving with IVF. Receiving approx 3L fluid/day  5/11: -Pt with multiple unsuccesful  attempts of NJ tube replacement.  -Was unable to receive any enteral nutrition -Pt to have PICC lined placed today per discussion with RN -Per pharmacy note, plan to Start Clinimix E 5/15 at 23m/hr with fat emulsion at 131mhr -Goal rate of Clinimix E5/15 at 83 ml/hr with IVFE 20% at 10 ml/hr will provide 100g/day protein, 1894Kcal/day, ~2L. This will meet 100% est kcal needs and 95% est protein needs  -Discussed pt with pharmacist regarding pt's risk of refeeding. K/Mg WNL, phos slightly low at 2.1. Plan to continue to monitor; electrolytes added in TPN -Elevated LFT -Elevated Lipase -Crt elevated at 1.48 but improved, will continue to monitor with TPN advancement and adjust protein needs as warranted -Glucose WNL  5/14: -Pt tolerating TPN.  -Was advanced to goal rate of Clinimix E5/15 at 83 ml/hr with IVFE 20% at 10 ml/hr.  - This provides 100 g/day protein, 1894Kcal/day (100% est kcal needs and 95% est protein needs) -S/P ERCP. Total bilirubin and Alk phos trending down -Lipase remains elevated -Advanced to clear liquid diet. Pt tolerating >50% soup/jello/juice w/out nausea or abd pain. Was willing to try Resource Breeze supplement for additional kcal/protein. Pt eager for diet advancement. -Pt with elevated CBS's post procedure, no hx of DM. Will monitor post supplementation -Phos 2.1L, being replaced by KPhos. Plan to d/c trace elements and add Zinc, Chromium, Selenium per pharmacy -Mg/K WNL -Pt with hypoactive BS per RN note  5/19: -Pt diet advanced to Full Liquid on 5/18, and Regular on 5/19 -Tolerating without nausea,vomiting or abd pain -Has been enjoying and consuming ReLubrizol CorporationWill modify to once daily as  pt with good appetite. Increase to BID as needed; informed pt of availability of supplement at [place] to consume as warranted upon d/c -Discussed pancreatitis nutrition therapy, provided pt with  "Pancreatitis Nutrition Therapy" handout from the Academy  of Nutrition and Dietetics. Assisted with meal ordering to assist with diet tolerance -Pharm noted plan to d/c TPN once current bag runs out -GI noted pancreatitis labs improving-amylase/lipase elevated but trending down -CBG's elevated  Height: Ht Readings from Last 1 Encounters:  11/10/13 5' 8"  (1.727 m)    Weight Status:   Wt Readings from Last 1 Encounters:  11/16/13 201 lb 11.2 oz (91.491 kg)    Re-estimated needs:  Kcal: 1750-1950  Protein: 105g  Fluid: 1.7-1.9L/day   Skin: WDL  Diet Order: General   Intake/Output Summary (Last 24 hours) at 11/16/13 1605 Last data filed at 11/16/13 1300  Gross per 24 hour  Intake   1140 ml  Output    425 ml  Net    715 ml    Last BM: 5/18   Labs:   Recent Labs Lab 11/11/13 0454  11/12/13 0525  11/13/13 0520 11/14/13 0510 11/15/13 0615 11/16/13 0500  NA 142  < > 137  < > 135* 139 137 137  K 3.9  < > 3.8  < > 3.8 3.9 3.9 3.8  CL 110  < > 104  < > 102 103 99 97  CO2 23  < > 24  < > 22 23 26 30   BUN 32*  < > 38*  < > 44* 49* 49* 45*  CREATININE 1.25  < > 1.35  < > 1.39* 1.44* 1.37* 1.40*  CALCIUM 8.5  < > 8.5  < > 8.4 8.4 8.4 8.4  MG 2.1  --   --   --  2.0  --  1.8  --   PHOS 2.1*  --  3.3  --  3.6  --  4.4  --   GLUCOSE 182*  < > 148*  < > 144* 126* 133* 113*  < > = values in this interval not displayed.  CBG (last 3)   Recent Labs  11/15/13 2101 11/16/13 0730 11/16/13 1145  GLUCAP 138* 118* 137*    Scheduled Meds: . enoxaparin (LOVENOX) injection  40 mg Subcutaneous Q24H  . feeding supplement (RESOURCE BREEZE)  1 Container Oral BID  . furosemide  40 mg Oral Daily  . insulin aspart  0-5 Units Subcutaneous QHS  . insulin aspart  0-9 Units Subcutaneous TID WC  . iron polysaccharides  150 mg Oral Daily  . magnesium hydroxide  30 mL Oral Daily  . metoCLOPramide  5 mg Oral TID AC  . pantoprazole  40 mg Oral Daily  . spironolactone  25 mg Oral Daily    Continuous Infusions: . sodium chloride 10 mL/hr at  11/16/13 0602  . dextrose Stopped (11/15/13 1719)  . Marland KitchenTPN (CLINIMIX-E) Adult 40 mL/hr at 11/15/13 1718   And  . fat emulsion 250 mL (11/15/13 1718)    Atlee Abide MS RD LDN Clinical Dietitian [phone]

## 2013-11-16 NOTE — Progress Notes (Signed)
Subjective: Feels OK with no nausea or dyspnea at rest. Tolerated full liquids without problems.  Objective: Vital signs in last 24 hours: Temp:  [98 F (36.7 C)-98.6 F (37 C)] 98 F (36.7 C) (05/19 0426) Pulse Rate:  [78-92] 78 (05/19 0426) Resp:  [20] 20 (05/19 0426) BP: (139-141)/(45-51) 140/51 mmHg (05/19 0426) SpO2:  [98 %-99 %] 99 % (05/19 0426) Weight:  [91.491 kg (201 lb 11.2 oz)] 91.491 kg (201 lb 11.2 oz) (05/19 0426) Weight change: -6.509 kg (-14 lb 5.6 oz)   Intake/Output from previous day: 05/18 0701 - 05/19 0700 In: 1260 [P.O.:600; I.V.:120; TPN:540] Out: 425 [Urine:425]   General appearance: alert, cooperative and no distress Resp: clear to auscultation bilaterally Cardio: regular rate and rhythm and with a 2/6 SEM GI: soft, non-tender; bowel sounds normal; no masses,  no organomegaly Extremities: 2+ edema of all extremities  Lab Results:  Recent Labs  11/15/13 0615  WBC 5.5  HGB 7.8*  HCT 22.1*  PLT 171   BMET  Recent Labs  11/15/13 0615 11/16/13 0500  NA 137 137  K 3.9 3.8  CL 99 97  CO2 26 30  GLUCOSE 133* 113*  BUN 49* 45*  CREATININE 1.37* 1.40*  CALCIUM 8.4 8.4   CMET CMP     Component Value Date/Time   NA 137 11/16/2013 0500   K 3.8 11/16/2013 0500   CL 97 11/16/2013 0500   CO2 30 11/16/2013 0500   GLUCOSE 113* 11/16/2013 0500   BUN 45* 11/16/2013 0500   CREATININE 1.40* 11/16/2013 0500   CALCIUM 8.4 11/16/2013 0500   PROT 5.8* 11/16/2013 0500   ALBUMIN 2.0* 11/16/2013 0500   AST 54* 11/16/2013 0500   ALT 36 11/16/2013 0500   ALKPHOS 685* 11/16/2013 0500   BILITOT 3.6* 11/16/2013 0500   GFRNONAA 44* 11/16/2013 0500   GFRAA 51* 11/16/2013 0500    CBG (last 3)   Recent Labs  11/15/13 1620 11/15/13 2101 11/16/13 0730  GLUCAP 106* 138* 118*    INR RESULTS:   Lab Results  Component Value Date   INR 1.06 01/15/2013   INR 0.96 08/14/2011   INR 1.0 08/18/2007     Studies/Results: Dg Chest 2 View  11/14/2013   CLINICAL DATA:   Cough for 1 week  EXAM: CHEST  2 VIEW  COMPARISON:  11/11/2013  FINDINGS: There is a right arm PICC line with tip in the projection of the cavoatrial junction. There is a left chest wall pacer device with leads in the right atrial appendage right ventricle. The heart size appears normal. There are small bilateral pleural effusions which are decreased in volume from previous exam. No significant interstitial edema.  IMPRESSION: 1. Decrease in volume of pleural effusions. 2. No pneumonia identified.   Electronically Signed   By: Signa Kellaylor  Stroud M.D.   On: 11/14/2013 11:45    Medications: I have reviewed the patient's current medications.  Assessment/Plan: #1 Pancreatitis: stable and will advance to a regular diet today, recheck amylase and lipase in the am, and if continues to improve will discharge tomorrow. #2 CHF: acute, from diastolic dysfunction, and improving on current meds.  #3 Protein calorie malnutrition: slowly improving. Will discontinue TPN after current bag is empty.   LOS: 15 days   [name] [name] 11/16/2013, 9:14 AM

## 2013-11-16 NOTE — Progress Notes (Signed)
Subjective: Abdominal pain continues to improve. Tolerated solid diet.  Objective: Vital signs in last 24 hours: Temp:  [98 F (36.7 C)-98.6 F (37 C)] 98 F (36.7 C) (05/19 0426) Pulse Rate:  [78-82] 78 (05/19 0426) Resp:  [20] 20 (05/19 0426) BP: (139-141)/(45-51) 140/51 mmHg (05/19 0426) SpO2:  [98 %-99 %] 99 % (05/19 0426) Weight:  [91.491 kg (201 lb 11.2 oz)] 91.491 kg (201 lb 11.2 oz) (05/19 0426) Weight change: -6.509 kg (-14 lb 5.6 oz) Last BM Date: 11/15/13  PE: GEN:  Less jaundiced, pleasant, NAD ABD:  Soft, protuberant, active bowel sounds SKIN:  Scattered ecchymoses  Lab Results: CBC    Component Value Date/Time   WBC 5.5 11/15/2013 0615   RBC 2.27* 11/15/2013 0615   HGB 7.8* 11/15/2013 0615   HCT 22.1* 11/15/2013 0615   PLT 171 11/15/2013 0615   MCV 97.4 11/15/2013 0615   MCH 34.4* 11/15/2013 0615   MCHC 35.3 11/15/2013 0615   RDW 18.4* 11/15/2013 0615   LYMPHSABS 0.8 11/15/2013 0615   MONOABS 0.5 11/15/2013 0615   EOSABS 0.3 11/15/2013 0615   BASOSABS 0.0 11/15/2013 0615   CMP     Component Value Date/Time   NA 137 11/16/2013 0500   K 3.8 11/16/2013 0500   CL 97 11/16/2013 0500   CO2 30 11/16/2013 0500   GLUCOSE 113* 11/16/2013 0500   BUN 45* 11/16/2013 0500   CREATININE 1.40* 11/16/2013 0500   CALCIUM 8.4 11/16/2013 0500   PROT 5.8* 11/16/2013 0500   ALBUMIN 2.0* 11/16/2013 0500   AST 54* 11/16/2013 0500   ALT 36 11/16/2013 0500   ALKPHOS 685* 11/16/2013 0500   BILITOT 3.6* 11/16/2013 0500   GFRNONAA 44* 11/16/2013 0500   GFRAA 51* 11/16/2013 0500   Assessment:  1. Pancreatitis, improving.  2. Obstructive jaundice with biliary stricture (brushings rare atypical cells, no malignancy), improving post ERCP with biliary stent placement.  Plan:  1.  Advancing diet.  Stop TPN once current bag(s) have infused. 2.  Plan for discharge tomorrow. 3.  Follow-up with me in office in a few weeks.   Willis ModenaWilliam Jena Tegeler 11/16/2013, 1:01 PM

## 2013-11-16 NOTE — Progress Notes (Signed)
Physical Therapy Treatment Patient Details Name: Pietro Cassishomas E Frimpong MRN: [id number] DOB: 07-26-1923 Today's Date: 11/16/2013    History of Present Illness Pt is an 80 year old male admitted 5/4 for biliary obstruction related to pancreatitis and PHMx of pacemaker, CAD, HTN, gout, CHF, OA.  Pt s/p ERCP 5/13    PT Comments    Pt feeling better and keeping down solids.  Assisted OOB to amb twice with one sitting rest break to check vitals.  Amb with out any AD and with out holding to IV pole.  Pt stated "it looks like I am going home tomorrow". No steps to enter home, pt stated he has a stair lift that was originally put in for his wife who is now deceased.   Follow Up Recommendations  Home health PT     Equipment Recommendations  None recommended by PT    Recommendations for Other Services       Precautions / Restrictions Precautions Precautions: Fall    Mobility  Bed Mobility Overal bed mobility: Needs Assistance       Supine to sit: Modified independent (Device/Increase time) Sit to supine: Modified independent (Device/Increase time)   General bed mobility comments: increased time  Transfers Overall transfer level: Needs assistance Equipment used: None Transfers: Sit to/from Stand Sit to Stand: Supervision         General transfer comment: cues for safety  Ambulation/Gait Ambulation/Gait assistance: Min guard;Supervision Ambulation Distance (Feet): 550 Feet Assistive device: None Gait Pattern/deviations: Step-through pattern Gait velocity: WFL   General Gait Details: good alternating gait with no AD and no holding to IV ploe.  RA 98% and HR 112.   Stairs            Wheelchair Mobility    Modified Rankin (Stroke Patients Only)       Balance                                    Cognition                            Exercises      General Comments        Pertinent Vitals/Pain No c/o pain    Home Living                      Prior Function            PT Goals (current goals can now be found in the care plan section) Progress towards PT goals: Progressing toward goals    Frequency  Min 3X/week    PT Plan      Co-evaluation             End of Session Equipment Utilized During Treatment: Gait belt Activity Tolerance: Patient tolerated treatment well Patient left: in bed;with call bell/phone within reach;with bed alarm set     Time: 1550-1615 PT Time Calculation (min): 25 min  Charges:  $Gait Training: 23-37 mins                    G Codes:      Felecia ShellingLori Lavetta Geier  PTA [facility]  Acute  Rehab Pager      [phone]

## 2013-11-16 NOTE — Progress Notes (Addendum)
[place] NOTE   Pharmacy Consult for TPN Indication: Biliary obstruction 2/2 pancreatitis, failed enteral feeding  No Known Allergies  Patient Measurements: Height: 5' 8"  (172.7 cm) Weight: 201 lb 11.2 oz (91.491 kg) IBW/kg (Calculated) : 68.4 Adjusted Body Weight: 77.3 kg Usual Weight:  Pt thinks he's lost 12 pounds in the past 3 weeks (prior to admission)  Vital Signs: Temp: 98 F (36.7 C) (05/19 0426) Temp src: Oral (05/19 0426) BP: 140/51 mmHg (05/19 0426) Pulse Rate: 78 (05/19 0426) Intake/Output from previous day: 05/18 0701 - 05/19 0700 In: 1260 [P.O.:600; I.V.:120; TPN:540] Out: 425 [Urine:425] Intake/Output from this shift:    Labs:  Recent Labs  11/15/13 0615  WBC 5.5  HGB 7.8*  HCT 22.1*  PLT 171     Recent Labs  11/14/13 0510 11/15/13 0615 11/16/13 0500  NA 139 137 137  K 3.9 3.9 3.8  CL 103 99 97  CO2 23 26 30   GLUCOSE 126* 133* 113*  BUN 49* 49* 45*  CREATININE 1.44* 1.37* 1.40*  CALCIUM 8.4 8.4 8.4  MG  --  1.8  --   PHOS  --  4.4  --   PROT 5.4* 5.7* 5.8*  ALBUMIN 1.9* 2.0* 2.0*  AST 57* 54* 54*  ALT 39 37 36  ALKPHOS 809* 753* 685*  BILITOT 4.4* 4.2* 3.6*  PREALBUMIN  --  14.1*  --   TRIG  --  110  --    Estimated Creatinine Clearance: 40.8 ml/min (by C-G formula based on Cr of 1.4).    Recent Labs  11/15/13 1620 11/15/13 2101 11/16/13 0730  GLUCAP 106* 138* 118*    Medical History: Past Medical History  Diagnosis Date  . Osteoarthritis   . Gout   . Hypertension   . History of coronary artery disease   . GERD (gastroesophageal reflux disease)   . CHF (congestive heart failure)   . Diastolic dysfunction   . History of GI bleed   . History of pacemaker     Medtronic Adapta ADDro1  . Peptic ulcer disease   . BPH (benign prostatic hypertrophy)   . 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 Hyperlipidemia   . History of cerebrovascular disease   . Kidney stone   . Depression   . Chest  pain   . Pacemaker    Insulin Requirements in the past 24 hours:  2 units insulin on sensitive SSI, no hx DM  Nutritional Goals:  RD recs: 1750-1950 KCal/day, protein 105g/day, fluid 1.7-1.9L/day   Clinimix E5/15 at a goal rate of 16m/hr + IVFE 20% at 133mhr to provide: 100g/day protein, 1894Kcal/day, ~2L fluid/day avg.  Current Nutrition: CLD  [facility] [age][facility] admitted 5/5 with abdominal pain, nausea, and vomiting with recent pancreatitis found to have acute worsening of pancreatitis with elevated LFTs and lipase possibly related to passed gallstones and biliary obstruction.  Multiple unsuccessful attempts at placing feeding tubes, therefore TNA started on 5/11. Risk for re-feeding syndrome as pt was not eating for 1 week PTA and has not been able to receive consistent nutrition inpatient.  Proceeded with ERCP with stent 5/13.    TNA access: PICC placed 11/08/13  5/18: D8 TNA   Glucose - controlled; at goal of <150  Electrolytes - all currently WNL, Corr Ca = 10  LFTs - AST slightly elevated (but has improved), Alk Phos elevated but improving since procedure 5/13  Renal: AoCKD - SCr  improved since admission and near baseline of ~1.24m/dl  TGs - 160 (5/12), 110 (5/18)  Prealbumin - 14.2 (5/12), 14.1(5/18)  Weight - Current wt = 91.4 Kg (improved.  Weight had increased from 82kg to 99kg from 5/13 to 5/15! Volume overload per MD notes, Per I/O= +23L for hospitalization. Fluid balance even 5/18. On lasix 486mPO daily  Diet - full liquids, +BMs  Plan:   Per medicine note, possible discharge Wednesday. Diet advanced to full liquids 5/18 with plans to change to regular diet 5/19 as tolerates  Decrease Clinimix E 5/15 to 2063mr at 4pm and turn off TPN at 18:00, Continue fat emulsion at 5ml92m and d/c at 18:00.  Ok to d/c TPN per IM note.   TNA contains standard multivitamins daily.  Since Tbili<6, trace elements added back 5/15 but only 1/2 since patient  still jaundiced (jaundice improving).  D/C SSI   D/C TPN labs   The patient is receiving Protonix by the intravenous route.  Based on criteria approved by the Pharmacy and TherSimse medication is being converted to the equivalent oral dose form.  These criteria include: -No Active GI bleeding -Able to tolerate diet of full liquids (or better) or tube feeding -Able to tolerate other medications by the oral or enteral route  If you have any questions about this conversion, please contact the Pharmacy Department (phone [phone] you.  DustDoreene ElandarmD, BCPS.   Pager: [phone]/2015 8:27 AM

## 2013-11-16 NOTE — Progress Notes (Signed)
Occupational Therapy Treatment Patient Details Name: [name] [name] MRN: [id number] DOB: 11/24/1923 Today's Date: 11/16/2013    History of present illness Pt is an 80 year old male admitted 5/4 for biliary obstruction related to pancreatitis and PHMx of pacemaker, CAD, HTN, gout, CHF, OA.  Pt s/p ERCP 5/13   OT comments  Pt is doing well today with ADL. Up in room to perform tasks with supervision, no device. Min assist only for washing feet but states he usually has assist with this or has a long handled sponge. Pt planning d/c home and will have daughter in and out to assist. He states he doesn't feel he needs HHOT and feel he will do ok with daughter assisting PRN.   Follow Up Recommendations  Supervision - Intermittent;No OT follow up    Equipment Recommendations  None recommended by OT    Recommendations for Other Services      Precautions / Restrictions Precautions Precautions: Fall       Mobility Bed Mobility   Bed Mobility: Supine to Sit;Sit to Supine     Supine to sit: Modified independent (Device/Increase time) Sit to supine: Modified independent (Device/Increase time)      Transfers Overall transfer level: Needs assistance Equipment used: None Transfers: Sit to/from Stand Sit to Stand: Supervision              Balance                                   ADL       Grooming: Standing;Supervision/safety   Upper Body Bathing: Supervision/ safety;Standing   Lower Body Bathing: Minimal assistance;Sit to/from stand Lower Body Bathing Details (indicate cue type and reason): min assist for R foot. Pt states he usually uses a long handled sponge or daughter assists.          Toilet Transfer: supervision;Ambulation;Comfort height toilet;Grab bars   Toileting- Clothing Manipulation and Hygiene: Sit to/from stand;Supervision/safety       Functional mobility during ADLs: Supervision/safety (IV pole) General ADL Comments: Discussed  safety with having supervision initially for showering and not showering while home alone. Pt states daughter can come by and check on him and help with meals, laundry, and shower. She usually helps with these tasks at home PTA. He has a [place] but usually uses a tubseat. He states he can get the bench out if he has any difficulty with stepping over tub.       Vision                     Perception     Praxis      Cognition   Behavior During Therapy: WFL for tasks assessed/performed Overall Cognitive Status: Within Functional Limits for tasks assessed                       Extremity/Trunk Assessment               Exercises     Shoulder Instructions       General Comments      Pertinent Vitals/ Pain       No complaint of  Home Living  Prior Functioning/Environment              Frequency Min 2X/week     Progress Toward Goals  OT Goals(current goals can now be found in the care plan section)  Progress towards OT goals: Progressing toward goals     Plan Discharge plan needs to be updated    Co-evaluation                 End of Session     Activity Tolerance Patient tolerated treatment well   Patient Left in bed;with call bell/phone within reach   Nurse Communication          Time: [id number] OT Time Calculation (min): 27 min  Charges: OT General Charges $OT Visit: 1 Procedure OT Treatments $Self Care/Home Management : 8-22 mins $Therapeutic Activity: 8-22 mins  Sabino GasserStephanie Stafford Presly Steinruck [phone] 11/16/2013, 9:16 AM

## 2013-11-17 ENCOUNTER — Other Ambulatory Visit: Payer: Self-pay | Admitting: Internal Medicine

## 2013-11-17 DIAGNOSIS — D649 Anemia, unspecified: Secondary | ICD-10-CM | POA: Diagnosis present

## 2013-11-17 LAB — COMPREHENSIVE METABOLIC PANEL
ALK PHOS: 630 U/L — AB (ref 39–117)
ALT: 34 U/L (ref 0–53)
AST: 49 U/L — ABNORMAL HIGH (ref 0–37)
Albumin: 2.1 g/dL — ABNORMAL LOW (ref 3.5–5.2)
BILIRUBIN TOTAL: 3.6 mg/dL — AB (ref 0.3–1.2)
BUN: 43 mg/dL — ABNORMAL HIGH (ref 6–23)
CHLORIDE: 100 meq/L (ref 96–112)
CO2: 30 mEq/L (ref 19–32)
Calcium: 8.6 mg/dL (ref 8.4–10.5)
Creatinine, Ser: 1.46 mg/dL — ABNORMAL HIGH (ref 0.50–1.35)
GFR calc Af Amer: 48 mL/min — ABNORMAL LOW (ref >90)
GFR calc non Af Amer: 41 mL/min — ABNORMAL LOW (ref >90)
Glucose, Bld: 108 mg/dL — ABNORMAL HIGH (ref 70–99)
Potassium: 4.3 mEq/L (ref 3.7–5.3)
Sodium: 140 mEq/L (ref 137–147)
Total Protein: 5.6 g/dL — ABNORMAL LOW (ref 6.0–8.3)

## 2013-11-17 LAB — GLUCOSE, CAPILLARY
Glucose-Capillary: 136 mg/dL — ABNORMAL HIGH (ref 70–99)
Glucose-Capillary: 92 mg/dL (ref 70–99)

## 2013-11-17 LAB — LIPASE, BLOOD: Lipase: 961 U/L — ABNORMAL HIGH (ref 11–59)

## 2013-11-17 LAB — AMYLASE: Amylase: 222 U/L — ABNORMAL HIGH (ref 0–105)

## 2013-11-17 MED ORDER — ACETAMINOPHEN 325 MG PO TABS
650.0000 mg | ORAL_TABLET | Freq: Four times a day (QID) | ORAL | Status: DC | PRN
  Administered 2013-11-17: 650 mg via ORAL
  Filled 2013-11-17: qty 2

## 2013-11-17 MED ORDER — SPIRONOLACTONE 25 MG PO TABS: 12.5000 mg | ORAL_TABLET | Freq: Every day | ORAL | Status: DC

## 2013-11-17 MED ORDER — POLYSACCHARIDE IRON COMPLEX 150 MG PO CAPS: 150.0000 mg | ORAL_CAPSULE | Freq: Every day | ORAL | Status: DC

## 2013-11-17 NOTE — Progress Notes (Signed)
Discharged to home, niece at bedside, d/c instructions and follow up appointments was given to the patient signed by the niece per patients request.  Right PICC line was removed by IV RN, no signs of bleeding prior to discharged.

## 2013-11-17 NOTE — Progress Notes (Signed)
SATURATION QUALIFICATIONS: (This note is used to comply with regulatory documentation for home oxygen)  Patient Saturations on Room Air at Rest = 96%  Patient Saturations on Room Air while Ambulating = 95-96%       Pt's HR elevated to a high of 108 while ambulated however decreased back to baseline by the 10 minute mark at 80bpm. Prior, during and post-ambulation stats never dropped below 95% on room air.

## 2013-11-17 NOTE — Discharge Summary (Addendum)
Physician Discharge Summary  Patient ID: [name] [name] MRN: [id number] DOB/AGE: 1924-03-17 80 y.o.  Admit date: 11/01/2013 Discharge date: 12/14/2013   Discharge Diagnoses:  Principal Problem:   Pancreatitis Active Problems:   Coronary atherosclerosis of unspecified type of vessel, native or graft   Acute on chronic diastolic heart failure   Protein-calorie malnutrition, severe   Other and unspecified hyperlipidemia   Aortic stenosis   CKD (chronic kidney disease) stage 3, GFR 30-59 ml/min   Nausea & vomiting   Anemia   Discharged Condition: good  Hospital Course: 80 YO WM with hx recent pancreatitis. Was seen in our office today and labs were done. Reportedly the numbers were better. At about 4:30 PM he had a bad spell of mid epigastic abd pain with nausea and vomiting. He had 2 large BM's and felt faint. The total time of abd pain was about an hour. He is weak and a big nauseous. He has no SOB or CP, He has been eating lightly for several days  He was admitted to a medical bed with telemetry. He had numerous diagnostic studies during his hospitalization including a CT scan of the abdomen without contrast and a CT scan of the abdomen and pelvis, ultrasound of the abdomen, transthoracic echocardiogram, endoscopic ultrasound exam, and ERCP procedure with biopsy and common bile duct stent placement. He showed evidence of pancreatitis with secondary common bile duct obstruction from pancreatic edema. He had placement of a common bile duct stent on May 13 without complications. He had developed jaundice with nausea that gradually improved after the common bile duct stent was placed. A followup CT scan did show persistent intrahepatic duct dilatation to the left hepatic lobe as well as anasarca, bilateral pleural effusions, ascites, pancreatitis, and concerning right kidney lower pole mass. The echocardiogram showed left ventricular ejection fraction of 60% with moderate aortic stenosis and a peak  gradient of 54 mm mercury, mild mitral regurgitation, moderate left atrial enlargement, and normal appearance of the right ventricle with pacer wire within it. His most recent chest x-ray showed improvement in small bilateral pleural effusions. On the day of discharge he was able t eat a regular diet with no abdominal discomfort or nausea. He has been able to ambulate on the unit short distances with very minimal dyspnea and no hypoxia. He is still somewhat weak and will need outpatient rehabilitation as well as education regarding foods to avoid with recovery from pancreatitis.   Consults: GI  Significant Diagnostic Studies:  No results found.  Labs: Lab Results  Component Value Date   WBC 8.0 12/06/2013   HGB 10.3* 12/06/2013   HCT 30.9* 12/06/2013   MCV 103.0* 12/06/2013   PLT 197 12/06/2013    No results found for this basename: NA, K, CL, CO2, BUN, CREATININE, CALCIUM, LABALBU, PROT, BILITOT, ALKPHOS, ALT, AST, GLUCOSE,  in the last 168 hours  Other pertinent labs: Serum amylase 222, lipase 961, alkaline phosphatase 630, total protein 5.6, albumin 2.1, prealbumin 14.1, pro BNP level was 887, total bilirubin 3.6.   Lab Results  Component Value Date   INR 1.06 01/15/2013   INR 0.96 08/14/2011   INR 1.0 08/18/2007     No results found for this or any previous visit (from the past 240 hour(s)).    Discharge Exam: Blood pressure 124/54, pulse 83, temperature 98.5 F (36.9 C), temperature source Oral, resp. rate 18, height 5\' 8"  (1.727 m), weight 89.9 kg (198 lb 3.1 oz), SpO2 97.00%.  Physical Exam: In  general, he is a mildly overweight white man who was in no apparent distress while sitting partially upright in bed. HEENT exam was significant for very light scleral icterus, neck was supple without jugular venous distention or carotid bruit, chest was clear to auscultation, heart had a regular rate and rhythm with a systolic ejection murmur grade 2 of 6 the left sternal border, abdomen had mild  distention with normal bowel sounds and no tenderness, he had 1+ edema of both upper extremities and both lower extremities. He was alert and well oriented with normal affect and could move all extremities well.  Disposition:  He'll be discharged to home today in the company of his daughter. We will arrange for home health nursing and physical therapy to improve his overall condition.      Discharge Instructions   Call MD for:    Complete by:  As directed   Call for fever, chills, nausea, abdominal pain, difficulty breathing, or any other concerning problems     Diet - low sodium heart healthy    Complete by:  As directed   Avoid high fat meals for now, such as steak or pizza, so as to not aggravate the pancreas.     Discharge instructions    Complete by:  As directed   You will be discharged to home today. Check body weights every morning and call Dr. Eloise HarmanPaterson if weight increases by 3 pounds from todays weight. Can remove bandage from right arm tomorrow and wash with soap and water.     Face-to-face encounter (required for Medicare/Medicaid patients)    Complete by:  As directed   I Jarome Matinaniel Holiday Mcmenamin certify that this patient is under my care and that I, or a nurse practitioner or physician's assistant working with me, had a face-to-face encounter that meets the physician face-to-face encounter requirements with this patient on 11/17/2013. The encounter with the patient was in whole, or in part for the following medical condition(s) which is the primary reason for home health care (List medical condition): pancreatitis, protein calorie malnutrition, CHF, diabetes mellitus, type 2, physical deconditioning  The encounter with the patient was in whole, or in part, for the following medical condition, which is the primary reason for home health care:  protein calorie malnutrition, pancreatitis, CHF, physical deconditioning  I certify that, based on my findings, the following services are medically  necessary home health services:   Nursing Physical therapy    My clinical findings support the need for the above services:  Shortness of breath with activity  Further, I certify that my clinical findings support that this patient is homebound due to:  Ambulates short distances less than 300 feet  Reason for Medically Necessary Home Health Services:  Therapy- Therapeutic Exercises to Increase Strength and Endurance     Home Health    Complete by:  As directed   To provide the following care/treatments:   PT RN       Increase activity slowly    Complete by:  As directed             Medication List    STOP taking these medications       losartan 25 MG tablet  Commonly known as:  COZAAR      TAKE these medications       allopurinol 300 MG tablet  Commonly known as:  ZYLOPRIM  Take 300 mg by mouth daily.     aspirin 81 MG tablet  Take 81 mg by  mouth every morning.     atorvastatin 80 MG tablet  Commonly known as:  LIPITOR  Take 40 mg by mouth at bedtime.     cholecalciferol 1000 UNITS tablet  Commonly known as:  VITAMIN D  Take 1,000 Units by mouth daily.     finasteride 5 MG tablet  Commonly known as:  PROSCAR  Take 5 mg by mouth daily.     furosemide 40 MG tablet  Commonly known as:  LASIX  Take 40 mg by mouth daily.     iron polysaccharides 150 MG capsule  Commonly known as:  NIFEREX  Take 1 capsule (150 mg total) by mouth daily.     metoprolol 50 MG tablet  Commonly known as:  LOPRESSOR  Take 25 mg by mouth 2 (two) times daily.     OCUVITE EXTRA Tabs  Take 1 tablet by mouth 2 (two) times daily.     pantoprazole 40 MG tablet  Commonly known as:  PROTONIX  Take 40 mg by mouth 2 (two) times daily.     polyethylene glycol powder powder  Commonly known as:  GLYCOLAX/MIRALAX  Take 17 g by mouth daily as needed for mild constipation (for constipation).     sertraline 100 MG tablet  Commonly known as:  ZOLOFT  Take 100 mg by mouth every morning.      spironolactone 25 MG tablet  Commonly known as:  ALDACTONE  Take 0.5 tablets (12.5 mg total) by mouth daily.       Follow-up Information   Follow up with [name], MD. Schedule an appointment as soon as possible for a visit in 1 week.   Specialty:  Internal Medicine   Contact information:   [address] [place] [address] [phone]       Follow up with Freddy Jaksch, MD. Schedule an appointment as soon as possible for a visit in 2 weeks.   Specialty:  Gastroenterology   Contact information:   [address], [address] [place] [address] [phone]       Signed: [name] 12/14/2013, 2:58 PM

## 2013-11-17 NOTE — Progress Notes (Signed)
Pt setup with [facility], referral given to in house rep.

## 2013-11-24 ENCOUNTER — Encounter: Payer: Self-pay | Admitting: Internal Medicine

## 2013-11-24 ENCOUNTER — Ambulatory Visit (INDEPENDENT_AMBULATORY_CARE_PROVIDER_SITE_OTHER): Payer: Medicare Other | Admitting: *Deleted

## 2013-11-24 DIAGNOSIS — I442 Atrioventricular block, complete: Secondary | ICD-10-CM

## 2013-11-24 NOTE — Progress Notes (Signed)
Remote pacemaker transmission.   

## 2013-12-02 LAB — IGG 1, 2, 3, AND 4
IGG, SUBCLASS 2: 309
IgG 4: 16
IgG, Subclass 1: 348
IgG, Subclass 3: 119

## 2013-12-02 LAB — IGG: IgG (Immunoglobin G), Serum: 829 mg/dL (ref 694–1618)

## 2013-12-06 ENCOUNTER — Encounter (HOSPITAL_COMMUNITY): Payer: Self-pay | Admitting: Emergency Medicine

## 2013-12-06 ENCOUNTER — Emergency Department (HOSPITAL_COMMUNITY): Payer: Medicare Other

## 2013-12-06 ENCOUNTER — Emergency Department (HOSPITAL_COMMUNITY)
Admission: EM | Admit: 2013-12-06 | Discharge: 2013-12-06 | Disposition: A | Discharge status: Home / Self Care | Payer: Medicare Other | Attending: Emergency Medicine | Admitting: Emergency Medicine

## 2013-12-06 DIAGNOSIS — Z8673 Personal history of transient ischemic attack (TIA), and cerebral infarction without residual deficits: Secondary | ICD-10-CM | POA: Insufficient documentation

## 2013-12-06 DIAGNOSIS — I509 Heart failure, unspecified: Secondary | ICD-10-CM | POA: Insufficient documentation

## 2013-12-06 DIAGNOSIS — Z950 Presence of cardiac pacemaker: Secondary | ICD-10-CM | POA: Insufficient documentation

## 2013-12-06 DIAGNOSIS — F3289 Other specified depressive episodes: Secondary | ICD-10-CM | POA: Insufficient documentation

## 2013-12-06 DIAGNOSIS — I10 Essential (primary) hypertension: Secondary | ICD-10-CM | POA: Insufficient documentation

## 2013-12-06 DIAGNOSIS — R748 Abnormal levels of other serum enzymes: Secondary | ICD-10-CM

## 2013-12-06 DIAGNOSIS — R109 Unspecified abdominal pain: Secondary | ICD-10-CM | POA: Insufficient documentation

## 2013-12-06 DIAGNOSIS — M1990 Unspecified osteoarthritis, unspecified site: Secondary | ICD-10-CM | POA: Insufficient documentation

## 2013-12-06 DIAGNOSIS — M109 Gout, unspecified: Secondary | ICD-10-CM | POA: Insufficient documentation

## 2013-12-06 DIAGNOSIS — Z8669 Personal history of other diseases of the nervous system and sense organs: Secondary | ICD-10-CM | POA: Insufficient documentation

## 2013-12-06 DIAGNOSIS — E860 Dehydration: Secondary | ICD-10-CM | POA: Insufficient documentation

## 2013-12-06 DIAGNOSIS — IMO0002 Reserved for concepts with insufficient information to code with codable children: Secondary | ICD-10-CM | POA: Insufficient documentation

## 2013-12-06 DIAGNOSIS — Z87442 Personal history of urinary calculi: Secondary | ICD-10-CM | POA: Insufficient documentation

## 2013-12-06 DIAGNOSIS — I2510 Atherosclerotic heart disease of native coronary artery without angina pectoris: Secondary | ICD-10-CM | POA: Insufficient documentation

## 2013-12-06 DIAGNOSIS — E785 Hyperlipidemia, unspecified: Secondary | ICD-10-CM | POA: Insufficient documentation

## 2013-12-06 DIAGNOSIS — R011 Cardiac murmur, unspecified: Secondary | ICD-10-CM | POA: Insufficient documentation

## 2013-12-06 DIAGNOSIS — Z79899 Other long term (current) drug therapy: Secondary | ICD-10-CM | POA: Insufficient documentation

## 2013-12-06 DIAGNOSIS — F329 Major depressive disorder, single episode, unspecified: Secondary | ICD-10-CM | POA: Insufficient documentation

## 2013-12-06 DIAGNOSIS — K219 Gastro-esophageal reflux disease without esophagitis: Secondary | ICD-10-CM | POA: Insufficient documentation

## 2013-12-06 DIAGNOSIS — Z87891 Personal history of nicotine dependence: Secondary | ICD-10-CM | POA: Insufficient documentation

## 2013-12-06 DIAGNOSIS — N400 Enlarged prostate without lower urinary tract symptoms: Secondary | ICD-10-CM | POA: Insufficient documentation

## 2013-12-06 DIAGNOSIS — Z7982 Long term (current) use of aspirin: Secondary | ICD-10-CM | POA: Insufficient documentation

## 2013-12-06 HISTORY — DX: Acute pancreatitis without necrosis or infection, unspecified: K85.90

## 2013-12-06 LAB — I-STAT TROPONIN, ED: TROPONIN I, POC: 0.03 ng/mL (ref 0.00–0.08)

## 2013-12-06 LAB — CBC WITH DIFFERENTIAL/PLATELET
BASOS PCT: 0 % (ref 0–1)
Basophils Absolute: 0 10*3/uL (ref 0.0–0.1)
EOS ABS: 0 10*3/uL (ref 0.0–0.7)
EOS PCT: 0 % (ref 0–5)
HCT: 30.9 % — ABNORMAL LOW (ref 39.0–52.0)
Hemoglobin: 10.3 g/dL — ABNORMAL LOW (ref 13.0–17.0)
LYMPHS ABS: 1 10*3/uL (ref 0.7–4.0)
Lymphocytes Relative: 12 % (ref 12–46)
MCH: 34.3 pg — AB (ref 26.0–34.0)
MCHC: 33.3 g/dL (ref 30.0–36.0)
MCV: 103 fL — AB (ref 78.0–100.0)
MONOS PCT: 9 % (ref 3–12)
Monocytes Absolute: 0.7 10*3/uL (ref 0.1–1.0)
NEUTROS PCT: 79 % — AB (ref 43–77)
Neutro Abs: 6.3 10*3/uL (ref 1.7–7.7)
Platelets: 197 10*3/uL (ref 150–400)
RBC: 3 MIL/uL — AB (ref 4.22–5.81)
RDW: 17.2 % — ABNORMAL HIGH (ref 11.5–15.5)
WBC: 8 10*3/uL (ref 4.0–10.5)

## 2013-12-06 LAB — MDC_IDC_ENUM_SESS_TYPE_REMOTE
Battery Remaining Longevity: 21 mo
Battery Voltage: 2.71 V
Brady Statistic AP VP Percent: 71 %
Brady Statistic AP VS Percent: 0 %
Brady Statistic AS VP Percent: 29 %
Lead Channel Impedance Value: 443 Ohm
Lead Channel Pacing Threshold Amplitude: 0.5 V
Lead Channel Pacing Threshold Amplitude: 0.625 V
Lead Channel Sensing Intrinsic Amplitude: 2.8 mV
Lead Channel Setting Pacing Pulse Width: 0.4 ms
Lead Channel Setting Sensing Sensitivity: 5.6 mV
MDC IDC MSMT BATTERY IMPEDANCE: 2106 Ohm
MDC IDC MSMT LEADCHNL RA IMPEDANCE VALUE: 418 Ohm
MDC IDC MSMT LEADCHNL RA PACING THRESHOLD PULSEWIDTH: 0.4 ms
MDC IDC MSMT LEADCHNL RV PACING THRESHOLD PULSEWIDTH: 0.4 ms
MDC IDC SESS DTM: 20150527170804
MDC IDC SET LEADCHNL RA PACING AMPLITUDE: 2 V
MDC IDC SET LEADCHNL RV PACING AMPLITUDE: 2.5 V
MDC IDC STAT BRADY AS VS PERCENT: 0 %

## 2013-12-06 LAB — BASIC METABOLIC PANEL
BUN: 49 mg/dL — AB (ref 6–23)
CALCIUM: 9.3 mg/dL (ref 8.4–10.5)
CO2: 30 mEq/L (ref 19–32)
Chloride: 98 mEq/L (ref 96–112)
Creatinine, Ser: 1.68 mg/dL — ABNORMAL HIGH (ref 0.50–1.35)
GFR calc Af Amer: 41 mL/min — ABNORMAL LOW (ref >90)
GFR, EST NON AFRICAN AMERICAN: 35 mL/min — AB (ref >90)
Glucose, Bld: 183 mg/dL — ABNORMAL HIGH (ref 70–99)
POTASSIUM: 3.9 meq/L (ref 3.7–5.3)
Sodium: 140 mEq/L (ref 137–147)

## 2013-12-06 LAB — PRO B NATRIURETIC PEPTIDE: Pro B Natriuretic peptide (BNP): 736.3 pg/mL — ABNORMAL HIGH (ref 0–450)

## 2013-12-06 LAB — LIPASE, BLOOD: Lipase: 1240 U/L — ABNORMAL HIGH (ref 11–59)

## 2013-12-06 LAB — HEPATIC FUNCTION PANEL
ALT: 106 U/L — ABNORMAL HIGH (ref 0–53)
AST: 177 U/L — ABNORMAL HIGH (ref 0–37)
Albumin: 3 g/dL — ABNORMAL LOW (ref 3.5–5.2)
Alkaline Phosphatase: 563 U/L — ABNORMAL HIGH (ref 39–117)
BILIRUBIN DIRECT: 2.2 mg/dL — AB (ref 0.0–0.3)
Indirect Bilirubin: 0.7 mg/dL (ref 0.3–0.9)
Total Bilirubin: 2.9 mg/dL — ABNORMAL HIGH (ref 0.3–1.2)
Total Protein: 6.5 g/dL (ref 6.0–8.3)

## 2013-12-06 MED ORDER — ONDANSETRON HCL 4 MG/2ML IJ SOLN
4.0000 mg | Freq: Once | INTRAMUSCULAR | Status: AC
  Administered 2013-12-06: 4 mg via INTRAVENOUS
  Filled 2013-12-06: qty 2

## 2013-12-06 MED ORDER — SODIUM CHLORIDE 0.9 % IV BOLUS (SEPSIS)
500.0000 mL | Freq: Once | INTRAVENOUS | Status: AC
  Administered 2013-12-06: 500 mL via INTRAVENOUS

## 2013-12-06 MED ORDER — HYDROMORPHONE HCL PF 1 MG/ML IJ SOLN: 0.5000 mg | Freq: Once | INTRAMUSCULAR | Status: DC

## 2013-12-06 MED ORDER — PREDNISONE 20 MG PO TABS: 20.0000 mg | ORAL_TABLET | Freq: Every day | ORAL | Status: DC

## 2013-12-06 MED ORDER — MORPHINE SULFATE 2 MG/ML IJ SOLN
2.0000 mg | Freq: Once | INTRAMUSCULAR | Status: AC
Start: 2013-12-06 — End: 2013-12-06
  Administered 2013-12-06: 2 mg via INTRAVENOUS
  Filled 2013-12-06: qty 1

## 2013-12-06 MED ORDER — PREDNISONE 20 MG PO TABS
20.0000 mg | ORAL_TABLET | Freq: Once | ORAL | Status: AC
  Administered 2013-12-06: 20 mg via ORAL
  Filled 2013-12-06: qty 1

## 2013-12-06 MED ORDER — TRAMADOL HCL 50 MG PO TABS: 50.0000 mg | ORAL_TABLET | Freq: Four times a day (QID) | ORAL | Status: DC | PRN

## 2013-12-06 MED ORDER — SODIUM CHLORIDE 0.9 % IV SOLN: Freq: Once | INTRAVENOUS | Status: DC

## 2013-12-06 MED ORDER — ONDANSETRON HCL 4 MG PO TABS: 4.0000 mg | ORAL_TABLET | Freq: Four times a day (QID) | ORAL | Status: DC | PRN

## 2013-12-06 NOTE — ED Notes (Signed)
Bed: WA04 Expected date:  Expected time:  Means of arrival:  Comments: EMS-abdominal pain 

## 2013-12-06 NOTE — ED Provider Notes (Signed)
  Face-to-face evaluation   History: He has had persistent, early morning nausea, and abdominal pain , for 3 weeks. Today, the pain and nausea were worse, so he called his daughter. When she got to his home, he complained of weakness. She checked his blood pressure, and it was 96, systolic. She called EMS, who brought him here. He reports that since being treated him. He feels back to baseline. He does not have persistent nausea and abdominal pain, throughout the day.  Physical exam: Alert, elderly man in mild discomfort. Heart regular rate and rhythm. No murmur. Lungs clear. Oral mucosa are moist. Abdomen soft, nontender. Bowel sounds are hypoactive.  10:40- I discussed the case with his PCP, Dr. [name]. He, states that the patient is being treated with prednisone for inflammation of the pancreas. He is taking Reglan, for nausea. He would like the patient to continue on prednisone, for now. We discussed additional treatment of nausea with Zofran, and tramadol for pain. Dr. [name] will followup with him in the office. The patient has an implant, with GI, in 4 days  Medical screening examination/treatment/procedure(s) were conducted as a shared visit with non-physician practitioner(s) and myself.  I personally evaluated the patient during the encounter   Flint Melter, MD 12/07/13 1549

## 2013-12-06 NOTE — ED Provider Notes (Signed)
CSN: [id number]     Arrival date & time 12/06/13  4098 History   First MD Initiated Contact with Patient 12/06/13 [phone]     Chief Complaint  Patient presents with  . Abdominal Pain     (Consider location/radiation/quality/duration/timing/severity/associated sxs/prior Treatment) HPI  [name] [name] is a 80 y.o. male with past medical history significant for CHF, status post pacemaker, several episodes of pancreatitis of secondary to biliary stricture (s/p stent), chronic renal disease and CVA complaining of acute onset of epigastric abdominal pain that woke him from sleep at 5 AM associated with nausea and no vomiting. States this feels like prior pancreatitis episodes, except now the pain is in the bilateral lower quadrants, this lower pain is atypical. Patient denies melena, hematochezia, diarrhea. Pain at onset was 10 out of 10, now it is 5/10, no pain medication resolved spontaneously. Patient is his chest pain, shortness of breath increasing peripheral edema, palpitations. Patient does endorse a presyncopal sensation. He says that he checked his blood pressure at home and it was 96/38, normal by time EMS evaluated him.   Past Medical History  Diagnosis Date  . Osteoarthritis   . Gout   . Hypertension   . History of coronary artery disease   . GERD (gastroesophageal reflux disease)   . CHF (congestive heart failure)   . Diastolic dysfunction   . History of GI bleed   . History of pacemaker     Medtronic Adapta ADDro1  . Peptic ulcer disease   . BPH (benign prostatic hypertrophy)   . 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 Hyperlipidemia   . History of cerebrovascular disease   . Kidney stone   . Depression   . Chest pain   . Pacemaker   . Pancreatitis    Past Surgical History  Procedure Laterality Date  . Pacemaker insertion      Medtronic Adapta ADDro1  . Cholecystectomy    . Total knee arthroplasty    . Hernia repair    . Tonsillectomy    . Total knee  arthroplasty  08/28/2011    Procedure: TOTAL KNEE ARTHROPLASTY;  Surgeon: Drucilla Schmidt, MD;  Location: [facility];  Service: Orthopedics;  Laterality: Left;  . Eus N/A 11/04/2013    Procedure: UPPER ENDOSCOPIC ULTRASOUND (EUS) LINEAR;  Surgeon: [name], MD;  Location: [facility];  Service: Endoscopy;  Laterality: N/A;  . Ercp N/A 11/10/2013    Procedure: ENDOSCOPIC RETROGRADE CHOLANGIOPANCREATOGRAPHY (ERCP);  Surgeon: Barrie Folk, MD;  Location: [facility];  Service: Endoscopy;  Laterality: N/A;  . Biliary stent placement N/A 11/10/2013    Procedure: BILIARY STENT PLACEMENT;  Surgeon: Barrie Folk, MD;  Location: [facility];  Service: Endoscopy;  Laterality: N/A;   Family History  Problem Relation Age of Onset  . Diabetes Mother   . Leukemia Father    History  Substance Use Topics  . Smoking status: Former Games developer  . Smokeless tobacco: Former Neurosurgeon    Quit date: 08/13/1976  . Alcohol Use: No    Review of Systems  10 systems reviewed and found to be negative, except as noted in the HPI.    Allergies  Review of patient's allergies indicates no known allergies.  Home Medications   Prior to Admission medications   Medication Sig Start Date End Date Taking? Authorizing Provider  allopurinol (ZYLOPRIM) 300 MG tablet Take 300 mg by mouth daily.    Yes Historical Provider, MD  aspirin 81 MG  tablet Take 81 mg by mouth every morning.    Yes Historical Provider, MD  atorvastatin (LIPITOR) 80 MG tablet Take 40 mg by mouth at bedtime.    Yes Historical Provider, MD  cholecalciferol (VITAMIN D) 1000 UNITS tablet Take 1,000 Units by mouth daily.   Yes Historical Provider, MD  finasteride (PROSCAR) 5 MG tablet Take 5 mg by mouth daily.    Yes Historical Provider, MD  furosemide (LASIX) 40 MG tablet Take 40 mg by mouth daily.   Yes Historical Provider, MD  furosemide (LASIX) 40 MG tablet Take 40 mg by mouth daily.   Yes Historical Provider, MD  iron polysaccharides (NIFEREX) 150 MG  capsule Take 1 capsule (150 mg total) by mouth daily. 11/17/13  Yes Jarome Matin, MD  metoCLOPramide (REGLAN) 5 MG tablet Take 5 mg by mouth 2 (two) times daily.   Yes Historical Provider, MD  metoprolol (LOPRESSOR) 50 MG tablet Take 25 mg by mouth 2 (two) times daily.   Yes Historical Provider, MD  Multiple Vitamins-Minerals (OCUVITE EXTRA) TABS Take 1 tablet by mouth 2 (two) times daily.    Yes Historical Provider, MD  pantoprazole (PROTONIX) 40 MG tablet Take 40 mg by mouth 2 (two) times daily.    Yes Historical Provider, MD  polyethylene glycol powder (GLYCOLAX/MIRALAX) powder Take 17 g by mouth daily as needed for mild constipation (for constipation).    Yes Historical Provider, MD  sertraline (ZOLOFT) 100 MG tablet Take 100 mg by mouth every morning.   Yes Historical Provider, MD  spironolactone (ALDACTONE) 25 MG tablet Take 0.5 tablets (12.5 mg total) by mouth daily. 11/17/13  Yes Jarome Matin, MD  ondansetron (ZOFRAN) 4 MG tablet Take 1 tablet (4 mg total) by mouth every 6 (six) hours as needed for nausea or vomiting. 12/06/13   Floy Angert, PA-C  predniSONE (DELTASONE) 20 MG tablet Take 1 tablet (20 mg total) by mouth daily. 12/06/13   Divante Kotch, PA-C  traMADol (ULTRAM) 50 MG tablet Take 1 tablet (50 mg total) by mouth every 6 (six) hours as needed. 12/06/13   Rayshun Kandler, PA-C   BP 125/53  Pulse 70  Temp(Src) 97.4 F (36.3 C) (Rectal)  Resp 19  SpO2 95% Physical Exam  Nursing note and vitals reviewed. Constitutional: He is oriented to person, place, and time. He appears well-developed and well-nourished. No distress.  HENT:  Head: Normocephalic and atraumatic.  Mouth/Throat: Oropharynx is clear and moist.  Eyes: Conjunctivae and EOM are normal. Pupils are equal, round, and reactive to light.  Neck: Normal range of motion.  Cardiovascular: Normal rate, regular rhythm and intact distal pulses.   Murmur heard.  Systolic murmur is present  Pulmonary/Chest: Effort  normal and breath sounds normal. No stridor. No respiratory distress. He has no wheezes. He has no rales. He exhibits no tenderness.  Abdominal: Soft. Bowel sounds are normal. He exhibits no distension and no mass. There is tenderness. There is no rebound and no guarding.  Slight distention, normal active bowel sounds, diffusely tender to palpation. Especially in the epigastrium and bilateral lower quadrants, no guarding or rebound.  Musculoskeletal: Normal range of motion. He exhibits no edema.  Neurological: He is alert and oriented to person, place, and time.  Skin: Skin is warm.  Psychiatric: He has a normal mood and affect.    ED Course  Procedures (including critical care time) Labs Review Labs Reviewed  CBC WITH DIFFERENTIAL - Abnormal; Notable for the following:    RBC 3.00 (*)  Hemoglobin 10.3 (*)    HCT 30.9 (*)    MCV 103.0 (*)    MCH 34.3 (*)    RDW 17.2 (*)    Neutrophils Relative % 79 (*)    All other components within normal limits  BASIC METABOLIC PANEL - Abnormal; Notable for the following:    Glucose, Bld 183 (*)    BUN 49 (*)    Creatinine, Ser 1.68 (*)    GFR calc non Af Amer 35 (*)    GFR calc Af Amer 41 (*)    All other components within normal limits  HEPATIC FUNCTION PANEL - Abnormal; Notable for the following:    Albumin 3.0 (*)    AST 177 (*)    ALT 106 (*)    Alkaline Phosphatase 563 (*)    Total Bilirubin 2.9 (*)    Bilirubin, Direct 2.2 (*)    All other components within normal limits  LIPASE, BLOOD - Abnormal; Notable for the following:    Lipase 1240 (*)    All other components within normal limits  PRO B NATRIURETIC PEPTIDE - Abnormal; Notable for the following:    Pro B Natriuretic peptide (BNP) 736.3 (*)    All other components within normal limits  I-STAT TROPOININ, ED    Imaging Review Dg Chest 2 View  12/06/2013   CLINICAL DATA:  Hypertension  EXAM: CHEST  2 VIEW  COMPARISON:  11/14/2013  FINDINGS: Cardiac shadow is stable. A  pacing device is again seen. The lungs are again well aerated without focal infiltrate or sizable effusion. No acute bony abnormality is seen.  IMPRESSION: No active cardiopulmonary disease.   Electronically Signed   By: Alcide CleverMark  Lukens M.D.   On: 12/06/2013 09:21     EKG Interpretation   Date/Time:  Monday December 06 2013 08:48:59 EDT Ventricular Rate:  67 PR Interval:  309 QRS Duration: 151 QT Interval:  447 QTC Calculation: 472 R Axis:   0 Text Interpretation:  Atrial-sensed ventricular-paced complexes No further  analysis attempted due to paced rhythm Baseline wander in lead(s) III aVF  Confirmed by HORTON  MD, COURTNEY ([id number]) on 12/06/2013 9:24:18 AM      MDM   Final diagnoses:  Abdominal pain  Elevated lipase  Dehydration    Filed Vitals:   12/06/13 0823 12/06/13 0827 12/06/13 1056 12/06/13 1106  BP:  128/50  125/53  Pulse:  69  70  Temp:  97.6 F (36.4 C) 97.4 F (36.3 C)   TempSrc:  Oral Rectal   Resp:  16  19  SpO2: 96% 98%  95%    Medications  ondansetron (ZOFRAN) injection 4 mg (4 mg Intravenous Given 12/06/13 0923)  morphine 2 MG/ML injection 2 mg (2 mg Intravenous Given 12/06/13 0922)  sodium chloride 0.9 % bolus 500 mL (0 mLs Intravenous Stopped 12/06/13 1150)  predniSONE (DELTASONE) tablet 20 mg (20 mg Oral Given 12/06/13 1105)    Pietro Cassishomas E Silvers is a 80 y.o. male presenting with onset of epigastric pain associated with nausea presyncopal sensation. Patient has extensive cardiac history, EKG nonischemic, BNP is at baseline troponin is negative. Abdominal exam is nonsurgical. Lipase is mildly elevated at 1240. Was 900 at discharge.   GI consult from [facility] appreciated: No emergent intervention indicated at this time.  Attending physician has spoken to patient's primary care, states that patient should be on prednisone 20 mg daily for nonspecific lipase elevation, recommend switching from Reglan to Zofran. Patient will be followed as an  outpatient her primary  care. This is a shared visit with the attending physician who personally evaluated the patient and agrees with the care plan.   Evaluation does not show pathology that would require ongoing emergent intervention or inpatient treatment. Pt is hemodynamically stable and mentating appropriately. Discussed findings and plan with patient/guardian, who agrees with care plan. All questions answered. Return precautions discussed and outpatient follow up given.   Discharge Medication List as of 12/06/2013 11:35 AM    START taking these medications   Details  ondansetron (ZOFRAN) 4 MG tablet Take 1 tablet (4 mg total) by mouth every 6 (six) hours as needed for nausea or vomiting., Starting 12/06/2013, Until Discontinued, Print    predniSONE (DELTASONE) 20 MG tablet Take 1 tablet (20 mg total) by mouth daily., Starting 12/06/2013, Until Discontinued, Print    traMADol (ULTRAM) 50 MG tablet Take 1 tablet (50 mg total) by mouth every 6 (six) hours as needed., Starting 12/06/2013, Until Discontinued, [employer], PA-C 12/06/13 [phone]

## 2013-12-06 NOTE — ED Notes (Signed)
Per EMS. Pt from home. Reports centralized abd pain upon awakening at 5am today. Reports nausea but no vomiting or diarrhea. LBM yesterday. Pt had am meds and cereal for breakfast this am. Abd swollen, bowel sounds audible. Pt reports being hospitalized for pancreatitis last week.

## 2013-12-06 NOTE — ED Notes (Signed)
MD at bedside. 

## 2013-12-06 NOTE — Discharge Instructions (Signed)
For pain control you may takeTramadol. Do not drink alcohol drive or operate heavy machinery when taking Tramadol.  Please follow with your primary care doctor in the next 2 days for a check-up. They must obtain records for further management.   Do not hesitate to return to the Emergency Department for any new, worsening or concerning symptoms.

## 2013-12-06 NOTE — ED Notes (Signed)
Patient transported to X-ray 

## 2013-12-21 ENCOUNTER — Other Ambulatory Visit: Payer: Self-pay | Admitting: Internal Medicine

## 2013-12-23 ENCOUNTER — Other Ambulatory Visit: Payer: Self-pay | Admitting: Gastroenterology

## 2013-12-23 DIAGNOSIS — K831 Obstruction of bile duct: Secondary | ICD-10-CM

## 2013-12-23 DIAGNOSIS — R17 Unspecified jaundice: Secondary | ICD-10-CM

## 2013-12-23 DIAGNOSIS — K859 Acute pancreatitis, unspecified: Secondary | ICD-10-CM

## 2013-12-27 ENCOUNTER — Encounter (HOSPITAL_COMMUNITY): Payer: Self-pay | Admitting: *Deleted

## 2013-12-27 ENCOUNTER — Other Ambulatory Visit: Payer: Self-pay | Admitting: Gastroenterology

## 2013-12-27 NOTE — Progress Notes (Signed)
Patient states at time of phone interview that he had not stopped Aspirin . Informed patient to call Dr. Malena Edmanutlaws office and ask them what medications he needs to stop. Patient verbalized understanding.

## 2013-12-27 NOTE — Addendum Note (Signed)
Addended by: OUTLAW, WILLIAM on: 12/27/2013 05:19 PM   Modules accepted: Orders  

## 2013-12-28 ENCOUNTER — Ambulatory Visit
Admission: RE | Admit: 2013-12-28 | Discharge: 2013-12-28 | Disposition: A | Discharge status: Home / Self Care | Payer: Medicare Other | Source: Ambulatory Visit | Attending: Gastroenterology | Admitting: Gastroenterology

## 2013-12-28 DIAGNOSIS — R17 Unspecified jaundice: Secondary | ICD-10-CM

## 2013-12-28 DIAGNOSIS — K859 Acute pancreatitis, unspecified: Secondary | ICD-10-CM

## 2013-12-28 DIAGNOSIS — K831 Obstruction of bile duct: Secondary | ICD-10-CM

## 2013-12-29 ENCOUNTER — Encounter: Payer: Self-pay | Admitting: Cardiology

## 2013-12-29 ENCOUNTER — Encounter (HOSPITAL_COMMUNITY): Payer: Self-pay | Admitting: *Deleted

## 2013-12-29 ENCOUNTER — Ambulatory Visit (HOSPITAL_COMMUNITY): Payer: Medicare Other

## 2013-12-29 ENCOUNTER — Encounter (HOSPITAL_COMMUNITY): Payer: Medicare Other | Admitting: Anesthesiology

## 2013-12-29 ENCOUNTER — Ambulatory Visit (HOSPITAL_COMMUNITY)
Admission: RE | Admit: 2013-12-29 | Discharge: 2013-12-29 | Disposition: A | Discharge status: Home / Self Care | Payer: Medicare Other | Source: Ambulatory Visit | Attending: Gastroenterology | Admitting: Gastroenterology

## 2013-12-29 ENCOUNTER — Encounter (HOSPITAL_COMMUNITY)
Admission: RE | Disposition: A | Discharge status: Home / Self Care | Payer: Self-pay | Source: Ambulatory Visit | Attending: Gastroenterology

## 2013-12-29 ENCOUNTER — Ambulatory Visit (HOSPITAL_COMMUNITY): Payer: Medicare Other | Admitting: Anesthesiology

## 2013-12-29 DIAGNOSIS — K279 Peptic ulcer, site unspecified, unspecified as acute or chronic, without hemorrhage or perforation: Secondary | ICD-10-CM | POA: Insufficient documentation

## 2013-12-29 DIAGNOSIS — K838 Other specified diseases of biliary tract: Secondary | ICD-10-CM | POA: Insufficient documentation

## 2013-12-29 DIAGNOSIS — F3289 Other specified depressive episodes: Secondary | ICD-10-CM | POA: Insufficient documentation

## 2013-12-29 DIAGNOSIS — I129 Hypertensive chronic kidney disease with stage 1 through stage 4 chronic kidney disease, or unspecified chronic kidney disease: Secondary | ICD-10-CM | POA: Insufficient documentation

## 2013-12-29 DIAGNOSIS — Z87891 Personal history of nicotine dependence: Secondary | ICD-10-CM | POA: Insufficient documentation

## 2013-12-29 DIAGNOSIS — I359 Nonrheumatic aortic valve disorder, unspecified: Secondary | ICD-10-CM | POA: Insufficient documentation

## 2013-12-29 DIAGNOSIS — I2510 Atherosclerotic heart disease of native coronary artery without angina pectoris: Secondary | ICD-10-CM | POA: Insufficient documentation

## 2013-12-29 DIAGNOSIS — N189 Chronic kidney disease, unspecified: Secondary | ICD-10-CM | POA: Insufficient documentation

## 2013-12-29 DIAGNOSIS — K219 Gastro-esophageal reflux disease without esophagitis: Secondary | ICD-10-CM | POA: Insufficient documentation

## 2013-12-29 DIAGNOSIS — M60009 Infective myositis, unspecified site: Secondary | ICD-10-CM | POA: Insufficient documentation

## 2013-12-29 DIAGNOSIS — F329 Major depressive disorder, single episode, unspecified: Secondary | ICD-10-CM | POA: Insufficient documentation

## 2013-12-29 DIAGNOSIS — Z79899 Other long term (current) drug therapy: Secondary | ICD-10-CM | POA: Insufficient documentation

## 2013-12-29 DIAGNOSIS — K831 Obstruction of bile duct: Secondary | ICD-10-CM | POA: Insufficient documentation

## 2013-12-29 DIAGNOSIS — Z950 Presence of cardiac pacemaker: Secondary | ICD-10-CM | POA: Insufficient documentation

## 2013-12-29 HISTORY — PX: ERCP: SHX5425

## 2013-12-29 SURGERY — ERCP, WITH INTERVENTION IF INDICATED
Anesthesia: Monitor Anesthesia Care

## 2013-12-29 MED ORDER — PROMETHAZINE HCL 25 MG/ML IJ SOLN
INTRAMUSCULAR | Status: AC
  Filled 2013-12-29: qty 1

## 2013-12-29 MED ORDER — ONDANSETRON HCL 4 MG/2ML IJ SOLN
INTRAMUSCULAR | Status: DC | PRN
  Administered 2013-12-29: 4 mg via INTRAVENOUS

## 2013-12-29 MED ORDER — LIDOCAINE HCL (CARDIAC) 20 MG/ML IV SOLN
INTRAVENOUS | Status: DC | PRN
Start: 2013-12-29 — End: 2013-12-29
  Administered 2013-12-29: 100 mg via INTRAVENOUS

## 2013-12-29 MED ORDER — IOHEXOL 350 MG/ML SOLN
INTRAVENOUS | Status: DC | PRN
  Administered 2013-12-29: 15:00:00

## 2013-12-29 MED ORDER — PHENYLEPHRINE HCL 10 MG/ML IJ SOLN
INTRAMUSCULAR | Status: AC
  Filled 2013-12-29: qty 2

## 2013-12-29 MED ORDER — CIPROFLOXACIN IN D5W 400 MG/200ML IV SOLN
INTRAVENOUS | Status: AC
  Filled 2013-12-29: qty 200

## 2013-12-29 MED ORDER — SUCCINYLCHOLINE CHLORIDE 20 MG/ML IJ SOLN
INTRAMUSCULAR | Status: DC | PRN
  Administered 2013-12-29: 100 mg via INTRAVENOUS

## 2013-12-29 MED ORDER — REMIFENTANIL HCL 1 MG IV SOLR
INTRAVENOUS | Status: DC | PRN
  Administered 2013-12-29: .5 ug/kg/min via INTRAVENOUS

## 2013-12-29 MED ORDER — SODIUM CHLORIDE 0.9 % IV SOLN: INTRAVENOUS | Status: DC

## 2013-12-29 MED ORDER — PROPOFOL 10 MG/ML IV BOLUS
INTRAVENOUS | Status: DC | PRN
  Administered 2013-12-29: 150 mg via INTRAVENOUS

## 2013-12-29 MED ORDER — FENTANYL CITRATE 0.05 MG/ML IJ SOLN: 25.0000 ug | INTRAMUSCULAR | Status: DC | PRN

## 2013-12-29 MED ORDER — REMIFENTANIL HCL 1 MG IV SOLR
INTRAVENOUS | Status: AC
  Filled 2013-12-29: qty 1000

## 2013-12-29 MED ORDER — PROPOFOL 10 MG/ML IV BOLUS
INTRAVENOUS | Status: AC
  Filled 2013-12-29: qty 20

## 2013-12-29 MED ORDER — GLYCOPYRROLATE 0.2 MG/ML IJ SOLN
INTRAMUSCULAR | Status: AC
  Filled 2013-12-29: qty 1

## 2013-12-29 MED ORDER — LIDOCAINE HCL (CARDIAC) 20 MG/ML IV SOLN
INTRAVENOUS | Status: AC
  Filled 2013-12-29: qty 5

## 2013-12-29 MED ORDER — EPHEDRINE SULFATE 50 MG/ML IJ SOLN
INTRAMUSCULAR | Status: DC | PRN
  Administered 2013-12-29: 10 mg via INTRAVENOUS

## 2013-12-29 MED ORDER — ONDANSETRON HCL 4 MG/2ML IJ SOLN
INTRAMUSCULAR | Status: AC
  Filled 2013-12-29: qty 2

## 2013-12-29 MED ORDER — SODIUM CHLORIDE 0.9 % IV SOLN
10.0000 mg | INTRAVENOUS | Status: DC | PRN
  Administered 2013-12-29: 25 ug/min via INTRAVENOUS

## 2013-12-29 MED ORDER — LACTATED RINGERS IV SOLN
INTRAVENOUS | Status: DC
  Administered 2013-12-29: 1000 mL via INTRAVENOUS
  Administered 2013-12-29: 12:00:00 via INTRAVENOUS

## 2013-12-29 MED ORDER — CIPROFLOXACIN IN D5W 400 MG/200ML IV SOLN
400.0000 mg | Freq: Once | INTRAVENOUS | Status: AC
  Administered 2013-12-29: 400 mg via INTRAVENOUS

## 2013-12-29 MED ORDER — PROMETHAZINE HCL 25 MG/ML IJ SOLN
12.5000 mg | INTRAMUSCULAR | Status: DC | PRN
  Administered 2013-12-29: 12.5 mg via INTRAVENOUS

## 2013-12-29 MED ORDER — LACTATED RINGERS IV SOLN: INTRAVENOUS | Status: DC

## 2013-12-29 MED ORDER — ONDANSETRON HCL 4 MG/2ML IJ SOLN
4.0000 mg | Freq: Once | INTRAMUSCULAR | Status: DC
Start: 2013-12-29 — End: 2013-12-29

## 2013-12-29 SURGICAL SUPPLY — 1 items: Fully Covered Metal Biliary Stent ×2 IMPLANT

## 2013-12-29 NOTE — Op Note (Signed)
[facility] [place], [id number]   ERCP PROCEDURE REPORT  PATIENT: [name] [name], [name] [name]  MR# :[id number] BIRTHDATE: Nov 24, 1923  GENDER: Male ENDOSCOPIST: Arta Silence, MD REFERRED BY: Leanna Battles, M.D. PROCEDURE DATE:  12/29/2013 PROCEDURE:   ERCP with stent removal and replacement ASA CLASS:    ASA-III INDICATIONS:  Obstructive jaundice, history of pancreatitis MEDICATIONS:    General endotracheal anesthesia, ciprofloxacin 400 mg IV  DESCRIPTION OF PROCEDURE:   After the risks benefits and alternatives of the procedure were thoroughly explained, informed consent was obtained.  The side-viewing therapeutic  duodendoscope was introduced through the mouth and advanced to the second portion of the duodenum .    FINDINGS:  Extremely friable, congested and edematous duodenal bulb and descending duodenum.  Plastic stent barely seen protruding out of the ampulla, and there was tissue overgrowth, which suggests stent malfunction/obstruction.  Stent removed with snare, and sent in toto for pathologic analysis.  I was afraid of losing the location of the ampulla by removing the duodenoscope and doing EUS, as it was quite hidden around voluminous edema, thus I decided not to do EUS.  ERCP was quite challenging, as due to the profound edema it was hard to maintain a "long" view of the ampulla.  Deep biliary access achieved.  The mid and distal CBD appeared normal but there appeared to be a generalized narrowing of the mid common hepatic duct with upstream intrahepatic biliary ductal dilatation. A 10 x 80 mm fully covered biliary wallstent was placed.  GIven the very small distance between the scope and the quite edematous duodenal mucosa, I was unable to deploy the stent with as much intraduodenal stent distance as I would have liked.  The pursestring of the stent actually became entangled with the elevator, which actually helped pull the stent out  further in the duodenum.  Stent was disentangled from the pursestring using cold biopsy forceps.  The distal margin of the stent was a little more in the duodenum as I would have liked, but is about as best as could be managed under these diffifult circumstances.  There was good drainage through the stent post-procedure.  Pancreatogram was not obtained, intentionally.  ENDOSCOPIC IMPRESSION: As above.  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  Wallstent placed.  RECOMMENDATIONS: 1.  Watch for potential complications of procedure. 2.  Await results from pathology (previously placed plastic biliary stent). 3.  Follow clinical and biochemical response to stenting. 4.  If patient's jaundice does not appreciably improve after stent placement, he will need percutaneous biliary decompression (PTC). 5.  Repeat LFTs in one week.     _______________________________ Lorrin MaisArta Silence, MD 12/29/2013 12:59 PM  CC:

## 2013-12-29 NOTE — Anesthesia Postprocedure Evaluation (Signed)
  Anesthesia Post-op Note  Patient: [name] [name]  Procedure(s) Performed: Procedure(s) (LRB): UPPER ENDOSCOPIC ULTRASOUND (EUS) LINEAR (N/A) ENDOSCOPIC RETROGRADE CHOLANGIOPANCREATOGRAPHY (ERCP) (N/A)  Patient Location: PACU  Anesthesia Type: General  Level of Consciousness: awake and alert   Airway and Oxygen Therapy: Patient Spontanous Breathing  Post-op Pain: mild  Post-op Assessment: Post-op Vital signs reviewed, Patient's Cardiovascular Status Stable, Respiratory Function Stable, Patent Airway and No signs of Nausea or vomiting  Last Vitals:  Filed Vitals:   12/29/13 1310  BP: 125/41  Pulse: 66  Temp:   Resp: 15    Post-op Vital Signs: stable   Complications: No apparent anesthesia complications

## 2013-12-29 NOTE — Addendum Note (Signed)
Addendum created 12/29/13 1400 by Gaetano Hawthorneharles L Gerarda Conklin, MD   Modules edited: Orders

## 2013-12-29 NOTE — Discharge Instructions (Signed)
Endoscopic Retrograde Cholangiopancreatography (ERCP) °ERCP stands for endoscopic retrograde cholangiopancreatography. In this procedure, a thin, lighted tube (endoscope) is used. It is passed through the mouth and down the back of the throat into the upper part of the intestine, called the duodenum. A small, plastic tube (cannula) is then passed through the endoscope. It is directed into the bile duct or pancreatic duct. Dye is then injected through the tube. X-rays are taken to study the biliary and pancreatic passageways. This procedure is used to diagnosis many diseases of the pancreas, bile ducts, liver, and gallbladder. °LET YOUR CAREGIVER KNOW ABOUT:  °· Allergies to food or medicine.  °· Medicines taken, including vitamins, herbs, eyedrops, over-the-counter medicines, and creams.  °· Use of steroids (by mouth or creams).  °· Previous problems with anesthetics or numbing medicines.  °· History of bleeding problems or blood clots.  °· Previous surgery.  °· Other health problems, including diabetes and kidney problems.  °· Possibility of pregnancy, if this applies.  °· Any barium X-rays during the past week.  °BEFORE THE PROCEDURE  °· Do not eat or drink anything, including water, for at least 6 hours before the procedure.  °· Ask your caregiver whether you should stop taking certain medicines prior to your procedure.  °· Arrange for someone to drive you home. You will not be allowed to drive for several hours after the procedure.  °· Arrive at least 60 minutes before the procedure or as directed. This will give you time to check in and fill out any necessary paperwork.  °PROCEDURE  °· You will be given medicine through a vein (intravenously) to make you relaxed and sleepy.  °· You might have a breathing tube placed to give you medicine that makes you sleep (general anesthetic).  °· Your throat may be sprayed with medicine that numbs the area (local anesthetic).  °· You will lie on your left side. The endoscope  will be inserted through your mouth and into the duodenum. The tube will not interfere with your breathing. Gagging is prevented by the anesthesia.  °· While X-rays are being taken, you may be positioned on your stomach.  °During the ERCP, the person performing the procedure may identify a blockage or narrowed opening to the bile duct. A muscular portion of the main bile duct may be partially cut (sphincterotomy). A thin, plastic tube (stent) will be positioned inside your bile duct. This is to allow fluid secreted by the liver (bile) to flow more easily through the narrowed opening. °AFTER THE PROCEDURE  °· You will rest in bed until you are fully conscious.  °· When you first wake up, your throat may feel slightly sore.  °· You will not be allowed to eat or drink until numbness subsides.  °· Once you are able to drink, urinate, and sit on the edge of the bed without feeling sick to your stomach (nauseous) or dizzy, you may be allowed to go home.  °· Have a friend or family member with you for the first 24 hours after your procedure.  °SEEK MEDICAL CARE IF:  °· You have an oral temperature above 102° F (38.9° C).  °· You develop other signs of infection, including chills or feeling unwell.  °· You have abdominal pain.  °· You have questions or concerns.  °MAKE SURE YOU:  °· Understand these instructions.  °· Will watch your condition.  °· Will get help right away if you are not doing well or get worse.  °  Document Released: 03/12/2001 Document Revised: 02/27/2011 Document Reviewed: 10/03/2009 °[employer]® Patient Information ©2012 [employer]. ° °

## 2013-12-29 NOTE — H&P (Signed)
Patient interval history reviewed.  Patient examined again.  There has been no change from documented H/P dated 12/23/13 (scanned into chart from our office) except as documented above.  Assessment:  1.  Obstructive jaundice. 2.  Prior pancreatitis.  Some smoldering inflammatory changes on recent CT, but he is asymptomatic. 3.  Biliary stricture.  Suspect stent migration versus clogged.  Plan:  1.  Endoscopic ultrasound with possible biopsies. 2.  ERCP with stent removal/replacement.

## 2013-12-29 NOTE — Transfer of Care (Signed)
Immediate Anesthesia Transfer of Care Note  Patient: [name] [name]  Procedure(s) Performed: Procedure(s): UPPER ENDOSCOPIC ULTRASOUND (EUS) LINEAR (N/A) ENDOSCOPIC RETROGRADE CHOLANGIOPANCREATOGRAPHY (ERCP) (N/A)  Patient Location: PACU  Anesthesia Type:General  Level of Consciousness: sedated and patient cooperative  Airway & Oxygen Therapy: Patient Spontanous Breathing and Patient connected to face mask oxygen  Post-op Assessment: Report given to PACU RN, Post -op Vital signs reviewed and stable and Patient moving all extremities X 4  Post vital signs: stable  Complications: No apparent anesthesia complications

## 2013-12-29 NOTE — Anesthesia Preprocedure Evaluation (Addendum)
Anesthesia Evaluation  Patient identified by MRN, date of birth, ID band Patient awake    Reviewed: Allergy & Precautions, H&P , NPO status , Patient's Chart, lab work & pertinent test results, reviewed documented beta blocker date and time   Airway Mallampati: II TM Distance: >3 FB Neck ROM: full    Dental no notable dental hx.    Pulmonary neg pulmonary ROS, shortness of breath, former smoker,  breath sounds clear to auscultation  Pulmonary exam normal       Cardiovascular hypertension, Pt. on home beta blockers + CAD and +CHF + pacemaker + Valvular Problems/Murmurs AS Rhythm:regular Rate:Normal  Diastolic heart failure   Neuro/Psych Depression Carotid disease negative neurological ROS  negative psych ROS   GI/Hepatic negative GI ROS, Neg liver ROS, GERD-  Medicated and Controlled,  Endo/Other  negative endocrine ROS  Renal/GU Renal diseaseStage 3 chronic kidney disease  negative genitourinary   Musculoskeletal   Abdominal   Peds  Hematology negative hematology ROS (+)   Anesthesia Other Findings   Reproductive/Obstetrics negative OB ROS                           Anesthesia Physical Anesthesia Plan  ASA: II  Anesthesia Plan: MAC   Post-op Pain Management:    Induction:   Airway Management Planned:   Additional Equipment:   Intra-op Plan:   Post-operative Plan:   Informed Consent: I have reviewed the patients History and Physical, chart, labs and discussed the procedure including the risks, benefits and alternatives for the proposed anesthesia with the patient or authorized representative who has indicated his/her understanding and acceptance.   Dental Advisory Given  Plan Discussed with: CRNA  Anesthesia Plan Comments:         Anesthesia Quick Evaluation                                  Anesthesia Evaluation  Patient identified by MRN, date of birth, ID band Patient  awake    Reviewed: Allergy & Precautions, H&P , NPO status , Patient's Chart, lab work & pertinent test results  Airway Mallampati: II TM Distance: >3 FB Neck ROM: Full    Dental no notable dental hx.    Pulmonary shortness of breath, former smoker,  breath sounds clear to auscultation  Pulmonary exam normal       Cardiovascular hypertension, Pt. on medications + CAD, + Peripheral Vascular Disease and +CHF + dysrhythmias + pacemaker + Valvular Problems/Murmurs AS Rhythm:Regular Rate:Normal + Systolic murmurs H/O complete heart block.  Diastolic heart failure.  Recent cardiology office visit on 10-11-13 reviewed.  He is having no chest pains recently. Does have DOE which is being worked up and he was to have a stress test.   Neuro/Psych PSYCHIATRIC DISORDERS Depression negative neurological ROS     GI/Hepatic Neg liver ROS, PUD, GERD-  Medicated,  Endo/Other  negative endocrine ROS  Renal/GU CRFRenal diseaseChronic kidney disease stage 3.  negative genitourinary   Musculoskeletal negative musculoskeletal ROS (+)   Abdominal   Peds negative pediatric ROS (+)  Hematology negative hematology ROS (+)   Anesthesia Other Findings   Reproductive/Obstetrics negative OB ROS                        Anesthesia Physical Anesthesia Plan  ASA: III  Anesthesia Plan: General   Post-op Pain Management:  Induction: Intravenous  Airway Management Planned: Oral ETT  Additional Equipment:   Intra-op Plan:   Post-operative Plan: Extubation in OR  Informed Consent: I have reviewed the patients History and Physical, chart, labs and discussed the procedure including the risks, benefits and alternatives for the proposed anesthesia with the patient or authorized representative who has indicated his/her understanding and acceptance.   Dental advisory given  Plan Discussed with: CRNA  Anesthesia Plan Comments: (At increased risk due to  comorbidities and DOE which hasn't been fully worked up. He is jaundiced and needs his procedure today. )       Anesthesia Quick Evaluation                                   Anesthesia Evaluation  Patient identified by MRN, date of birth, ID band Patient awake    Reviewed: Allergy & Precautions, H&P , NPO status , Patient's Chart, lab work & pertinent test results  Airway Mallampati: II TM Distance: >3 FB Neck ROM: Full    Dental no notable dental hx.    Pulmonary shortness of breath, former smoker,  breath sounds clear to auscultation  Pulmonary exam normal       Cardiovascular hypertension, Pt. on medications + CAD, + Peripheral Vascular Disease and +CHF + dysrhythmias + pacemaker + Valvular Problems/Murmurs AS Rhythm:Regular Rate:Normal + Systolic murmurs H/O complete heart block.  Diastolic heart failure.  Recent cardiology office visit on 10-11-13 reviewed.  He is having no chest pains recently. Does have DOE which is being worked up and he was to have a stress test.   Neuro/Psych PSYCHIATRIC DISORDERS Depression negative neurological ROS     GI/Hepatic Neg liver ROS, PUD, GERD-  Medicated,  Endo/Other  negative endocrine ROS  Renal/GU CRFRenal diseaseChronic kidney disease stage 3.  negative genitourinary   Musculoskeletal negative musculoskeletal ROS (+)   Abdominal   Peds negative pediatric ROS (+)  Hematology negative hematology ROS (+)   Anesthesia Other Findings   Reproductive/Obstetrics negative OB ROS                        Anesthesia Physical Anesthesia Plan  ASA: III  Anesthesia Plan: General   Post-op Pain Management:    Induction: Intravenous  Airway Management Planned: Oral ETT  Additional Equipment:   Intra-op Plan:   Post-operative Plan: Extubation in OR  Informed Consent: I have reviewed the patients History and Physical, chart, labs and discussed the procedure including the risks,  benefits and alternatives for the proposed anesthesia with the patient or authorized representative who has indicated his/her understanding and acceptance.   Dental advisory given  Plan Discussed with: CRNA  Anesthesia Plan Comments: (At increased risk due to comorbidities and DOE which hasn't been fully worked up. He is jaundiced and needs his procedure today. )       Anesthesia Quick Evaluation                                   Anesthesia Evaluation  Patient identified by MRN, date of birth, ID band Patient awake  General Assessment Comment:Advanced yrs Increased CV risk  Reviewed: Allergy & Precautions, H&P , NPO status , Patient's Chart, lab work & pertinent test results, reviewed documented beta blocker date and time   Airway Mallampati: II TM Distance: >3 FB  Neck ROM: Full    Dental  (+) Teeth Intact   Pulmonary neg pulmonary ROS,  clear to auscultation        Cardiovascular hypertension, Pt. on medications + CAD and +CHF Regular Normal Pacemaker 2008 given clearance for surgery, Feb 20th Currently asymptomatic   Neuro/Psych Negative Neurological ROS  Negative Psych ROS   GI/Hepatic negative GI ROS, Neg liver ROS,   Endo/Other  Gout  Renal/GU Elevated Cr 1.60   BPH     Musculoskeletal negative musculoskeletal ROS (+)   Abdominal   Peds negative pediatric ROS (+)  Hematology negative hematology ROS (+)   Anesthesia Other Findings   Reproductive/Obstetrics negative OB ROS                           Anesthesia Physical Anesthesia Plan  ASA: III  Anesthesia Plan: Spinal   Post-op Pain Management:    Induction: Intravenous  Airway Management Planned: Mask  Additional Equipment:   Intra-op Plan:   Post-operative Plan:   Informed Consent: I have reviewed the patients History and Physical, chart, labs and discussed the procedure including the risks, benefits and alternatives for the proposed anesthesia  with the patient or authorized representative who has indicated his/her understanding and acceptance.     Plan Discussed with: CRNA and Surgeon  Anesthesia Plan Comments:         Anesthesia Quick Evaluation                                   Anesthesia Evaluation  Patient identified by MRN, date of birth, ID band Patient awake    Reviewed: Allergy & Precautions, H&P , NPO status , Patient's Chart, lab work & pertinent test results  Airway Mallampati: II TM Distance: >3 FB Neck ROM: Full    Dental no notable dental hx.    Pulmonary shortness of breath, former smoker,  breath sounds clear to auscultation  Pulmonary exam normal       Cardiovascular hypertension, Pt. on medications + CAD, + Peripheral Vascular Disease and +CHF + dysrhythmias + pacemaker + Valvular Problems/Murmurs AS Rhythm:Regular Rate:Normal + Systolic murmurs H/O complete heart block.  Diastolic heart failure.  Recent cardiology office visit on 10-11-13 reviewed.  He is having no chest pains recently. Does have DOE which is being worked up and he was to have a stress test.   Neuro/Psych PSYCHIATRIC DISORDERS Depression negative neurological ROS     GI/Hepatic Neg liver ROS, PUD, GERD-  Medicated,  Endo/Other  negative endocrine ROS  Renal/GU CRFRenal diseaseChronic kidney disease stage 3.  negative genitourinary   Musculoskeletal negative musculoskeletal ROS (+)   Abdominal   Peds negative pediatric ROS (+)  Hematology negative hematology ROS (+)   Anesthesia Other Findings   Reproductive/Obstetrics negative OB ROS                        Anesthesia Physical Anesthesia Plan  ASA: III  Anesthesia Plan: General   Post-op Pain Management:    Induction: Intravenous  Airway Management Planned: Oral ETT  Additional Equipment:   Intra-op Plan:   Post-operative Plan: Extubation in OR  Informed Consent: I have reviewed the patients History and  Physical, chart, labs and discussed the procedure including the risks, benefits and alternatives for the proposed anesthesia with the patient or authorized representative who has indicated his/her understanding  and acceptance.   Dental advisory given  Plan Discussed with: CRNA  Anesthesia Plan Comments: (At increased risk due to comorbidities and DOE which hasn't been fully worked up. He is jaundiced and needs his procedure today. )       Anesthesia Quick Evaluation                                   Anesthesia Evaluation  Patient identified by MRN, date of birth, ID band Patient awake    Reviewed: Allergy & Precautions, H&P , NPO status , Patient's Chart, lab work & pertinent test results  Airway Mallampati: II TM Distance: >3 FB Neck ROM: Full    Dental no notable dental hx.    Pulmonary shortness of breath, former smoker,  breath sounds clear to auscultation  Pulmonary exam normal       Cardiovascular hypertension, Pt. on medications + CAD, + Peripheral Vascular Disease and +CHF + dysrhythmias + pacemaker + Valvular Problems/Murmurs AS Rhythm:Regular Rate:Normal + Systolic murmurs H/O complete heart block.  Diastolic heart failure.  Recent cardiology office visit on 10-11-13 reviewed.  He is having no chest pains recently. Does have DOE which is being worked up and he was to have a stress test.   Neuro/Psych PSYCHIATRIC DISORDERS Depression negative neurological ROS     GI/Hepatic Neg liver ROS, PUD, GERD-  Medicated,  Endo/Other  negative endocrine ROS  Renal/GU CRFRenal diseaseChronic kidney disease stage 3.  negative genitourinary   Musculoskeletal negative musculoskeletal ROS (+)   Abdominal   Peds negative pediatric ROS (+)  Hematology negative hematology ROS (+)   Anesthesia Other Findings   Reproductive/Obstetrics negative OB ROS                        Anesthesia Physical Anesthesia Plan  ASA:  III  Anesthesia Plan: General   Post-op Pain Management:    Induction: Intravenous  Airway Management Planned: Oral ETT  Additional Equipment:   Intra-op Plan:   Post-operative Plan: Extubation in OR  Informed Consent: I have reviewed the patients History and Physical, chart, labs and discussed the procedure including the risks, benefits and alternatives for the proposed anesthesia with the patient or authorized representative who has indicated his/her understanding and acceptance.   Dental advisory given  Plan Discussed with: CRNA  Anesthesia Plan Comments: (At increased risk due to comorbidities and DOE which hasn't been fully worked up. He is jaundiced and needs his procedure today. )       Anesthesia Quick Evaluation                                   Anesthesia Evaluation  Patient identified by MRN, date of birth, ID band Patient awake    Reviewed: Allergy & Precautions, H&P , NPO status , Patient's Chart, lab work & pertinent test results  Airway Mallampati: II TM Distance: >3 FB Neck ROM: Full    Dental no notable dental hx.    Pulmonary shortness of breath, former smoker,  breath sounds clear to auscultation  Pulmonary exam normal       Cardiovascular hypertension, Pt. on medications + CAD, + Peripheral Vascular Disease and +CHF + dysrhythmias + pacemaker + Valvular Problems/Murmurs AS Rhythm:Regular Rate:Normal + Systolic murmurs H/O complete heart block.  Diastolic heart failure.  Recent cardiology office visit  on 10-11-13 reviewed.  He is having no chest pains recently. Does have DOE which is being worked up and he was to have a stress test.   Neuro/Psych PSYCHIATRIC DISORDERS Depression negative neurological ROS     GI/Hepatic Neg liver ROS, PUD, GERD-  Medicated,  Endo/Other  negative endocrine ROS  Renal/GU CRFRenal diseaseChronic kidney disease stage 3.  negative genitourinary   Musculoskeletal negative musculoskeletal  ROS (+)   Abdominal   Peds negative pediatric ROS (+)  Hematology negative hematology ROS (+)   Anesthesia Other Findings   Reproductive/Obstetrics negative OB ROS                        Anesthesia Physical Anesthesia Plan  ASA: III  Anesthesia Plan: General   Post-op Pain Management:    Induction: Intravenous  Airway Management Planned: Oral ETT  Additional Equipment:   Intra-op Plan:   Post-operative Plan: Extubation in OR  Informed Consent: I have reviewed the patients History and Physical, chart, labs and discussed the procedure including the risks, benefits and alternatives for the proposed anesthesia with the patient or authorized representative who has indicated his/her understanding and acceptance.   Dental advisory given  Plan Discussed with: CRNA  Anesthesia Plan Comments: (At increased risk due to comorbidities and DOE which hasn't been fully worked up. He is jaundiced and needs his procedure today. )       Anesthesia Quick Evaluation                                   Anesthesia Evaluation  Patient identified by MRN, date of birth, ID band Patient awake    Reviewed: Allergy & Precautions, H&P , NPO status , Patient's Chart, lab work & pertinent test results  Airway Mallampati: II TM Distance: >3 FB Neck ROM: Full    Dental no notable dental hx.    Pulmonary shortness of breath, former smoker,  breath sounds clear to auscultation  Pulmonary exam normal       Cardiovascular hypertension, Pt. on medications + CAD, + Peripheral Vascular Disease and +CHF + dysrhythmias + pacemaker + Valvular Problems/Murmurs AS Rhythm:Regular Rate:Normal + Systolic murmurs H/O complete heart block.  Diastolic heart failure.  Recent cardiology office visit on 10-11-13 reviewed.  He is having no chest pains recently. Does have DOE which is being worked up and he was to have a stress test.   Neuro/Psych PSYCHIATRIC DISORDERS  Depression negative neurological ROS     GI/Hepatic Neg liver ROS, PUD, GERD-  Medicated,  Endo/Other  negative endocrine ROS  Renal/GU CRFRenal diseaseChronic kidney disease stage 3.  negative genitourinary   Musculoskeletal negative musculoskeletal ROS (+)   Abdominal   Peds negative pediatric ROS (+)  Hematology negative hematology ROS (+)   Anesthesia Other Findings   Reproductive/Obstetrics negative OB ROS                        Anesthesia Physical Anesthesia Plan  ASA: III  Anesthesia Plan: General   Post-op Pain Management:    Induction: Intravenous  Airway Management Planned: Oral ETT  Additional Equipment:   Intra-op Plan:   Post-operative Plan: Extubation in OR  Informed Consent: I have reviewed the patients History and Physical, chart, labs and discussed the procedure including the risks, benefits and alternatives for the proposed anesthesia with the patient or authorized representative who has  indicated his/her understanding and acceptance.   Dental advisory given  Plan Discussed with: CRNA  Anesthesia Plan Comments: (At increased risk due to comorbidities and DOE which hasn't been fully worked up. He is jaundiced and needs his procedure today. )       Anesthesia Quick Evaluation                                   Anesthesia Evaluation  Patient identified by MRN, date of birth, ID band Patient awake    Reviewed: Allergy & Precautions, H&P , NPO status , Patient's Chart, lab work & pertinent test results  Airway Mallampati: II TM Distance: >3 FB Neck ROM: Full    Dental no notable dental hx.    Pulmonary shortness of breath, former smoker,  breath sounds clear to auscultation  Pulmonary exam normal       Cardiovascular hypertension, Pt. on medications + CAD, + Peripheral Vascular Disease and +CHF + dysrhythmias + pacemaker + Valvular Problems/Murmurs AS Rhythm:Regular Rate:Normal + Systolic  murmurs H/O complete heart block.  Diastolic heart failure.  Recent cardiology office visit on 10-11-13 reviewed.  He is having no chest pains recently. Does have DOE which is being worked up and he was to have a stress test.   Neuro/Psych PSYCHIATRIC DISORDERS Depression negative neurological ROS     GI/Hepatic Neg liver ROS, PUD, GERD-  Medicated,  Endo/Other  negative endocrine ROS  Renal/GU CRFRenal diseaseChronic kidney disease stage 3.  negative genitourinary   Musculoskeletal negative musculoskeletal ROS (+)   Abdominal   Peds negative pediatric ROS (+)  Hematology negative hematology ROS (+)   Anesthesia Other Findings   Reproductive/Obstetrics negative OB ROS                        Anesthesia Physical Anesthesia Plan  ASA: III  Anesthesia Plan: General   Post-op Pain Management:    Induction: Intravenous  Airway Management Planned: Oral ETT  Additional Equipment:   Intra-op Plan:   Post-operative Plan: Extubation in OR  Informed Consent: I have reviewed the patients History and Physical, chart, labs and discussed the procedure including the risks, benefits and alternatives for the proposed anesthesia with the patient or authorized representative who has indicated his/her understanding and acceptance.   Dental advisory given  Plan Discussed with: CRNA  Anesthesia Plan Comments: (At increased risk due to comorbidities and DOE which hasn't been fully worked up. He is jaundiced and needs his procedure today. )       Anesthesia Quick Evaluation

## 2013-12-30 ENCOUNTER — Encounter (HOSPITAL_COMMUNITY): Payer: Self-pay | Admitting: Gastroenterology

## 2014-01-03 ENCOUNTER — Encounter: Payer: Self-pay | Admitting: Cardiology

## 2014-01-05 ENCOUNTER — Encounter (HOSPITAL_COMMUNITY): Payer: Self-pay | Admitting: Emergency Medicine

## 2014-01-05 ENCOUNTER — Inpatient Hospital Stay (HOSPITAL_COMMUNITY): Payer: Medicare Other

## 2014-01-05 ENCOUNTER — Inpatient Hospital Stay (HOSPITAL_COMMUNITY)
Admission: EM | Admit: 2014-01-05 | Discharge: 2014-01-10 | DRG: 441 | Disposition: A | Discharge status: Skilled Nursing Facility | Payer: Medicare Other | Attending: Internal Medicine | Admitting: Internal Medicine

## 2014-01-05 ENCOUNTER — Emergency Department (HOSPITAL_COMMUNITY): Payer: Medicare Other

## 2014-01-05 DIAGNOSIS — R269 Unspecified abnormalities of gait and mobility: Secondary | ICD-10-CM | POA: Diagnosis present

## 2014-01-05 DIAGNOSIS — R2689 Other abnormalities of gait and mobility: Secondary | ICD-10-CM

## 2014-01-05 DIAGNOSIS — Z87891 Personal history of nicotine dependence: Secondary | ICD-10-CM

## 2014-01-05 DIAGNOSIS — H3530 Unspecified macular degeneration: Secondary | ICD-10-CM | POA: Diagnosis present

## 2014-01-05 DIAGNOSIS — K859 Acute pancreatitis without necrosis or infection, unspecified: Secondary | ICD-10-CM | POA: Diagnosis present

## 2014-01-05 DIAGNOSIS — D649 Anemia, unspecified: Secondary | ICD-10-CM | POA: Diagnosis present

## 2014-01-05 DIAGNOSIS — I442 Atrioventricular block, complete: Secondary | ICD-10-CM

## 2014-01-05 DIAGNOSIS — Z6825 Body mass index (BMI) 25.0-25.9, adult: Secondary | ICD-10-CM

## 2014-01-05 DIAGNOSIS — R112 Nausea with vomiting, unspecified: Secondary | ICD-10-CM

## 2014-01-05 DIAGNOSIS — R2681 Unsteadiness on feet: Secondary | ICD-10-CM

## 2014-01-05 DIAGNOSIS — R17 Unspecified jaundice: Secondary | ICD-10-CM | POA: Diagnosis present

## 2014-01-05 DIAGNOSIS — W19XXXA Unspecified fall, initial encounter: Secondary | ICD-10-CM

## 2014-01-05 DIAGNOSIS — N039 Chronic nephritic syndrome with unspecified morphologic changes: Secondary | ICD-10-CM

## 2014-01-05 DIAGNOSIS — D631 Anemia in chronic kidney disease: Secondary | ICD-10-CM

## 2014-01-05 DIAGNOSIS — Z86718 Personal history of other venous thrombosis and embolism: Secondary | ICD-10-CM

## 2014-01-05 DIAGNOSIS — N183 Chronic kidney disease, stage 3 unspecified: Secondary | ICD-10-CM

## 2014-01-05 DIAGNOSIS — N289 Disorder of kidney and ureter, unspecified: Secondary | ICD-10-CM

## 2014-01-05 DIAGNOSIS — N189 Chronic kidney disease, unspecified: Secondary | ICD-10-CM

## 2014-01-05 DIAGNOSIS — M1990 Unspecified osteoarthritis, unspecified site: Secondary | ICD-10-CM | POA: Diagnosis present

## 2014-01-05 DIAGNOSIS — Z7982 Long term (current) use of aspirin: Secondary | ICD-10-CM

## 2014-01-05 DIAGNOSIS — I679 Cerebrovascular disease, unspecified: Secondary | ICD-10-CM | POA: Diagnosis present

## 2014-01-05 DIAGNOSIS — F329 Major depressive disorder, single episode, unspecified: Secondary | ICD-10-CM | POA: Diagnosis present

## 2014-01-05 DIAGNOSIS — I5033 Acute on chronic diastolic (congestive) heart failure: Secondary | ICD-10-CM

## 2014-01-05 DIAGNOSIS — E43 Unspecified severe protein-calorie malnutrition: Secondary | ICD-10-CM

## 2014-01-05 DIAGNOSIS — M109 Gout, unspecified: Secondary | ICD-10-CM | POA: Diagnosis present

## 2014-01-05 DIAGNOSIS — R0602 Shortness of breath: Secondary | ICD-10-CM

## 2014-01-05 DIAGNOSIS — Z806 Family history of leukemia: Secondary | ICD-10-CM

## 2014-01-05 DIAGNOSIS — K7200 Acute and subacute hepatic failure without coma: Principal | ICD-10-CM | POA: Diagnosis present

## 2014-01-05 DIAGNOSIS — K8500 Idiopathic acute pancreatitis without necrosis or infection: Secondary | ICD-10-CM

## 2014-01-05 DIAGNOSIS — K219 Gastro-esophageal reflux disease without esophagitis: Secondary | ICD-10-CM | POA: Diagnosis present

## 2014-01-05 DIAGNOSIS — E785 Hyperlipidemia, unspecified: Secondary | ICD-10-CM

## 2014-01-05 DIAGNOSIS — Z96659 Presence of unspecified artificial knee joint: Secondary | ICD-10-CM

## 2014-01-05 DIAGNOSIS — I6529 Occlusion and stenosis of unspecified carotid artery: Secondary | ICD-10-CM

## 2014-01-05 DIAGNOSIS — Z8711 Personal history of peptic ulcer disease: Secondary | ICD-10-CM

## 2014-01-05 DIAGNOSIS — I350 Nonrheumatic aortic (valve) stenosis: Secondary | ICD-10-CM

## 2014-01-05 DIAGNOSIS — I509 Heart failure, unspecified: Secondary | ICD-10-CM | POA: Diagnosis present

## 2014-01-05 DIAGNOSIS — Z79899 Other long term (current) drug therapy: Secondary | ICD-10-CM

## 2014-01-05 DIAGNOSIS — F3289 Other specified depressive episodes: Secondary | ICD-10-CM | POA: Diagnosis present

## 2014-01-05 DIAGNOSIS — M7989 Other specified soft tissue disorders: Secondary | ICD-10-CM

## 2014-01-05 DIAGNOSIS — R110 Nausea: Secondary | ICD-10-CM | POA: Diagnosis present

## 2014-01-05 DIAGNOSIS — Z950 Presence of cardiac pacemaker: Secondary | ICD-10-CM

## 2014-01-05 DIAGNOSIS — I951 Orthostatic hypotension: Secondary | ICD-10-CM

## 2014-01-05 DIAGNOSIS — R0789 Other chest pain: Secondary | ICD-10-CM

## 2014-01-05 DIAGNOSIS — Z833 Family history of diabetes mellitus: Secondary | ICD-10-CM

## 2014-01-05 DIAGNOSIS — I2510 Atherosclerotic heart disease of native coronary artery without angina pectoris: Secondary | ICD-10-CM

## 2014-01-05 DIAGNOSIS — Z87442 Personal history of urinary calculi: Secondary | ICD-10-CM

## 2014-01-05 DIAGNOSIS — R0600 Dyspnea, unspecified: Secondary | ICD-10-CM

## 2014-01-05 DIAGNOSIS — I129 Hypertensive chronic kidney disease with stage 1 through stage 4 chronic kidney disease, or unspecified chronic kidney disease: Secondary | ICD-10-CM | POA: Diagnosis present

## 2014-01-05 DIAGNOSIS — N400 Enlarged prostate without lower urinary tract symptoms: Secondary | ICD-10-CM | POA: Diagnosis present

## 2014-01-05 LAB — TROPONIN I

## 2014-01-05 LAB — CBC WITH DIFFERENTIAL/PLATELET
Basophils Absolute: 0 10*3/uL (ref 0.0–0.1)
Basophils Relative: 0 % (ref 0–1)
Eosinophils Absolute: 0 10*3/uL (ref 0.0–0.7)
Eosinophils Relative: 1 % (ref 0–5)
HCT: 25.6 % — ABNORMAL LOW (ref 39.0–52.0)
Hemoglobin: 8.5 g/dL — ABNORMAL LOW (ref 13.0–17.0)
LYMPHS ABS: 1.7 10*3/uL (ref 0.7–4.0)
LYMPHS PCT: 29 % (ref 12–46)
MCH: 35.3 pg — ABNORMAL HIGH (ref 26.0–34.0)
MCHC: 33.2 g/dL (ref 30.0–36.0)
MCV: 106.2 fL — ABNORMAL HIGH (ref 78.0–100.0)
Monocytes Absolute: 0.6 10*3/uL (ref 0.1–1.0)
Monocytes Relative: 11 % (ref 3–12)
NEUTROS ABS: 3.5 10*3/uL (ref 1.7–7.7)
Neutrophils Relative %: 59 % (ref 43–77)
PLATELETS: 178 10*3/uL (ref 150–400)
RBC: 2.41 MIL/uL — AB (ref 4.22–5.81)
RDW: 17.8 % — AB (ref 11.5–15.5)
WBC: 5.8 10*3/uL (ref 4.0–10.5)

## 2014-01-05 LAB — URINALYSIS, ROUTINE W REFLEX MICROSCOPIC
Glucose, UA: NEGATIVE mg/dL
Hgb urine dipstick: NEGATIVE
Ketones, ur: NEGATIVE mg/dL
NITRITE: NEGATIVE
Protein, ur: NEGATIVE mg/dL
SPECIFIC GRAVITY, URINE: 1.017 (ref 1.005–1.030)
UROBILINOGEN UA: 4 mg/dL — AB (ref 0.0–1.0)
pH: 5 (ref 5.0–8.0)

## 2014-01-05 LAB — URINE MICROSCOPIC-ADD ON

## 2014-01-05 LAB — COMPREHENSIVE METABOLIC PANEL
ALT: 70 U/L — AB (ref 0–53)
AST: 104 U/L — ABNORMAL HIGH (ref 0–37)
Albumin: 1.9 g/dL — ABNORMAL LOW (ref 3.5–5.2)
Alkaline Phosphatase: 2004 U/L — ABNORMAL HIGH (ref 39–117)
Anion gap: 14 (ref 5–15)
BILIRUBIN TOTAL: 7.8 mg/dL — AB (ref 0.3–1.2)
BUN: 52 mg/dL — ABNORMAL HIGH (ref 6–23)
CHLORIDE: 95 meq/L — AB (ref 96–112)
CO2: 26 meq/L (ref 19–32)
Calcium: 9 mg/dL (ref 8.4–10.5)
Creatinine, Ser: 2.12 mg/dL — ABNORMAL HIGH (ref 0.50–1.35)
GFR calc Af Amer: 31 mL/min — ABNORMAL LOW (ref >90)
GFR, EST NON AFRICAN AMERICAN: 26 mL/min — AB (ref >90)
Glucose, Bld: 180 mg/dL — ABNORMAL HIGH (ref 70–99)
Potassium: 3.4 mEq/L — ABNORMAL LOW (ref 3.7–5.3)
SODIUM: 135 meq/L — AB (ref 137–147)
Total Protein: 6.2 g/dL (ref 6.0–8.3)

## 2014-01-05 LAB — AMMONIA
AMMONIA: 59 umol/L (ref 11–60)
AMMONIA: 32 umol/L (ref 11–60)

## 2014-01-05 LAB — PROTIME-INR
INR: 1.12 (ref 0.00–1.49)
PROTHROMBIN TIME: 14.4 s (ref 11.6–15.2)

## 2014-01-05 LAB — APTT: aPTT: 30 seconds (ref 24–37)

## 2014-01-05 LAB — LIPASE, BLOOD: Lipase: 702 U/L — ABNORMAL HIGH (ref 11–59)

## 2014-01-05 MED ORDER — FUROSEMIDE 40 MG PO TABS
40.0000 mg | ORAL_TABLET | Freq: Every morning | ORAL | Status: DC
  Administered 2014-01-06 – 2014-01-10 (×5): 40 mg via ORAL
  Filled 2014-01-05 (×5): qty 1

## 2014-01-05 MED ORDER — LACTULOSE 10 GM/15ML PO SOLN
20.0000 g | Freq: Two times a day (BID) | ORAL | Status: DC
  Administered 2014-01-05 – 2014-01-10 (×10): 20 g via ORAL
  Filled 2014-01-05 (×12): qty 30

## 2014-01-05 MED ORDER — ADULT MULTIVITAMIN W/MINERALS CH
1.0000 | ORAL_TABLET | Freq: Every day | ORAL | Status: DC
  Administered 2014-01-05 – 2014-01-10 (×6): 1 via ORAL
  Filled 2014-01-05 (×6): qty 1

## 2014-01-05 MED ORDER — ONDANSETRON HCL 4 MG PO TABS: 4.0000 mg | ORAL_TABLET | Freq: Four times a day (QID) | ORAL | Status: DC | PRN

## 2014-01-05 MED ORDER — SODIUM CHLORIDE 0.9 % IJ SOLN: 3.0000 mL | INTRAMUSCULAR | Status: DC | PRN

## 2014-01-05 MED ORDER — SODIUM CHLORIDE 0.9 % IV SOLN
250.0000 mL | INTRAVENOUS | Status: DC | PRN
Start: 2014-01-05 — End: 2014-01-10

## 2014-01-05 MED ORDER — METOCLOPRAMIDE HCL 5 MG PO TABS
5.0000 mg | ORAL_TABLET | Freq: Two times a day (BID) | ORAL | Status: DC
  Administered 2014-01-05 – 2014-01-10 (×10): 5 mg via ORAL
  Filled 2014-01-05 (×11): qty 1

## 2014-01-05 MED ORDER — ASPIRIN EC 81 MG PO TBEC
81.0000 mg | DELAYED_RELEASE_TABLET | Freq: Every day | ORAL | Status: DC
  Administered 2014-01-06 – 2014-01-10 (×5): 81 mg via ORAL
  Filled 2014-01-05 (×5): qty 1

## 2014-01-05 MED ORDER — MAGNESIUM HYDROXIDE 400 MG/5ML PO SUSP
30.0000 mL | Freq: Every day | ORAL | Status: DC | PRN
Start: 2014-01-05 — End: 2014-01-10

## 2014-01-05 MED ORDER — POLYVINYL ALCOHOL 1.4 % OP SOLN
1.0000 [drp] | Freq: Three times a day (TID) | OPHTHALMIC | Status: DC | PRN
  Filled 2014-01-05: qty 15

## 2014-01-05 MED ORDER — SPIRONOLACTONE 12.5 MG HALF TABLET
12.5000 mg | ORAL_TABLET | Freq: Every morning | ORAL | Status: DC
  Administered 2014-01-06 – 2014-01-10 (×5): 12.5 mg via ORAL
  Filled 2014-01-05 (×5): qty 1

## 2014-01-05 MED ORDER — BISACODYL 10 MG RE SUPP: 10.0000 mg | Freq: Every day | RECTAL | Status: DC | PRN

## 2014-01-05 MED ORDER — SODIUM CHLORIDE 0.9 % IJ SOLN
3.0000 mL | Freq: Two times a day (BID) | INTRAMUSCULAR | Status: DC
  Administered 2014-01-06 – 2014-01-07 (×2): 3 mL via INTRAVENOUS

## 2014-01-05 MED ORDER — ACETAMINOPHEN 650 MG RE SUPP: 325.0000 mg | Freq: Four times a day (QID) | RECTAL | Status: DC | PRN

## 2014-01-05 MED ORDER — PNEUMOCOCCAL VAC POLYVALENT 25 MCG/0.5ML IJ INJ
0.5000 mL | INJECTION | INTRAMUSCULAR | Status: DC
  Filled 2014-01-05 (×4): qty 0.5

## 2014-01-05 MED ORDER — ALLOPURINOL 300 MG PO TABS
300.0000 mg | ORAL_TABLET | Freq: Every day | ORAL | Status: DC
  Administered 2014-01-06 – 2014-01-10 (×5): 300 mg via ORAL
  Filled 2014-01-05 (×5): qty 1

## 2014-01-05 MED ORDER — METOPROLOL TARTRATE 25 MG PO TABS
25.0000 mg | ORAL_TABLET | Freq: Two times a day (BID) | ORAL | Status: DC
  Administered 2014-01-05 – 2014-01-10 (×10): 25 mg via ORAL
  Filled 2014-01-05 (×11): qty 1

## 2014-01-05 MED ORDER — ENOXAPARIN SODIUM 30 MG/0.3ML ~~LOC~~ SOLN
30.0000 mg | SUBCUTANEOUS | Status: DC
  Administered 2014-01-05 – 2014-01-09 (×5): 30 mg via SUBCUTANEOUS
  Filled 2014-01-05 (×6): qty 0.3

## 2014-01-05 MED ORDER — HYPROMELLOSE (GONIOSCOPIC) 2.5 % OP SOLN: 1.0000 [drp] | Freq: Three times a day (TID) | OPHTHALMIC | Status: DC | PRN

## 2014-01-05 MED ORDER — FINASTERIDE 5 MG PO TABS
5.0000 mg | ORAL_TABLET | Freq: Every day | ORAL | Status: DC
  Administered 2014-01-05 – 2014-01-10 (×6): 5 mg via ORAL
  Filled 2014-01-05 (×6): qty 1

## 2014-01-05 MED ORDER — PANTOPRAZOLE SODIUM 40 MG PO TBEC
40.0000 mg | DELAYED_RELEASE_TABLET | Freq: Two times a day (BID) | ORAL | Status: DC
  Administered 2014-01-05 – 2014-01-10 (×10): 40 mg via ORAL
  Filled 2014-01-05 (×11): qty 1

## 2014-01-05 MED ORDER — POTASSIUM CHLORIDE IN NACL 20-0.9 MEQ/L-% IV SOLN
INTRAVENOUS | Status: DC
  Administered 2014-01-05 – 2014-01-07 (×3): via INTRAVENOUS
  Filled 2014-01-05 (×7): qty 1000

## 2014-01-05 MED ORDER — ONDANSETRON HCL 4 MG/2ML IJ SOLN
4.0000 mg | Freq: Four times a day (QID) | INTRAMUSCULAR | Status: DC | PRN
  Filled 2014-01-05: qty 2

## 2014-01-05 MED ORDER — SERTRALINE HCL 100 MG PO TABS
100.0000 mg | ORAL_TABLET | Freq: Every morning | ORAL | Status: DC
  Administered 2014-01-06 – 2014-01-10 (×5): 100 mg via ORAL
  Filled 2014-01-05 (×5): qty 1

## 2014-01-05 MED ORDER — OXYCODONE HCL 5 MG PO TABS
5.0000 mg | ORAL_TABLET | ORAL | Status: DC | PRN
Start: 2014-01-05 — End: 2014-01-10
  Administered 2014-01-05: 5 mg via ORAL
  Filled 2014-01-05: qty 1

## 2014-01-05 MED ORDER — ALUM & MAG HYDROXIDE-SIMETH 200-200-20 MG/5ML PO SUSP
30.0000 mL | Freq: Four times a day (QID) | ORAL | Status: DC | PRN
  Administered 2014-01-05: 30 mL via ORAL
  Filled 2014-01-05: qty 30

## 2014-01-05 MED ORDER — PROSIGHT PO TABS
1.0000 | ORAL_TABLET | Freq: Two times a day (BID) | ORAL | Status: DC
  Administered 2014-01-05 – 2014-01-10 (×10): 1 via ORAL
  Filled 2014-01-05 (×11): qty 1

## 2014-01-05 MED ORDER — ACETAMINOPHEN 325 MG PO TABS
325.0000 mg | ORAL_TABLET | Freq: Four times a day (QID) | ORAL | Status: DC | PRN
  Administered 2014-01-06: 325 mg via ORAL
  Filled 2014-01-05 (×2): qty 1

## 2014-01-05 NOTE — ED Notes (Signed)
Dr Rubin PayorPickering in with patient at time of arrival; daughter at bedside

## 2014-01-05 NOTE — ED Provider Notes (Signed)
CSN: [id number]     Arrival date & time 01/05/14  1508 History   First MD Initiated Contact with Patient 01/05/14 1539     Chief Complaint  Patient presents with  . Weakness     (Consider location/radiation/quality/duration/timing/severity/associated sxs/prior Treatment) Patient is a 80 y.o. male presenting with weakness. The history is provided by the patient.  Weakness This is a chronic problem. Associated symptoms include abdominal pain. Pertinent negatives include no chest pain, no headaches and no shortness of breath.   patient's had generalized weakness and nausea. He has had pancreatitis and a biliary stent. Recently replaced due to obstruction. He had an increase of bilirubin but has improved. His nausea has not improved. He has had a decreased appetite. No diarrhea. No chest pain. He is sleeping 90% of the time per family. He has had new random bruising. Mild abdominal pain. He had lab work drawn by Dr. all other day with reported improvement of his bilirubin and alkaline phosphatase. He states that Dr. [name] told him to come to the ER.  Past Medical History  Diagnosis Date  . Osteoarthritis   . Gout   . Hypertension   . History of coronary artery disease   . GERD (gastroesophageal reflux disease)   . CHF (congestive heart failure)   . Diastolic dysfunction   . History of GI bleed   . History of pacemaker     Medtronic Adapta ADDro1  . Peptic ulcer disease   . BPH (benign prostatic hypertrophy)   . 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 Hyperlipidemia   . History of cerebrovascular disease   . Kidney stone   . Depression   . Chest pain   . Pacemaker   . Pancreatitis    Past Surgical History  Procedure Laterality Date  . Pacemaker insertion      Medtronic Adapta ADDro1  . Cholecystectomy    . Total knee arthroplasty    . Hernia repair    . Tonsillectomy    . Total knee arthroplasty  08/28/2011    Procedure: TOTAL KNEE ARTHROPLASTY;  Surgeon: Drucilla Schmidt, MD;  Location: [facility];  Service: Orthopedics;  Laterality: Left;  . Eus N/A 11/04/2013    Procedure: UPPER ENDOSCOPIC ULTRASOUND (EUS) LINEAR;  Surgeon: [name], MD;  Location: [facility];  Service: Endoscopy;  Laterality: N/A;  . Ercp N/A 11/10/2013    Procedure: ENDOSCOPIC RETROGRADE CHOLANGIOPANCREATOGRAPHY (ERCP);  Surgeon: Barrie Folk, MD;  Location: [facility];  Service: Endoscopy;  Laterality: N/A;  . Biliary stent placement N/A 11/10/2013    Procedure: BILIARY STENT PLACEMENT;  Surgeon: Barrie Folk, MD;  Location: [facility];  Service: Endoscopy;  Laterality: N/A;  . Ercp N/A 12/29/2013    Procedure: ENDOSCOPIC RETROGRADE CHOLANGIOPANCREATOGRAPHY (ERCP);  Surgeon: [name], MD;  Location: [facility];  Service: Endoscopy;  Laterality: N/A;   Family History  Problem Relation Age of Onset  . Diabetes Mother   . Leukemia Father    History  Substance Use Topics  . Smoking status: Former Games developer  . Smokeless tobacco: Former Neurosurgeon    Quit date: 08/13/1976  . Alcohol Use: No    Review of Systems  Constitutional: Positive for activity change and appetite change.  Eyes: Negative for pain.  Respiratory: Negative for chest tightness and shortness of breath.   Cardiovascular: Negative for chest pain and leg swelling.  Gastrointestinal: Positive for nausea and abdominal pain. Negative for vomiting and diarrhea.  Genitourinary:  Negative for flank pain.  Musculoskeletal: Negative for back pain and neck stiffness.  Skin: Positive for color change. Negative for rash.  Neurological: Positive for weakness. Negative for numbness and headaches.  Hematological: Bruises/bleeds easily.  Psychiatric/Behavioral: Negative for behavioral problems and confusion.      Allergies  Review of patient's allergies indicates no known allergies.  Home Medications   Prior to Admission medications   Medication Sig Start Date End Date Taking? Authorizing Provider  allopurinol  (ZYLOPRIM) 300 MG tablet Take 300 mg by mouth daily.    Yes Historical Provider, MD  aspirin EC 81 MG tablet Take 81 mg by mouth daily.   Yes Historical Provider, MD  atorvastatin (LIPITOR) 80 MG tablet Take 40 mg by mouth at bedtime.    Yes Historical Provider, MD  cholecalciferol (VITAMIN D) 1000 UNITS tablet Take 1,000 Units by mouth daily.   Yes Historical Provider, MD  furosemide (LASIX) 40 MG tablet Take 40 mg by mouth every morning.    Yes Historical Provider, MD  hydroxypropyl methylcellulose (ISOPTO TEARS) 2.5 % ophthalmic solution Place 1 drop into both eyes 3 (three) times daily as needed for dry eyes.   Yes Historical Provider, MD  metoCLOPramide (REGLAN) 5 MG tablet Take 5 mg by mouth 2 (two) times daily.   Yes Historical Provider, MD  metoprolol (LOPRESSOR) 50 MG tablet Take 25 mg by mouth 2 (two) times daily.   Yes Historical Provider, MD  Multiple Vitamins-Minerals (OCUVITE EXTRA) TABS Take 1 tablet by mouth 2 (two) times daily.    Yes Historical Provider, MD  pantoprazole (PROTONIX) 40 MG tablet Take 40 mg by mouth 2 (two) times daily.    Yes Historical Provider, MD  polyethylene glycol powder (GLYCOLAX/MIRALAX) powder Take 17 g by mouth daily as needed for mild constipation (for constipation).    Yes Historical Provider, MD  sertraline (ZOLOFT) 100 MG tablet Take 100 mg by mouth every morning.   Yes Historical Provider, MD  spironolactone (ALDACTONE) 25 MG tablet Take 12.5 mg by mouth every morning.   Yes Historical Provider, MD  finasteride (PROSCAR) 5 MG tablet Take 5 mg by mouth daily.     Historical Provider, MD   BP 117/72  Pulse 73  Temp(Src) 97.6 F (36.4 C) (Oral)  Resp 18  Ht 5' 8.5" (1.74 m)  Wt 173 lb 1.6 oz (78.518 kg)  BMI 25.93 kg/m2  SpO2 100% Physical Exam  Nursing note and vitals reviewed. Constitutional: He is oriented to person, place, and time. He appears well-developed and well-nourished.  HENT:  Head: Normocephalic and atraumatic.  eccymosis to  left superior orbital area and small area below right eye.   Eyes: Pupils are equal, round, and reactive to light.  Neck: Normal range of motion. Neck supple.  Cardiovascular: Normal rate, regular rhythm and normal heart sounds.   No murmur heard. Pulmonary/Chest: Effort normal and breath sounds normal.  Abdominal: Soft. He exhibits distension. He exhibits no mass. There is tenderness. There is no rebound and no guarding.  Mild diffuse tenderness.   Musculoskeletal: Normal range of motion. He exhibits no edema.  eccymosis to left anterior knee. Stable, and non-tender.  Neurological: He is alert and oriented to person, place, and time. No cranial nerve deficit.  Skin: Skin is warm and dry.  Jaundice. eccymosis.  Psychiatric: He has a normal mood and affect.    ED Course  Procedures (including critical care time) Labs Review Labs Reviewed  CBC WITH DIFFERENTIAL - Abnormal; Notable for the  following:    RBC 2.41 (*)    Hemoglobin 8.5 (*)    HCT 25.6 (*)    MCV 106.2 (*)    MCH 35.3 (*)    RDW 17.8 (*)    All other components within normal limits  COMPREHENSIVE METABOLIC PANEL - Abnormal; Notable for the following:    Sodium 135 (*)    Potassium 3.4 (*)    Chloride 95 (*)    Glucose, Bld 180 (*)    BUN 52 (*)    Creatinine, Ser 2.12 (*)    Albumin 1.9 (*)    AST 104 (*)    ALT 70 (*)    Alkaline Phosphatase 2004 (*)    Total Bilirubin 7.8 (*)    GFR calc non Af Amer 26 (*)    GFR calc Af Amer 31 (*)    All other components within normal limits  LIPASE, BLOOD - Abnormal; Notable for the following:    Lipase 702 (*)    All other components within normal limits  URINALYSIS, ROUTINE W REFLEX MICROSCOPIC - Abnormal; Notable for the following:    Color, Urine ORANGE (*)    Bilirubin Urine SMALL (*)    Urobilinogen, UA 4.0 (*)    Leukocytes, UA TRACE (*)    All other components within normal limits  URINE MICROSCOPIC-ADD ON - Abnormal; Notable for the following:    Casts  HYALINE CASTS (*)    All other components within normal limits  PROTIME-INR  APTT  AMMONIA  TROPONIN I  AMMONIA  COMPREHENSIVE METABOLIC PANEL  CBC  AMMONIA    Imaging Review Dg Chest 2 View  01/05/2014   CLINICAL DATA:  weakness  EXAM: CHEST  2 VIEW  COMPARISON:  Two-view chest 12/06/2013  FINDINGS: The heart size and mediastinal contours are within normal limits. Elevated right hemidiaphragm. Stable left chest wall cardiac pacing unit. Both lungs are clear. The visualized skeletal structures are unremarkable.  IMPRESSION: No active cardiopulmonary disease.   Electronically Signed   By: Salome HolmesHector  Cooper M.D.   On: 01/05/2014 16:38   Ct Head Wo Contrast  01/05/2014   CLINICAL DATA:  Altered mental status.  EXAM: CT HEAD WITHOUT CONTRAST  TECHNIQUE: Contiguous axial images were obtained from the base of the skull through the vertex without intravenous contrast.  COMPARISON:  CT scan of March 26, 2013.  FINDINGS: Bony calvarium appears intact. No mass effect or midline shift is noted. Ventricular size is within normal limits. There is no evidence of mass lesion, hemorrhage or acute infarction.  IMPRESSION: Normal head CT.   Electronically Signed   By: Roque LiasJames  Green M.D.   On: 01/05/2014 16:50   [place] Abdomen Complete  01/05/2014   CLINICAL DATA:  Pancreatitis with jaundice  EXAM: ULTRASOUND ABDOMEN COMPLETE  COMPARISON:  CT abdomen and pelvis December 28, 2013; ERCP December 29, 2013  FINDINGS: Gallbladder:  Surgically absent.  Common bile duct:  Diameter: There is a stable within the biliary ductal system. There does not appear to be dilatation of the common bile duct. Common bile duct measures 5 mm. There is mild intrahepatic biliary duct dilatation.  Liver:  No focal lesion identified. The liver has a somewhat inhomogeneous echotexture. The portal venous flow appears bidirectional.  IVC:  Much of the inferior vena cava is obscured by gas. Visualized portions of the inferior vena cava appear unremarkable.   Pancreas:  Pancreas is nearly completely obscured by gas.  Spleen:  Size and appearance within normal limits.  Right Kidney:  Length: 10.5 cm. Echogenicity within normal limits. No hydronephrosis visualized. There is a cyst arising from the lower pole left kidney measuring 2.0 x 2.1 x 1.8 cm. This cyst contains debris in cannot be classified as a simple cyst. There is a simple cyst arising from the lateral mid right kidney measuring 3.7 x 3.4 x 3.6 cm. This cyst is a simple cyst.  Left Kidney:  Length: 11.1 cm. Echogenicity within normal limits. No mass or hydronephrosis visualized.  Abdominal aorta:  No aneurysm visualized.  Other findings:  There is no demonstrable ascites.  IMPRESSION: Pancreas is obscured by gas. Much of the inferior vena cava is obscured by gas.  Gallbladder is absent. There is a stent in the biliary ductal system. There is mild intrahepatic biliary duct dilatation but no extrahepatic biliary duct dilatation.  The liver echotexture is somewhat inhomogeneous, probably reflecting chronic inflammatory change. There may be underlying parenchymal disease. While no focal lesion is identified in the liver, it must be cautioned that the sensitivity of ultrasound for focal liver lesions is diminished in this circumstance. Portal vein flow appears bidirectional. Visualized portal vein is patent.  There is a nonsimple cystic mass arising from the lower pole of the right kidney. This mass on recent CT head attenuation values higher than is expected with simple cyst. Given this circumstance, pre and post-contrast CT of the kidneys may be warranted. The patient reportedly has contraindications to MRI evaluation.   Electronically Signed   By: Bretta Bang M.D.   On: 01/05/2014 19:53     EKG Interpretation   Date/Time:  Wednesday January 05 2014 15:54:09 EDT Ventricular Rate:  66 PR Interval:  232 QRS Duration: 153 QT Interval:  455 QTC Calculation: 477 R Axis:   -38 Text Interpretation:   Atrial-sensed ventricular-paced rhythm No further  analysis attempted due to paced rhythm Confirmed by Rubin Payor  MD, Harrold Donath  [phone]) on 01/05/2014 4:11:01 PM      MDM   Final diagnoses:  Renal insufficiency  Acute liver failure    Patient with generalized weakness and nausea. Has been doing billing since required biliary stent. Recently had it replaced. Has had decrease in his bilirubin since the stent was replaced, however he is still doing poorly. His creatinine is increased. His LFTs are still elevated. Discussed with Dr. Dulce Sellar. Patient be admitted to internal medicine    Juliet Rude. Rubin Payor, MD 01/05/14 [phone]

## 2014-01-05 NOTE — ED Notes (Signed)
Attempted to call report; nurse will call back.

## 2014-01-05 NOTE — ED Notes (Signed)
Nausea no vomiting x 3 days

## 2014-01-05 NOTE — ED Notes (Signed)
Pt had pancreatitis in April/May; had bile duct stent placed in May; stent changed last wk due to jaundice; had bloodwork at Dr Malena Edmanutlaws office this morning; per family pt not improving; sleeping 90% of the time; no energy; minimal intake

## 2014-01-05 NOTE — H&P (Signed)
[name] [name] is an 80 y.o. male.   Chief Complaint: nausea and sleepiness HPI:  The patient is an 80 year old man who is well known to me. He had been admitted to the hospital in May 2015 because of idiopathic pancreatitis. His pancreatic enzymes were slow to improve so he had an ERCP test done with placement of a common bile duct stent. His condition initially improved then in the past 2 weeks worsened, so he had a repeat ERCP with  Stent replacement on 12/29/13. Since the procedure he has had progressive sxs of nausea, decreased food and fluid intake, and worsening sleepiness. He has nont had fever, chills, or signifcant abdominal pain. Today he was unable to walk due to profound weakness, so he presented to the ER for evaluation. He has no recall about a fall onto his face recently.  Past Medical History  Diagnosis Date  . Osteoarthritis   . Gout   . Hypertension   . History of coronary artery disease   . GERD (gastroesophageal reflux disease)   . CHF (congestive heart failure)   . Diastolic dysfunction   . History of GI bleed   . History of pacemaker     Medtronic Adapta ADDro1  . Peptic ulcer disease   . BPH (benign prostatic hypertrophy)   . 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 Hyperlipidemia   . History of cerebrovascular disease   . Kidney stone   . Depression   . Chest pain   . Pacemaker   . Pancreatitis     Medications Prior to Admission  Medication Sig Dispense Refill  . allopurinol (ZYLOPRIM) 300 MG tablet Take 300 mg by mouth daily.       Marland Kitchen aspirin EC 81 MG tablet Take 81 mg by mouth daily.      Marland Kitchen atorvastatin (LIPITOR) 80 MG tablet Take 40 mg by mouth at bedtime.       . cholecalciferol (VITAMIN D) 1000 UNITS tablet Take 1,000 Units by mouth daily.      . furosemide (LASIX) 40 MG tablet Take 40 mg by mouth every morning.       . hydroxypropyl methylcellulose (ISOPTO TEARS) 2.5 % ophthalmic solution Place 1 drop into both eyes 3 (three) times daily as  needed for dry eyes.      Marland Kitchen metoCLOPramide (REGLAN) 5 MG tablet Take 5 mg by mouth 2 (two) times daily.      . metoprolol (LOPRESSOR) 50 MG tablet Take 25 mg by mouth 2 (two) times daily.      . Multiple Vitamins-Minerals (OCUVITE EXTRA) TABS Take 1 tablet by mouth 2 (two) times daily.       . pantoprazole (PROTONIX) 40 MG tablet Take 40 mg by mouth 2 (two) times daily.       . polyethylene glycol powder (GLYCOLAX/MIRALAX) powder Take 17 g by mouth daily as needed for mild constipation (for constipation).       . sertraline (ZOLOFT) 100 MG tablet Take 100 mg by mouth every morning.      Marland Kitchen spironolactone (ALDACTONE) 25 MG tablet Take 12.5 mg by mouth every morning.      . finasteride (PROSCAR) 5 MG tablet Take 5 mg by mouth daily.         ADDITIONAL HOME MEDICATIONS: none  PHYSICIANS INVOLVED IN CARE: Philip Aspen (PCP), Amedeo Plenty and Outlaw (ERCP/GI), [facility]  Past Surgical History  Procedure Laterality Date  . Pacemaker insertion  Medtronic Adapta ADDro1  . Cholecystectomy    . Total knee arthroplasty    . Hernia repair    . Tonsillectomy    . Total knee arthroplasty  08/28/2011    Procedure: TOTAL KNEE ARTHROPLASTY;  Surgeon: Magnus Sinning, MD;  Location: [facility];  Service: Orthopedics;  Laterality: Left;  . Eus N/A 11/04/2013    Procedure: UPPER ENDOSCOPIC ULTRASOUND (EUS) LINEAR;  Surgeon: Arta Silence, MD;  Location: [facility];  Service: Endoscopy;  Laterality: N/A;  . Ercp N/A 11/10/2013    Procedure: ENDOSCOPIC RETROGRADE CHOLANGIOPANCREATOGRAPHY (ERCP);  Surgeon: Missy Sabins, MD;  Location: [facility];  Service: Endoscopy;  Laterality: N/A;  . Biliary stent placement N/A 11/10/2013    Procedure: BILIARY STENT PLACEMENT;  Surgeon: Missy Sabins, MD;  Location: [facility];  Service: Endoscopy;  Laterality: N/A;  . Ercp N/A 12/29/2013    Procedure: ENDOSCOPIC RETROGRADE CHOLANGIOPANCREATOGRAPHY (ERCP);  Surgeon: Arta Silence, MD;  Location: [facility];  Service:  Endoscopy;  Laterality: N/A;    Family History  Problem Relation Age of Onset  . Diabetes Mother   . Leukemia Father      Social History:  reports that he has quit smoking. He quit smokeless tobacco use about 37 years ago. He reports that he does not drink alcohol or use illicit drugs.  Allergies: No Known Allergies   ROS: anemia, ankle swelling, arthritis, heart murmur, high blood pressure and liver disease (hepatitis/jaundice)  PHYSICAL EXAM: Blood pressure 117/72, pulse 73, temperature 97.6 F (36.4 C), temperature source Oral, resp. rate 18, height 5' 8.5" (1.74 m), weight 78.518 kg (173 lb 1.6 oz), SpO2 100.00%. In general, he is an elderly white man with moderate jaundice. HEENT was signfiant for scleral icterus, Neck was supple, chest was clear to ausculatation, Heart had a regular rate and rhythm, abdomen had normal bowel sounds, and no significant tenderness, extremities had 1+ bilateral leg edema. He was alert and fatigued, and welll oriented, with a normal affec. Skin showed some tan moles in the upper mid back  Results for orders placed during the hospital encounter of 01/05/14 (from the past 48 hour(s))  CBC WITH DIFFERENTIAL     Status: Abnormal   Collection Time    01/05/14  4:12 PM      Result Value Ref Range   WBC 5.8  4.0 - 10.5 K/uL   RBC 2.41 (*) 4.22 - 5.81 MIL/uL   Hemoglobin 8.5 (*) 13.0 - 17.0 g/dL   HCT 25.6 (*) 39.0 - 52.0 %   MCV 106.2 (*) 78.0 - 100.0 fL   MCH 35.3 (*) 26.0 - 34.0 pg   MCHC 33.2  30.0 - 36.0 g/dL   RDW 17.8 (*) 11.5 - 15.5 %   Platelets 178  150 - 400 K/uL   Neutrophils Relative % 59  43 - 77 %   Neutro Abs 3.5  1.7 - 7.7 K/uL   Lymphocytes Relative 29  12 - 46 %   Lymphs Abs 1.7  0.7 - 4.0 K/uL   Monocytes Relative 11  3 - 12 %   Monocytes Absolute 0.6  0.1 - 1.0 K/uL   Eosinophils Relative 1  0 - 5 %   Eosinophils Absolute 0.0  0.0 - 0.7 K/uL   Basophils Relative 0  0 - 1 %   Basophils Absolute 0.0  0.0 - 0.1 K/uL   COMPREHENSIVE METABOLIC PANEL     Status: Abnormal   Collection Time    01/05/14  4:12  PM      Result Value Ref Range   Sodium 135 (*) 137 - 147 mEq/L   Potassium 3.4 (*) 3.7 - 5.3 mEq/L   Chloride 95 (*) 96 - 112 mEq/L   CO2 26  19 - 32 mEq/L   Glucose, Bld 180 (*) 70 - 99 mg/dL   BUN 52 (*) 6 - 23 mg/dL   Creatinine, Ser 2.12 (*) 0.50 - 1.35 mg/dL   Calcium 9.0  8.4 - 10.5 mg/dL   Total Protein 6.2  6.0 - 8.3 g/dL   Albumin 1.9 (*) 3.5 - 5.2 g/dL   AST 104 (*) 0 - 37 U/L   ALT 70 (*) 0 - 53 U/L   Alkaline Phosphatase 2004 (*) 39 - 117 U/L   Total Bilirubin 7.8 (*) 0.3 - 1.2 mg/dL   GFR calc non Af Amer 26 (*) >90 mL/min   GFR calc Af Amer 31 (*) >90 mL/min   Comment: (NOTE)     The eGFR has been calculated using the CKD EPI equation.     This calculation has not been validated in all clinical situations.     eGFR's persistently <90 mL/min signify possible Chronic Kidney     Disease.   Anion gap 14  5 - 15  LIPASE, BLOOD     Status: Abnormal   Collection Time    01/05/14  4:12 PM      Result Value Ref Range   Lipase 702 (*) 11 - 59 U/L  PROTIME-INR     Status: None   Collection Time    01/05/14  4:12 PM      Result Value Ref Range   Prothrombin Time 14.4  11.6 - 15.2 seconds   INR 1.12  0.00 - 1.49  APTT     Status: None   Collection Time    01/05/14  4:12 PM      Result Value Ref Range   aPTT 30  24 - 37 seconds  TROPONIN I     Status: None   Collection Time    01/05/14  4:12 PM      Result Value Ref Range   Troponin I <0.30  <0.30 ng/mL   Comment:            Due to the release kinetics of cTnI,     a negative result within the first hours     of the onset of symptoms does not rule out     myocardial infarction with certainty.     If myocardial infarction is still suspected,     repeat the test at appropriate intervals.  AMMONIA     Status: None   Collection Time    01/05/14  4:15 PM      Result Value Ref Range   Ammonia 59  11 - 60 umol/L  URINALYSIS,  ROUTINE W REFLEX MICROSCOPIC     Status: Abnormal   Collection Time    01/05/14  5:31 PM      Result Value Ref Range   Color, Urine ORANGE (*) YELLOW   Comment: BIOCHEMICALS MAY BE AFFECTED BY COLOR   APPearance CLEAR  CLEAR   Specific Gravity, Urine 1.017  1.005 - 1.030   pH 5.0  5.0 - 8.0   Glucose, UA NEGATIVE  NEGATIVE mg/dL   Hgb urine dipstick NEGATIVE  NEGATIVE   Bilirubin Urine SMALL (*) NEGATIVE   Ketones, ur NEGATIVE  NEGATIVE mg/dL   Protein, ur NEGATIVE  NEGATIVE mg/dL  Urobilinogen, UA 4.0 (*) 0.0 - 1.0 mg/dL   Nitrite NEGATIVE  NEGATIVE   Leukocytes, UA TRACE (*) NEGATIVE  URINE MICROSCOPIC-ADD ON     Status: Abnormal   Collection Time    01/05/14  5:31 PM      Result Value Ref Range   Squamous Epithelial / LPF RARE  RARE   WBC, UA 0-2  <3 WBC/hpf   RBC / HPF 0-2  <3 RBC/hpf   Bacteria, UA RARE  RARE   Casts HYALINE CASTS (*) NEGATIVE   Urine-Other MUCOUS PRESENT    AMMONIA     Status: None   Collection Time    01/05/14  7:07 PM      Result Value Ref Range   Ammonia 32  11 - 60 umol/L   Dg Chest 2 View  01/05/2014   CLINICAL DATA:  weakness  EXAM: CHEST  2 VIEW  COMPARISON:  Two-view chest 12/06/2013  FINDINGS: The heart size and mediastinal contours are within normal limits. Elevated right hemidiaphragm. Stable left chest wall cardiac pacing unit. Both lungs are clear. The visualized skeletal structures are unremarkable.  IMPRESSION: No active cardiopulmonary disease.   Electronically Signed   By: Margaree Mackintosh M.D.   On: 01/05/2014 16:38   Ct Head Wo Contrast  01/05/2014   CLINICAL DATA:  Altered mental status.  EXAM: CT HEAD WITHOUT CONTRAST  TECHNIQUE: Contiguous axial images were obtained from the base of the skull through the vertex without intravenous contrast.  COMPARISON:  CT scan of March 26, 2013.  FINDINGS: Bony calvarium appears intact. No mass effect or midline shift is noted. Ventricular size is within normal limits. There is no evidence of mass  lesion, hemorrhage or acute infarction.  IMPRESSION: Normal head CT.   Electronically Signed   By: Sabino Dick M.D.   On: 01/05/2014 16:50   US Abdomen Complete  01/05/2014   CLINICAL DATA:  Pancreatitis with jaundice  EXAM: ULTRASOUND ABDOMEN COMPLETE  COMPARISON:  CT abdomen and pelvis December 28, 2013; ERCP December 29, 2013  FINDINGS: Gallbladder:  Surgically absent.  Common bile duct:  Diameter: There is a stable within the biliary ductal system. There does not appear to be dilatation of the common bile duct. Common bile duct measures 5 mm. There is mild intrahepatic biliary duct dilatation.  Liver:  No focal lesion identified. The liver has a somewhat inhomogeneous echotexture. The portal venous flow appears bidirectional.  IVC:  Much of the inferior vena cava is obscured by gas. Visualized portions of the inferior vena cava appear unremarkable.  Pancreas:  Pancreas is nearly completely obscured by gas.  Spleen:  Size and appearance within normal limits.  Right Kidney:  Length: 10.5 cm. Echogenicity within normal limits. No hydronephrosis visualized. There is a cyst arising from the lower pole left kidney measuring 2.0 x 2.1 x 1.8 cm. This cyst contains debris in cannot be classified as a simple cyst. There is a simple cyst arising from the lateral mid right kidney measuring 3.7 x 3.4 x 3.6 cm. This cyst is a simple cyst.  Left Kidney:  Length: 11.1 cm. Echogenicity within normal limits. No mass or hydronephrosis visualized.  Abdominal aorta:  No aneurysm visualized.  Other findings:  There is no demonstrable ascites.  IMPRESSION: Pancreas is obscured by gas. Much of the inferior vena cava is obscured by gas.  Gallbladder is absent. There is a stent in the biliary ductal system. There is mild intrahepatic biliary duct dilatation but no extrahepatic  biliary duct dilatation.  The liver echotexture is somewhat inhomogeneous, probably reflecting chronic inflammatory change. There may be underlying parenchymal disease.  While no focal lesion is identified in the liver, it must be cautioned that the sensitivity of ultrasound for focal liver lesions is diminished in this circumstance. Portal vein flow appears bidirectional. Visualized portal vein is patent.  There is a nonsimple cystic mass arising from the lower pole of the right kidney. This mass on recent CT head attenuation values higher than is expected with simple cyst. Given this circumstance, pre and post-contrast CT of the kidneys may be warranted. The patient reportedly has contraindications to MRI evaluation.   Electronically Signed   By: Lowella Grip M.D.   On: 01/05/2014 19:53     Assessment/Plan #1 Gait Instability: moderately severe and we will have PT and OT evaluations. He may need shortterm SNF rehabilitation. #2 Jaundice: appears to be from obsttructive jaundice, and we will check an abdomen US   Murle Otting G 01/05/2014, 11:37 PM

## 2014-01-06 ENCOUNTER — Inpatient Hospital Stay (HOSPITAL_COMMUNITY): Payer: Medicare Other

## 2014-01-06 LAB — GLUCOSE, CAPILLARY: GLUCOSE-CAPILLARY: 108 mg/dL — AB (ref 70–99)

## 2014-01-06 LAB — CBC
HEMATOCRIT: 22.9 % — AB (ref 39.0–52.0)
Hemoglobin: 7.3 g/dL — ABNORMAL LOW (ref 13.0–17.0)
MCH: 34.8 pg — AB (ref 26.0–34.0)
MCHC: 31.9 g/dL (ref 30.0–36.0)
MCV: 109 fL — ABNORMAL HIGH (ref 78.0–100.0)
PLATELETS: 158 10*3/uL (ref 150–400)
RBC: 2.1 MIL/uL — ABNORMAL LOW (ref 4.22–5.81)
RDW: 18 % — AB (ref 11.5–15.5)
WBC: 5.6 10*3/uL (ref 4.0–10.5)

## 2014-01-06 LAB — FOLATE: Folate: >20 ng/mL

## 2014-01-06 LAB — COMPREHENSIVE METABOLIC PANEL
ALBUMIN: 1.7 g/dL — AB (ref 3.5–5.2)
ALK PHOS: 1586 U/L — AB (ref 39–117)
ALT: 57 U/L — ABNORMAL HIGH (ref 0–53)
AST: 87 U/L — AB (ref 0–37)
Anion gap: 11 (ref 5–15)
BUN: 50 mg/dL — ABNORMAL HIGH (ref 6–23)
CHLORIDE: 101 meq/L (ref 96–112)
CO2: 28 mEq/L (ref 19–32)
Calcium: 8.6 mg/dL (ref 8.4–10.5)
Creatinine, Ser: 2.12 mg/dL — ABNORMAL HIGH (ref 0.50–1.35)
GFR calc Af Amer: 31 mL/min — ABNORMAL LOW (ref >90)
GFR calc non Af Amer: 26 mL/min — ABNORMAL LOW (ref >90)
Glucose, Bld: 135 mg/dL — ABNORMAL HIGH (ref 70–99)
Potassium: 4.2 mEq/L (ref 3.7–5.3)
Sodium: 140 mEq/L (ref 137–147)
Total Bilirubin: 6.6 mg/dL — ABNORMAL HIGH (ref 0.3–1.2)
Total Protein: 5.7 g/dL — ABNORMAL LOW (ref 6.0–8.3)

## 2014-01-06 LAB — VITAMIN B12: Vitamin B-12: >2000 pg/mL — ABNORMAL HIGH (ref 211–911)

## 2014-01-06 LAB — AMMONIA: Ammonia: 36 umol/L (ref 11–60)

## 2014-01-06 LAB — IRON AND TIBC
IRON: 97 ug/dL (ref 42–135)
Saturation Ratios: 37 % (ref 20–55)
TIBC: 265 ug/dL (ref 215–435)
UIBC: 168 ug/dL (ref 125–400)

## 2014-01-06 LAB — FERRITIN: Ferritin: 1300 ng/mL — ABNORMAL HIGH (ref 22–322)

## 2014-01-06 MED ORDER — VITAMIN C 500 MG PO TABS
500.0000 mg | ORAL_TABLET | Freq: Two times a day (BID) | ORAL | Status: DC
  Administered 2014-01-06 – 2014-01-10 (×9): 500 mg via ORAL
  Filled 2014-01-06 (×10): qty 1

## 2014-01-06 MED ORDER — POLYSACCHARIDE IRON COMPLEX 150 MG PO CAPS
150.0000 mg | ORAL_CAPSULE | Freq: Two times a day (BID) | ORAL | Status: DC
  Administered 2014-01-06 – 2014-01-10 (×9): 150 mg via ORAL
  Filled 2014-01-06 (×10): qty 1

## 2014-01-06 MED ORDER — TECHNETIUM TC 99M MEBROFENIN IV KIT
6.2000 | PACK | Freq: Once | INTRAVENOUS | Status: AC | PRN
  Administered 2014-01-06: 6 via INTRAVENOUS

## 2014-01-06 NOTE — Evaluation (Signed)
Physical Therapy Evaluation Patient Details Name: [name] [name] MRN: [id number] DOB: 05-Oct-1923 Today's Date: 01/06/2014   History of Present Illness  80 yo male admitted with acute liver failure, jaundice, weakness. Hx of gout, falls, macular degeneration, pacemaker, HTN. Recent hospital admission 10/2013 for pancreatitis.   Clinical Impression  On eval, pt required Min assist for mobility-able to ambulate ~115 feet while holding onto IV pole to simulate cane. Demonstrates general weakness, decreased activity tolerance, and impaired gait and balance. May need to start using walker for ambulation-will continue to assess this. Discussed d/c plan-pt prefers to return home-states he will discuss with daughter.     Follow Up Recommendations SNF vs Home health PT;Supervision/Assistance - 24 hour (depending on if pt/family agreeable to ST rehab-pt hesistant to agree at time of eval).     Equipment Recommendations  None recommended by PT    Recommendations for Other Services OT consult     Precautions / Restrictions Precautions Precautions: Fall Restrictions Weight Bearing Restrictions: No      Mobility  Bed Mobility Overal bed mobility: Modified Independent                Transfers Overall transfer level: Needs assistance Equipment used: None Transfers: Sit to/from Stand Sit to Stand: Min guard         General transfer comment: close guard for safety.  Ambulation/Gait Ambulation/Gait assistance: Min assist Ambulation Distance (Feet): 115 Feet Assistive device:  (held onto IV pole) Gait Pattern/deviations: Step-through pattern;Decreased stride length     General Gait Details: unsteady. assist to stabilize pt and IV pole. 1 brief standing rest break needed. fatigues fairly easily.   Stairs            Wheelchair Mobility    Modified Rankin (Stroke Patients Only)       Balance Overall balance assessment: History of Falls;Needs assistance          Standing balance support: Single extremity supported;During functional activity Standing balance-Leahy Scale: Poor                               Pertinent Vitals/Pain No c/o pain    Home Living Family/patient expects to be discharged to:: Private residence Living Arrangements: Alone;Children (daughter lives next door but works)   Type of Home: Apartment (apartment over his daughter house) Home Access: Other (comment) (chair lift)       Home Equipment: Shower seat;Cane - single point;Walker - 2 wheels      Prior Function Level of Independence: Independent with assistive device(s)               Hand Dominance        Extremity/Trunk Assessment   Upper Extremity Assessment: Defer to OT evaluation           Lower Extremity Assessment: Generalized weakness      Cervical / Trunk Assessment: Normal  Communication   Communication: No difficulties  Cognition Arousal/Alertness: Awake/alert Behavior During Therapy: WFL for tasks assessed/performed Overall Cognitive Status: No family/caregiver present to determine baseline cognitive functioning                      General Comments General comments (skin integrity, edema, etc.): min assist to perform grooming at the sink    Exercises        Assessment/Plan    PT Assessment Patient needs continued PT services  PT Diagnosis Difficulty walking;Generalized weakness  PT Problem List Decreased strength;Decreased activity tolerance;Decreased balance;Decreased mobility;Decreased knowledge of use of DME  PT Treatment Interventions DME instruction;Gait training;Functional mobility training;Therapeutic activities;Therapeutic exercise;Patient/family education;Balance training   PT Goals (Current goals can be found in the Care Plan section) Acute Rehab PT Goals Patient Stated Goal: wants to walk PT Goal Formulation: With patient Time For Goal Achievement: 01/20/14 Potential to Achieve Goals:  Good    Frequency Min 3X/week   Barriers to discharge        Co-evaluation               End of Session Equipment Utilized During Treatment: Gait belt Activity Tolerance: Patient limited by fatigue Patient left: in chair;with call bell/phone within reach           Time: 1200-1226 PT Time Calculation (min): 26 min   Charges:   PT Evaluation $Initial PT Evaluation Tier I: 1 Procedure PT Treatments $Gait Training: 8-22 mins   PT G Codes:          Rebeca AlertJannie Rashawn Rayman, MPT Pager: [phone]

## 2014-01-06 NOTE — Evaluation (Signed)
Occupational Therapy Evaluation Patient Details Name: [name] [name] E Zurcher MRN: [id number] DOB: Feb 07, 1924 Today's Date: 01/06/2014    History of Present Illness The patient is an 80 year old man admitted to the hospital in May 2015 because of idiopathic pancreatitis. His pancreatic enzymes were slow to improve so he had an ERCP test done with placement of a common bile duct stent. His condition initially improved then in the past 2 weeks worsened, so he had a repeat ERCP with  Stent replacement on 12/29/13. Since the procedure he has had progressive sxs of nausea, decreased food and fluid intake, and worsening sleepiness. He was admitted for weakness and inability to walk. He has no recall about a fall onto his face recently.     Clinical Impression   Pt overall requires min assist for ADL currently and is reporting some nausea with activity. He reports also that he has had difficulty with performing his ADL at home due to fatigue. He is home alone during the day while his daughter works. Feel he would benefit from SNF at d/c to increase strength and independence for d/c back home. He states he will discuss this recommendation with his daughter. However, if pt does decide to d/c home, he will need 24/7 supervision/assist and recommend HHOT.    Follow Up Recommendations  SNF;Supervision/Assistance - 24 hour    Equipment Recommendations  None recommended by OT    Recommendations for Other Services       Precautions / Restrictions Precautions Precautions: Fall      Mobility Bed Mobility Overal bed mobility: Modified Independent                Transfers Overall transfer level: Needs assistance Equipment used: None Transfers: Sit to/from Stand Sit to Stand: Min guard         General transfer comment: close guard for safety.    Balance                                            ADL Overall ADL's : Needs assistance/impaired Eating/Feeding:  Independent;Sitting   Grooming: Wash/dry hands;Minimal assistance;Standing   Upper Body Bathing: Set up;Sitting   Lower Body Bathing: Minimal assistance;Sit to/from stand   Upper Body Dressing : Minimal assistance;Sitting   Lower Body Dressing: Minimal assistance;Sit to/from stand   Toilet Transfer: Minimal assistance;Ambulation (hold to IV pole)   Toileting- Clothing Manipulation and Hygiene: Minimal assistance;Sit to/from stand       Functional mobility during ADLs: Minimal assistance (hold to IV pole) General ADL Comments: Pt up with PT/OT for ambulation and ADL. Assisted pt to chair afterwards and pt starting to feel nauseous. He states he has been feeling this way at home also and also has had some difficulty with fatigue with activity at home also. His daughter works during the day and he doesnt  have much assist available. He is agreeable to consider SNF and will discuss with daughter. He doesnt recall any falls at home and per chart pt has had recent falls. He states he doesnt know how he got the bruise on his face.      Vision                     Perception     Praxis      Pertinent Vitals/Pain No reports of pain but does report some nausea at  end of session.     Hand Dominance     Extremity/Trunk Assessment Upper Extremity Assessment Upper Extremity Assessment: Overall WFL for tasks assessed           Communication Communication Communication: No difficulties   Cognition Arousal/Alertness: Awake/alert Behavior During Therapy: WFL for tasks assessed/performed Overall Cognitive Status: No family/caregiver present to determine baseline cognitive functioning (pt states he recognizes he has been more confused recently. He doesnt recall any falls at home and per chart has had falls. )                     General Comments       Exercises       Shoulder Instructions      Home Living Family/patient expects to be discharged to:: Private  residence Living Arrangements: Alone;Children (daughter lives next door but works)   Type of Home: Apartment (apartment over his daughter house) Home Access: Other (comment) (chair lift)           Bathroom Shower/Tub: Tub/shower unit   Bathroom Toilet: Handicapped height     Home Equipment: Shower seat;Cane - single point;Walker - 2 wheels          Prior Functioning/Environment Level of Independence: Independent with assistive device(s)             OT Diagnosis: Generalized weakness   OT Problem List: Decreased strength;Decreased knowledge of use of DME or AE   OT Treatment/Interventions: Self-care/ADL training;Patient/family education;Therapeutic activities;DME and/or AE instruction    OT Goals(Current goals can be found in the care plan section) Acute Rehab OT Goals Patient Stated Goal: wants to walk OT Goal Formulation: With patient Time For Goal Achievement: 01/20/14 Potential to Achieve Goals: Good  OT Frequency: Min 2X/week   Barriers to D/C:            Co-evaluation              End of Session Equipment Utilized During Treatment: Gait belt  Activity Tolerance: Patient tolerated treatment well;Other (comment) (some nausea at end of session) Patient left: in chair;with call bell/phone within reach   Time: 1200-1226 OT Time Calculation (min): 26 min Charges:  OT General Charges $OT Visit: 1 Procedure OT Evaluation $Initial OT Evaluation Tier I: 1 Procedure OT Treatments $Self Care/Home Management : 8-22 mins G-Codes:    Lennox Laity [phone] 01/06/2014, 12:44 PM

## 2014-01-06 NOTE — Progress Notes (Addendum)
Clinical Social Work Department CLINICAL SOCIAL WORK PLACEMENT NOTE 01/06/2014  Patient:  [name] [name],[name] [name]  Account Number:  [id number] Admit date:  01/05/2014  Clinical Social Worker:  Unk LightningHOLLY Devra Stare, LCSW  Date/time:  01/06/2014 02:00 PM  Clinical Social Work is seeking post-discharge placement for this patient at the following level of care:   SKILLED NURSING   (*CSW will update this form in Epic as items are completed)   01/06/2014  Patient/family provided with [facility] Department of Clinical Social Work's list of facilities offering this level of care within the geographic area requested by the patient (or if unable, by the patient's family).  01/06/2014  Patient/family informed of their freedom to choose among providers that offer the needed level of care, that participate in Medicare, Medicaid or managed care program needed by the patient, have an available bed and are willing to accept the patient.  01/06/2014  Patient/family informed of [facility]' ownership interest in [facility], as well as of the fact that they are under no obligation to receive care at this facility.  PASARR submitted to EDS on existing # PASARR number received on   FL2 transmitted to all facilities in geographic area requested by pt/family on  01/06/2014 FL2 transmitted to all facilities within larger geographic area on   Patient informed that his/her managed care company has contracts with or will negotiate with  certain facilities, including the following:     Patient/family informed of bed offers received:  01/07/14 [facility] Physician recommends and patient chooses bed at    [facility]  on  01/10/14 Patient to be transferred to facility by Family Patient and family notified of transfer on 01/10/14 Name of family member notified:  Lisa-dtr-in room  The following physician request were entered in  Epic:   Additional Comments:

## 2014-01-06 NOTE — Progress Notes (Signed)
OT Cancellation Note  Patient Details Name: [name] [name] MRN: [id number] DOB: [birth year]   Cancelled Treatment:    Reason Eval/Treat Not Completed: Patient at procedure or test/ unavailable. Will try back later time.   Lennox LaityStone, [name] [phone] 01/06/2014, 10:23 AM

## 2014-01-06 NOTE — Progress Notes (Signed)
Clinical Social Work Department BRIEF PSYCHOSOCIAL ASSESSMENT 01/06/2014  Patient:  [name] [name],[name] [name]     Account Number:  [id number]     Admit date:  01/05/2014  Clinical Social Worker:  GERBER,HOLLY, LCSW  Date/Time:  01/06/2014 01:55 PM  Referred by:  Physician  Date Referred:  01/06/2014 Referred for  SNF Placement   Other Referral:   Interview type:  Patient Other interview type:    PSYCHOSOCIAL DATA Living Status:  ALONE Admitted from facility:   Level of care:   Primary support name:  Lisa Primary support relationship to patient:  CHILD, ADULT Degree of support available:   Adequate    CURRENT CONCERNS Current Concerns  Post-Acute Placement   Other Concerns:    SOCIAL WORK ASSESSMENT / PLAN CSW received referral in order to complete psychosocial assessment. Per chart review, PT and OT are recommending SNF placement at DC. CSW reviewed chart and met with patient at bedside. CSW introduced myself and explained role.    Patient reports he lives home at alone but dtr lives nearby. Patient reports he is independent and completes ADLs on his own. Patient reports about 1 month ago he was in rehab at [facility]. Patient reports he would prefer to return home if possible but will discuss DC plans with dtr. CSW offered to call dtr but patient reports he wants to speak with dtr first prior to CSW calling her. CSW left SNF list and explained DC process. Patient agreeable for CSW to search at [facility] to determine if any beds were available.    CSW completed FL2 and faxed out to [place]. CSW will follow up tomorrow.   Assessment/plan status:  Psychosocial Support/Ongoing Assessment of Needs Other assessment/ plan:   Information/referral to community resources:   SNF list    PATIENT'S/FAMILY'S RESPONSE TO PLAN OF CARE: Patient alert and oriented. Patient reports that he likes living at home and wants to return home if possible. Patient states that he is  very independent and unsure if SNF is needed at this time. Patient reports he will talk with family about increasing support but agreeable for SNF if absolutely needed. CSW will continue to follow.       Holly Gerber, LCSW [phone] 

## 2014-01-06 NOTE — Progress Notes (Signed)
Subjective: Appetite and alertness improved today, continues to have mild nausea  Objective: Vital signs in last 24 hours: Temp:  [97.6 F (36.4 C)-98.2 F (36.8 C)] 97.6 F (36.4 C) (07/09 0628) Pulse Rate:  [69-78] 78 (07/09 0628) Resp:  [16-20] 20 (07/09 0628) BP: (101-131)/(53-72) 131/62 mmHg (07/09 0628) SpO2:  [100 %] 100 % (07/09 0628) Weight:  [77.1 kg (169 lb 15.6 oz)-78.518 kg (173 lb 1.6 oz)] 78.518 kg (173 lb 1.6 oz) (07/08 2026) Weight change:    Intake/Output from previous day: 07/08 0701 - 07/09 0700 In: 514.2 [I.V.:514.2] Out: 375 [Urine:375]   General appearance: alert, cooperative and no distress Resp: clear to auscultation bilaterally Cardio: regular rate and rhythm GI: soft, non-tender; bowel sounds normal; no masses,  no organomegaly Extremities: bilateral 1+ leg edema  Lab Results:  Recent Labs  01/05/14 1612 01/06/14 0517  WBC 5.8 5.6  HGB 8.5* 7.3*  HCT 25.6* 22.9*  PLT 178 158   BMET  Recent Labs  01/05/14 1612 01/06/14 0517  NA 135* 140  K 3.4* 4.2  CL 95* 101  CO2 26 28  GLUCOSE 180* 135*  BUN 52* 50*  CREATININE 2.12* 2.12*  CALCIUM 9.0 8.6   CMET CMP     Component Value Date/Time   NA 140 01/06/2014 0517   K 4.2 01/06/2014 0517   CL 101 01/06/2014 0517   CO2 28 01/06/2014 0517   GLUCOSE 135* 01/06/2014 0517   BUN 50* 01/06/2014 0517   CREATININE 2.12* 01/06/2014 0517   CALCIUM 8.6 01/06/2014 0517   PROT 5.7* 01/06/2014 0517   ALBUMIN 1.7* 01/06/2014 0517   AST 87* 01/06/2014 0517   ALT 57* 01/06/2014 0517   ALKPHOS 1586* 01/06/2014 0517   BILITOT 6.6* 01/06/2014 0517   GFRNONAA 26* 01/06/2014 0517   GFRAA 31* 01/06/2014 0517    CBG (last 3)   Recent Labs  01/06/14 0703  GLUCAP 108*    INR RESULTS:   Lab Results  Component Value Date   INR 1.12 01/05/2014   INR 1.06 01/15/2013   INR 0.96 08/14/2011     Studies/Results: Dg Chest 2 View  01/05/2014   CLINICAL DATA:  weakness  EXAM: CHEST  2 VIEW  COMPARISON:  Two-view chest  12/06/2013  FINDINGS: The heart size and mediastinal contours are within normal limits. Elevated right hemidiaphragm. Stable left chest wall cardiac pacing unit. Both lungs are clear. The visualized skeletal structures are unremarkable.  IMPRESSION: No active cardiopulmonary disease.   Electronically Signed   By: Salome HolmesHector  Cooper M.D.   On: 01/05/2014 16:38   Ct Head Wo Contrast  01/05/2014   CLINICAL DATA:  Altered mental status.  EXAM: CT HEAD WITHOUT CONTRAST  TECHNIQUE: Contiguous axial images were obtained from the base of the skull through the vertex without intravenous contrast.  COMPARISON:  CT scan of March 26, 2013.  FINDINGS: Bony calvarium appears intact. No mass effect or midline shift is noted. Ventricular size is within normal limits. There is no evidence of mass lesion, hemorrhage or acute infarction.  IMPRESSION: Normal head CT.   Electronically Signed   By: Roque LiasJames  Green M.D.   On: 01/05/2014 16:50   [place] Abdomen Complete  01/05/2014   CLINICAL DATA:  Pancreatitis with jaundice  EXAM: ULTRASOUND ABDOMEN COMPLETE  COMPARISON:  CT abdomen and pelvis December 28, 2013; ERCP December 29, 2013  FINDINGS: Gallbladder:  Surgically absent.  Common bile duct:  Diameter: There is a stable within the biliary ductal system. There  does not appear to be dilatation of the common bile duct. Common bile duct measures 5 mm. There is mild intrahepatic biliary duct dilatation.  Liver:  No focal lesion identified. The liver has a somewhat inhomogeneous echotexture. The portal venous flow appears bidirectional.  IVC:  Much of the inferior vena cava is obscured by gas. Visualized portions of the inferior vena cava appear unremarkable.  Pancreas:  Pancreas is nearly completely obscured by gas.  Spleen:  Size and appearance within normal limits.  Right Kidney:  Length: 10.5 cm. Echogenicity within normal limits. No hydronephrosis visualized. There is a cyst arising from the lower pole left kidney measuring 2.0 x 2.1 x 1.8 cm.  This cyst contains debris in cannot be classified as a simple cyst. There is a simple cyst arising from the lateral mid right kidney measuring 3.7 x 3.4 x 3.6 cm. This cyst is a simple cyst.  Left Kidney:  Length: 11.1 cm. Echogenicity within normal limits. No mass or hydronephrosis visualized.  Abdominal aorta:  No aneurysm visualized.  Other findings:  There is no demonstrable ascites.  IMPRESSION: Pancreas is obscured by gas. Much of the inferior vena cava is obscured by gas.  Gallbladder is absent. There is a stent in the biliary ductal system. There is mild intrahepatic biliary duct dilatation but no extrahepatic biliary duct dilatation.  The liver echotexture is somewhat inhomogeneous, probably reflecting chronic inflammatory change. There may be underlying parenchymal disease. While no focal lesion is identified in the liver, it must be cautioned that the sensitivity of ultrasound for focal liver lesions is diminished in this circumstance. Portal vein flow appears bidirectional. Visualized portal vein is patent.  There is a nonsimple cystic mass arising from the lower pole of the right kidney. This mass on recent CT head attenuation values higher than is expected with simple cyst. Given this circumstance, pre and post-contrast CT of the kidneys may be warranted. The patient reportedly has contraindications to MRI evaluation.   Electronically Signed   By: Bretta Bang M.D.   On: 01/05/2014 19:53    Medications: I have reviewed the patient's current medications.  Assessment/Plan: #1 Jaundice: could be due to biliary stent obstruction, so will request nuclear med hepatobiliary study to assess stent patency. If stent is not patent then he will need to have a percutaneous procedure to decompress the hepatobiliary tree. #2 Anemia: interval worsening with hydration. Will check anemia labs, start iron, and monitor CBCs closely as he may need transfusion of PRBCs   LOS: 1 day   Jaydan Chretien  G 01/06/2014, 7:54 AM

## 2014-01-07 LAB — CBC
HCT: 23.3 % — ABNORMAL LOW (ref 39.0–52.0)
Hemoglobin: 7.2 g/dL — ABNORMAL LOW (ref 13.0–17.0)
MCH: 34.1 pg — ABNORMAL HIGH (ref 26.0–34.0)
MCHC: 30.9 g/dL (ref 30.0–36.0)
MCV: 110.4 fL — ABNORMAL HIGH (ref 78.0–100.0)
PLATELETS: 167 10*3/uL (ref 150–400)
RBC: 2.11 MIL/uL — ABNORMAL LOW (ref 4.22–5.81)
RDW: 18.3 % — ABNORMAL HIGH (ref 11.5–15.5)
WBC: 6.8 10*3/uL (ref 4.0–10.5)

## 2014-01-07 LAB — COMPREHENSIVE METABOLIC PANEL
ALBUMIN: 1.8 g/dL — AB (ref 3.5–5.2)
ALT: 53 U/L (ref 0–53)
AST: 87 U/L — AB (ref 0–37)
Alkaline Phosphatase: 1655 U/L — ABNORMAL HIGH (ref 39–117)
Anion gap: 12 (ref 5–15)
BILIRUBIN TOTAL: 6.2 mg/dL — AB (ref 0.3–1.2)
BUN: 44 mg/dL — AB (ref 6–23)
CALCIUM: 8.8 mg/dL (ref 8.4–10.5)
CO2: 26 mEq/L (ref 19–32)
CREATININE: 2.07 mg/dL — AB (ref 0.50–1.35)
Chloride: 105 mEq/L (ref 96–112)
GFR calc Af Amer: 31 mL/min — ABNORMAL LOW (ref >90)
GFR calc non Af Amer: 27 mL/min — ABNORMAL LOW (ref >90)
Glucose, Bld: 109 mg/dL — ABNORMAL HIGH (ref 70–99)
Potassium: 3.8 mEq/L (ref 3.7–5.3)
Sodium: 143 mEq/L (ref 137–147)
TOTAL PROTEIN: 5.8 g/dL — AB (ref 6.0–8.3)

## 2014-01-07 LAB — RETICULOCYTES
RBC.: 2.04 MIL/uL — AB (ref 4.22–5.81)
RETIC COUNT ABSOLUTE: 63.2 10*3/uL (ref 19.0–186.0)
Retic Ct Pct: 3.1 % (ref 0.4–3.1)

## 2014-01-07 LAB — GLUCOSE, CAPILLARY: GLUCOSE-CAPILLARY: 100 mg/dL — AB (ref 70–99)

## 2014-01-07 MED ORDER — DARBEPOETIN ALFA-POLYSORBATE 40 MCG/0.4ML IJ SOLN
40.0000 ug | Freq: Once | INTRAMUSCULAR | Status: AC
  Administered 2014-01-07: 40 ug via SUBCUTANEOUS
  Filled 2014-01-07: qty 0.4

## 2014-01-07 NOTE — Progress Notes (Signed)
PT Cancellation Note  Patient Details Name: [name] [name] MRN: [id number] DOB: June 08, 1924   Cancelled Treatment:    Reason Eval/Treat Not Completed: Patient declined, no reason specified besides just getting back to bed. Will check back later today as schedule attempts.    Rebeca AlertJannie Andjela Wickes, MPT Pager: [phone]

## 2014-01-07 NOTE — Progress Notes (Addendum)
Occupational Therapy Treatment Patient Details Name: [name] [name] MRN: [id number] DOB: 05-18-1924 Today's Date: 01/07/2014    History of present illness 80 yo male admitted with acute liver failure, jaundice, weakness. Hx of gout, falls, macular degeneration, pacemaker, HTN. Recent hospital admission 10/2013 for pancreatitis.    OT comments  Pt up to the bathroom for grooming task. He used walker today to transfer into bathroom and he states he feels this was helpful. He reports he has discussed d/c plan with daughter but they have no reached a decision regarding SNF versus home with help.   Follow Up Recommendations  SNF;Supervision/Assistance - 24 hour If pt and family decide home, then recommend HHOT.   Equipment Recommendations  None recommended by OT    Recommendations for Other Services      Precautions / Restrictions Precautions Precautions: Fall       Mobility Bed Mobility Overal bed mobility: Modified Independent                Transfers Overall transfer level: Needs assistance Equipment used: Rolling walker (2 wheeled) Transfers: Sit to/from Stand Sit to Stand: Min guard         General transfer comment: close guard for safety.    Balance                                   ADL       Grooming: Oral care;Minimal assistance;Standing Grooming Details (indicate cue type and reason): min assist to turn for paper towel to dry mouth. Min guard most of task to brush teeth. a little unsteady with turning motion.                 Toilet Transfer: Tourist information centre managerW;Ambulation;Minimal assistance Toilet Transfer Details (indicate cue type and reason): at times min guard assist with walker. min assist for turns in and out of bathroom.           General ADL Comments: Pt states he didnt experience any nausea during today's session. He states he has talked with his daughter regarding d/c but they havent decided on d/c plan yet. Noted pt to still be  fatigued after tranfer into bathroom and standing for several minutes to brush his teeth. Pt recovered wtih a rest break. Pt states he already washed up this am.      Vision                     Perception     Praxis      Cognition   Behavior During Therapy: [facility] for tasks assessed/performed Overall Cognitive Status: No family/caregiver present to determine baseline cognitive functioning                       Extremity/Trunk Assessment               Exercises     Shoulder Instructions       General Comments      Pertinent Vitals/ Pain      No report of pain  Home Living                                          Prior Functioning/Environment              Frequency Min 2X/week  Progress Toward Goals  OT Goals(current goals can now be found in the care plan section)  Progress towards OT goals: Progressing toward goals     Plan Discharge plan remains appropriate    Co-evaluation                 End of Session Equipment Utilized During Treatment: Rolling walker   Activity Tolerance Patient tolerated treatment well   Patient Left in chair;with call bell/phone within reach   Nurse Communication          Time: 1610-9604 OT Time Calculation (min): 20 min  Charges: OT General Charges $OT Visit: 1 Procedure OT Treatments $Self Care/Home Management : 8-22 mins  Lennox Laity [phone] 01/07/2014, 8:50 AM

## 2014-01-07 NOTE — Progress Notes (Signed)
PT Cancellation Note  Patient Details Name: [name] [name] MRN: [id number] DOB: 1923/08/14   Cancelled Treatment:    Reason Eval/Treat Not Completed: 2nd attempt for PT tx session-pt now eating lunch. will check back another day. thanks.    Rebeca AlertJannie Shaliah Wann, MPT Pager: [phone]

## 2014-01-07 NOTE — Progress Notes (Signed)
Clinical Social Work  CSW met with patient at bedside in order to discuss DC plans. Patient reports he has spoken with dtr but unsure if he will need SNF at DC. Patient agreeable for me to talk with dtr. CSW spoke with dtr via phone who reports she has already spoken to patient and [facility] and patient will DC to SNF when medically stable. Dtr states that patient is not happy about situation but she works and is unable to provide enough support if patient were to return home. CSW spoke with SNF who is agreeable to accept when medically stable. CSW faxed updated PT/OT evaluations.  CSW will continue to follow.  [place], [place] [phone]

## 2014-01-07 NOTE — Progress Notes (Signed)
MEDICATION RELATED CONSULT NOTE - INITIAL   Pharmacy Consult for Aranesp Indication: anemia of CKD in non-dialysis patient  No Known Allergies  Patient Measurements: Height: 5' 8.5" (174 cm) Weight: 173 lb 1.6 oz (78.518 kg) IBW/kg (Calculated) : 69.55   Vital Signs: Temp: 98.1 F (36.7 C) (07/10 0700) Temp src: Oral (07/10 0700) BP: 140/78 mmHg (07/10 0700) Pulse Rate: 70 (07/10 0700) Intake/Output from previous day: 07/09 0701 - 07/10 0700 In: 1521.7 [P.O.:240; I.V.:1281.7] Out: 670 [Urine:670] Intake/Output from this shift:    Labs:  Recent Labs  01/05/14 1612 01/06/14 0517 01/07/14 0500 01/07/14 0520  WBC 5.8 5.6 6.8  --   HGB 8.5* 7.3* 7.2*  --   HCT 25.6* 22.9* 23.3*  --   PLT 178 158 167  --   APTT 30  --   --   --   CREATININE 2.12* 2.12*  --  2.07*  ALBUMIN 1.9* 1.7*  --  1.8*  PROT 6.2 5.7*  --  5.8*  AST 104* 87*  --  87*  ALT 70* 57*  --  53  ALKPHOS 2004* 1586*  --  1655*  BILITOT 7.8* 6.6*  --  6.2*   Estimated Creatinine Clearance: 24.8 ml/min (by C-G formula based on Cr of 2.07).  01/06/14: Iron 97 Ferritin 1300 TIBC 265 FeSat 37% Folate > 20 Vit B12 > 2000  Medical History: Past Medical History  Diagnosis Date  . Osteoarthritis   . Gout   . Hypertension   . History of coronary artery disease   . GERD (gastroesophageal reflux disease)   . CHF (congestive heart failure)   . Diastolic dysfunction   . History of GI bleed   . History of pacemaker     Medtronic Adapta ADDro1  . Peptic ulcer disease   . BPH (benign prostatic hypertrophy)   . 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 Hyperlipidemia   . History of cerebrovascular disease   . Kidney stone   . Depression   . Chest pain   . Pacemaker   . Pancreatitis     Medications:  Scheduled:  . allopurinol  300 mg Oral Daily  . aspirin EC  81 mg Oral Daily  . darbepoetin (ARANESP) injection - NON-DIALYSIS  40 mcg Subcutaneous Once  . enoxaparin (LOVENOX) injection   30 mg Subcutaneous Q24H  . finasteride  5 mg Oral Daily  . furosemide  40 mg Oral q morning - 10a  . iron polysaccharides  150 mg Oral BID  . lactulose  20 g Oral BID  . metoCLOPramide  5 mg Oral BID  . metoprolol  25 mg Oral BID  . multivitamin  1 tablet Oral BID  . multivitamin with minerals  1 tablet Oral Daily  . pantoprazole  40 mg Oral BID  . pneumococcal 23 valent vaccine  0.5 mL Intramuscular Tomorrow-1000  . sertraline  100 mg Oral q morning - 10a  . sodium chloride  3 mL Intravenous Q12H  . spironolactone  12.5 mg Oral q morning - 10a  . vitamin C  500 mg Oral BID   Infusions:  . 0.9 % NaCl with KCl 20 mEq / L 50 mL/hr at 01/06/14 1637   PRN: sodium chloride, acetaminophen, acetaminophen, alum & mag hydroxide-simeth, bisacodyl, magnesium hydroxide, ondansetron (ZOFRAN) IV, ondansetron, oxyCODONE, polyvinyl alcohol, sodium chloride  Assessment: 80 y/o M with CKD and anemia, has orders to begin darbepoetin with pharmacy dosing assistance.   Iron WNL,  ferritin elevated.  On oral iron replacement therapy.  Goal of Therapy:   Minimize need for PRBC transfusions  Keep Hgb < 10 to minimize risk of darbepoetin-associated toxicities  Plan:  1. Aranesp 40 mcg (approximately 0.45 mcg/kg) SQ x 1 today. 2. Recommend redosing every 4 weeks with f/u of Hgb. 3. Will provide patient with Medication Guide.  Elie Goodyandy Dicky Boer, PharmD, BCPS Pager: [phone] 01/07/2014  8:16 AM

## 2014-01-08 LAB — CBC
HEMATOCRIT: 22.7 % — AB (ref 39.0–52.0)
Hemoglobin: 7.4 g/dL — ABNORMAL LOW (ref 13.0–17.0)
MCH: 35.9 pg — AB (ref 26.0–34.0)
MCHC: 32.6 g/dL (ref 30.0–36.0)
MCV: 110.2 fL — ABNORMAL HIGH (ref 78.0–100.0)
Platelets: 164 10*3/uL (ref 150–400)
RBC: 2.06 MIL/uL — ABNORMAL LOW (ref 4.22–5.81)
RDW: 18.6 % — ABNORMAL HIGH (ref 11.5–15.5)
WBC: 6.4 10*3/uL (ref 4.0–10.5)

## 2014-01-08 LAB — COMPREHENSIVE METABOLIC PANEL
ALBUMIN: 1.7 g/dL — AB (ref 3.5–5.2)
ALT: 46 U/L (ref 0–53)
AST: 76 U/L — ABNORMAL HIGH (ref 0–37)
Alkaline Phosphatase: 1464 U/L — ABNORMAL HIGH (ref 39–117)
Anion gap: 13 (ref 5–15)
BILIRUBIN TOTAL: 5.5 mg/dL — AB (ref 0.3–1.2)
BUN: 38 mg/dL — ABNORMAL HIGH (ref 6–23)
CO2: 25 meq/L (ref 19–32)
CREATININE: 2.02 mg/dL — AB (ref 0.50–1.35)
Calcium: 8.7 mg/dL (ref 8.4–10.5)
Chloride: 105 mEq/L (ref 96–112)
GFR calc Af Amer: 32 mL/min — ABNORMAL LOW (ref >90)
GFR calc non Af Amer: 28 mL/min — ABNORMAL LOW (ref >90)
Glucose, Bld: 145 mg/dL — ABNORMAL HIGH (ref 70–99)
POTASSIUM: 4.3 meq/L (ref 3.7–5.3)
Sodium: 143 mEq/L (ref 137–147)
Total Protein: 5.8 g/dL — ABNORMAL LOW (ref 6.0–8.3)

## 2014-01-08 LAB — GLUCOSE, CAPILLARY: Glucose-Capillary: 128 mg/dL — ABNORMAL HIGH (ref 70–99)

## 2014-01-08 NOTE — Progress Notes (Signed)
Subjective: Feels ok today, ate most of his breakfast.  Thinks he may need some time at [facility] to get his strength up.  Did not work with PT yesterday per note.  Objective: Vital signs in last 24 hours: Temp:  [97.9 F (36.6 C)-98 F (36.7 C)] 98 F (36.7 C) (07/11 0521) Pulse Rate:  [65-74] 74 (07/11 0912) Resp:  [18-20] 20 (07/11 0912) BP: (103-144)/(62-72) 103/62 mmHg (07/11 0912) SpO2:  [98 %-100 %] 99 % (07/11 0912) Weight change:  Last BM Date: 01/08/14  Intake/Output from previous day: 07/10 0701 - 07/11 0700 In: 925.8 [P.O.:360; I.V.:565.8] Out: 250 [Urine:250] Intake/Output this shift: Total I/O In: 659.2 [I.V.:659.2] Out: -  General appearance: alert, cooperative and no distress  Resp: clear to auscultation bilaterally  Cardio: regular rate and rhythm  GI: soft, non-tender; bowel sounds normal; no masses, no organomegaly  Extremities: bilateral 1+ leg edema  Lab Results:  Recent Labs  01/07/14 0500 01/08/14 0546  WBC 6.8 6.4  HGB 7.2* 7.4*  HCT 23.3* 22.7*  PLT 167 164   BMET  Recent Labs  01/07/14 0520 01/08/14 0546  NA 143 143  K 3.8 4.3  CL 105 105  CO2 26 25  GLUCOSE 109* 145*  BUN 44* 38*  CREATININE 2.07* 2.02*  CALCIUM 8.8 8.7    Studies/Results: Nm Hepatobiliary  01/06/2014   CLINICAL DATA:  Status post cholecystectomy with biliary stent in place  EXAM: NUCLEAR MEDICINE HEPATOBILIARY IMAGING  Views:  Anterior right upper quadrant  Radionuclide:  Technetium 78m Choletec  Dose:  6.2 mCi  Route of administration: Intravenous  COMPARISON:  None.  FINDINGS: Liver uptake of radiotracer is normal. Gallbladder is known to be absent. Radiotracer is seen extending through the biliary duct into the small bowel which indicates patency of the biliary stent/common bile duct. There is no evidence of biliary leak.  IMPRESSION: Evidence of patency of the common bile duct/biliary stent. Gallbladder known to be absent. No evidence of biliary leak.    Electronically Signed   By: Bretta Bang M.D.   On: 01/06/2014 10:48    Medications:  I have reviewed the patient's current medications. Scheduled: . allopurinol  300 mg Oral Daily  . aspirin EC  81 mg Oral Daily  . enoxaparin (LOVENOX) injection  30 mg Subcutaneous Q24H  . finasteride  5 mg Oral Daily  . furosemide  40 mg Oral q morning - 10a  . iron polysaccharides  150 mg Oral BID  . lactulose  20 g Oral BID  . metoCLOPramide  5 mg Oral BID  . metoprolol  25 mg Oral BID  . multivitamin  1 tablet Oral BID  . multivitamin with minerals  1 tablet Oral Daily  . pantoprazole  40 mg Oral BID  . pneumococcal 23 valent vaccine  0.5 mL Intramuscular Tomorrow-1000  . sertraline  100 mg Oral q morning - 10a  . sodium chloride  3 mL Intravenous Q12H  . spironolactone  12.5 mg Oral q morning - 10a  . vitamin C  500 mg Oral BID   Continuous: . 0.9 % NaCl with KCl 20 mEq / L 50 mL/hr at 01/07/14 2148   ONG:EXBMWU chloride, acetaminophen, acetaminophen, alum & mag hydroxide-simeth, bisacodyl, magnesium hydroxide, ondansetron (ZOFRAN) IV, ondansetron, oxyCODONE, polyvinyl alcohol, sodium chloride  Assessment/Plan: #1 Jaundice:Stent patent so no intervention needed #2 Anemia: interval worsening with hydration. Will check anemia labs, start iron, and monitor CBCs closely, stable overnight. #3 Continue refeeding, must work with  PT today,   [facility].   LOS: 3 days   Tamiya Colello W 01/08/2014, 9:54 AM

## 2014-01-09 LAB — COMPREHENSIVE METABOLIC PANEL
ALK PHOS: 1357 U/L — AB (ref 39–117)
ALT: 40 U/L (ref 0–53)
ANION GAP: 12 (ref 5–15)
AST: 71 U/L — ABNORMAL HIGH (ref 0–37)
Albumin: 1.7 g/dL — ABNORMAL LOW (ref 3.5–5.2)
BILIRUBIN TOTAL: 5 mg/dL — AB (ref 0.3–1.2)
BUN: 34 mg/dL — AB (ref 6–23)
CHLORIDE: 107 meq/L (ref 96–112)
CO2: 25 mEq/L (ref 19–32)
CREATININE: 2.04 mg/dL — AB (ref 0.50–1.35)
Calcium: 8.8 mg/dL (ref 8.4–10.5)
GFR calc non Af Amer: 28 mL/min — ABNORMAL LOW (ref >90)
GFR, EST AFRICAN AMERICAN: 32 mL/min — AB (ref >90)
GLUCOSE: 112 mg/dL — AB (ref 70–99)
POTASSIUM: 4 meq/L (ref 3.7–5.3)
Sodium: 144 mEq/L (ref 137–147)
Total Protein: 5.7 g/dL — ABNORMAL LOW (ref 6.0–8.3)

## 2014-01-09 LAB — GLUCOSE, CAPILLARY: GLUCOSE-CAPILLARY: 104 mg/dL — AB (ref 70–99)

## 2014-01-09 NOTE — Progress Notes (Signed)
Subjective: No events overnight, finished breakfast, napping a bit this AM  Objective: Vital signs in last 24 hours: Temp:  [98.1 F (36.7 C)-98.5 F (36.9 C)] 98.5 F (36.9 C) (07/12 [id number]) Pulse Rate:  [66-69] 68 (07/12 0923) Resp:  [18] 18 (07/12 0632) BP: (119-133)/(49-70) 129/49 mmHg (07/12 0923) SpO2:  [94 %-100 %] 100 % (07/12 0923) Weight change:  Last BM Date: 01/08/14  Intake/Output from previous day: 07/11 0701 - 07/12 0700 In: 2145 [P.O.:720; I.V.:1425] Out: 300 [Urine:300] Intake/Output this shift: Total I/O In: 120 [P.O.:120] Out: 300 [Urine:300]   General appearance: alert, cooperative and no distress  Resp: clear to auscultation bilaterally  Cardio: regular rate and rhythm  GI: soft, non-tender; bowel sounds normal; no masses, no organomegaly  Extremities: bilateral 1+ leg edema   Lab Results:  Recent Labs  01/07/14 0500 01/08/14 0546  WBC 6.8 6.4  HGB 7.2* 7.4*  HCT 23.3* 22.7*  PLT 167 164   BMET  Recent Labs  01/08/14 0546 01/09/14 0601  NA 143 144  K 4.3 4.0  CL 105 107  CO2 25 25  GLUCOSE 145* 112*  BUN 38* 34*  CREATININE 2.02* 2.04*  CALCIUM 8.7 8.8    Studies/Results: No results found.  Medications:  I have reviewed the patient's current medications. Scheduled: . allopurinol  300 mg Oral Daily  . aspirin EC  81 mg Oral Daily  . enoxaparin (LOVENOX) injection  30 mg Subcutaneous Q24H  . finasteride  5 mg Oral Daily  . furosemide  40 mg Oral q morning - 10a  . iron polysaccharides  150 mg Oral BID  . lactulose  20 g Oral BID  . metoCLOPramide  5 mg Oral BID  . metoprolol  25 mg Oral BID  . multivitamin  1 tablet Oral BID  . multivitamin with minerals  1 tablet Oral Daily  . pantoprazole  40 mg Oral BID  . pneumococcal 23 valent vaccine  0.5 mL Intramuscular Tomorrow-1000  . sertraline  100 mg Oral q morning - 10a  . sodium chloride  3 mL Intravenous Q12H  . spironolactone  12.5 mg Oral q morning - 10a  . vitamin C   500 mg Oral BID   Continuous: . 0.9 % NaCl with KCl 20 mEq / L 50 mL/hr at 01/08/14 2100   AOZ:HYQMVHPRN:sodium chloride, acetaminophen, acetaminophen, alum & mag hydroxide-simeth, bisacodyl, magnesium hydroxide, ondansetron (ZOFRAN) IV, ondansetron, oxyCODONE, polyvinyl alcohol, sodium chloride  Assessment/Plan: #1 Jaundice:Stent patent so no intervention needed  #2 Anemia: interval worsening with hydration.  Monitor CBCs closely, stable.  #3 Continue refeeding, must work with PT today,    LOS: 4 days   Marites Nath W 01/09/2014, 9:49 AM

## 2014-01-10 LAB — COMPREHENSIVE METABOLIC PANEL WITH GFR
ALT: 38 U/L (ref 0–53)
AST: 69 U/L — ABNORMAL HIGH (ref 0–37)
Albumin: 1.8 g/dL — ABNORMAL LOW (ref 3.5–5.2)
Alkaline Phosphatase: 1154 U/L — ABNORMAL HIGH (ref 39–117)
Anion gap: 13 (ref 5–15)
BUN: 32 mg/dL — ABNORMAL HIGH (ref 6–23)
CO2: 24 meq/L (ref 19–32)
Calcium: 8.7 mg/dL (ref 8.4–10.5)
Chloride: 107 meq/L (ref 96–112)
Creatinine, Ser: 1.81 mg/dL — ABNORMAL HIGH (ref 0.50–1.35)
GFR calc Af Amer: 37 mL/min — ABNORMAL LOW
GFR calc non Af Amer: 32 mL/min — ABNORMAL LOW
Glucose, Bld: 114 mg/dL — ABNORMAL HIGH (ref 70–99)
Potassium: 4.1 meq/L (ref 3.7–5.3)
Sodium: 144 meq/L (ref 137–147)
Total Bilirubin: 4.6 mg/dL — ABNORMAL HIGH (ref 0.3–1.2)
Total Protein: 5.6 g/dL — ABNORMAL LOW (ref 6.0–8.3)

## 2014-01-10 LAB — CBC
HCT: 22.8 % — ABNORMAL LOW (ref 39.0–52.0)
Hemoglobin: 7.3 g/dL — ABNORMAL LOW (ref 13.0–17.0)
MCH: 35.4 pg — ABNORMAL HIGH (ref 26.0–34.0)
MCHC: 32 g/dL (ref 30.0–36.0)
MCV: 110.7 fL — ABNORMAL HIGH (ref 78.0–100.0)
Platelets: 156 10*3/uL (ref 150–400)
RBC: 2.06 MIL/uL — ABNORMAL LOW (ref 4.22–5.81)
RDW: 18.4 % — ABNORMAL HIGH (ref 11.5–15.5)
WBC: 5.6 10*3/uL (ref 4.0–10.5)

## 2014-01-10 LAB — GLUCOSE, CAPILLARY: Glucose-Capillary: 102 mg/dL — ABNORMAL HIGH (ref 70–99)

## 2014-01-10 MED ORDER — ENSURE COMPLETE PO LIQD
237.0000 mL | Freq: Two times a day (BID) | ORAL | Status: DC
  Administered 2014-01-10: 237 mL via ORAL

## 2014-01-10 MED ORDER — POLYSACCHARIDE IRON COMPLEX 150 MG PO CAPS: 150.0000 mg | ORAL_CAPSULE | Freq: Every day | ORAL | Status: AC

## 2014-01-10 MED ORDER — ACETAMINOPHEN 325 MG PO TABS: 325.0000 mg | ORAL_TABLET | Freq: Four times a day (QID) | ORAL | Status: AC | PRN

## 2014-01-10 MED ORDER — ENSURE COMPLETE PO LIQD: 237.0000 mL | Freq: Two times a day (BID) | ORAL | Status: AC

## 2014-01-10 MED ORDER — LACTULOSE 10 GM/15ML PO SOLN: 20.0000 g | Freq: Two times a day (BID) | ORAL | Status: AC

## 2014-01-10 MED ORDER — ASCORBIC ACID 500 MG PO TABS: 500.0000 mg | ORAL_TABLET | Freq: Two times a day (BID) | ORAL | Status: AC

## 2014-01-10 NOTE — Discharge Summary (Signed)
Physician Discharge Summary  Patient ID: [name] [name] E Barkdull MRN: [id number] DOB/AGE: 12/13/1923 80 y.o.  Admit date: 01/05/2014 Discharge date: 01/10/2014   Discharge Diagnoses:  Principal Problem:   Liver failure, acute Active Problems:   Pancreatitis   Protein-calorie malnutrition, severe   Jaundice   Gait instability   CKD (chronic kidney disease) stage 3, GFR 30-59 ml/min   Nausea & vomiting   Anemia   Pacemaker-[employer]   Acute liver failure   Discharged Condition: fair  Hospital Course: The patient is an 80 year old man who is well known to me. He had been admitted to the hospital in May 2015 because of idiopathic pancreatitis. His pancreatic enzymes were slow to improve so he had an ERCP test done with placement of a common bile duct stent. His condition initially improved then in the past 2 weeks worsened, so he had a repeat ERCP with Stent replacement on 12/29/13. Since the procedure he has had progressive sxs of nausea, decreased food and fluid intake, and worsening sleepiness. He has nont had fever, chills, or signifcant abdominal pain. Today he was unable to walk due to profound weakness, so he presented to the ER for evaluation. He has no recall about a fall onto his face recently.   He was admitted to a medical bed for evaluation. He had an abdomen ultrasound study done that showed mild dilatation of the intrahepatic ducts with no common duct dilatation, and continued to show a non-simple cyst of the lower pole of the right kidney with no evidence of hydronephrosis. A nuclear medicine hepatobiliary scan showed evidence of patency of the common bile duct and no evidence of biliary leak. He was seen by consultants from nutrition, PT, and OT who felt that he had severe protein calorie malnutrition and severe physical deconditioning with gait instability. There were no complications associated with his hospitalization. On the day of discharge he felt fine other that having decreased  appetite. The process of discharge took 35 minutes.    Consults: None  Significant Diagnostic Studies:  No results found.  Labs: Lab Results  Component Value Date   WBC 5.6 01/10/2014   HGB 7.3* 01/10/2014   HCT 22.8* 01/10/2014   MCV 110.7* 01/10/2014   PLT 156 01/10/2014     Recent Labs Lab 01/10/14 0548  NA 144  K 4.1  CL 107  CO2 24  BUN 32*  CREATININE 1.81*  CALCIUM 8.7  PROT 5.6*  BILITOT 4.6*  ALKPHOS 1154*  ALT 38  AST 69*  GLUCOSE 114*       Lab Results  Component Value Date   INR 1.12 01/05/2014   INR 1.06 01/15/2013   INR 0.96 08/14/2011     No results found for this or any previous visit (from the past 240 hour(s)).    Discharge Exam: Blood pressure 126/63, pulse 66, temperature 98.4 F (36.9 C), temperature source Oral, resp. rate 18, height 5' 8.5" (1.74 m), weight 78.518 kg (173 lb 1.6 oz), SpO2 100.00%.  Physical Exam: In general, he is a mildly overweight white man with jaundice who was in no apparent distress. HEENT exam was significant for scleral icterus. Neck was without JVD or bruit, chest was clear, heart had a regular rhythm with a 2/6 SEM, abdomen had normal bowel sounds and no tenderness, and extremities had bilateral 1+ leg edema and edema of both arms. He was alert and well oriented with no focal neurologic deficits.  Disposition:  He will be discharged from the  hospital to go to a SNF for rehabilitation. His CMET test should be periodically reevaluated.   Discharge Instructions   Call MD for:    Complete by:  As directed   Call for fever, chills, difficulty breathing, abdominal pain, or other concerning symptoms     Diet - low sodium heart healthy    Complete by:  As directed      Discharge instructions    Complete by:  As directed   You will go to a rehabilitation facility to improve strength and gait and nutrition status prior to returning home. You should also have nutrition supplements at the facility such as ensure plus to help  improve your malnutrition. After you complete rehabilitation you should call Dr. Georgiann Hahn office at [phone] to schedule a followup visit.     Increase activity slowly    Complete by:  As directed             Medication List         acetaminophen 325 MG tablet  Commonly known as:  TYLENOL  Take 1 tablet (325 mg total) by mouth every 6 (six) hours as needed for mild pain (or Fever >/= 101).     allopurinol 300 MG tablet  Commonly known as:  ZYLOPRIM  Take 300 mg by mouth daily.     ascorbic acid 500 MG tablet  Commonly known as:  VITAMIN C  Take 1 tablet (500 mg total) by mouth 2 (two) times daily.     aspirin EC 81 MG tablet  Take 81 mg by mouth daily.     atorvastatin 80 MG tablet  Commonly known as:  LIPITOR  Take 40 mg by mouth at bedtime.     cholecalciferol 1000 UNITS tablet  Commonly known as:  VITAMIN D  Take 1,000 Units by mouth daily.     finasteride 5 MG tablet  Commonly known as:  PROSCAR  Take 5 mg by mouth daily.     furosemide 40 MG tablet  Commonly known as:  LASIX  Take 40 mg by mouth every morning.     hydroxypropyl methylcellulose 2.5 % ophthalmic solution  Commonly known as:  ISOPTO TEARS  Place 1 drop into both eyes 3 (three) times daily as needed for dry eyes.     iron polysaccharides 150 MG capsule  Commonly known as:  NIFEREX  Take 1 capsule (150 mg total) by mouth daily.     lactulose 10 GM/15ML solution  Commonly known as:  CHRONULAC  Take 30 mLs (20 g total) by mouth 2 (two) times daily.     metoCLOPramide 5 MG tablet  Commonly known as:  REGLAN  Take 5 mg by mouth 2 (two) times daily.     metoprolol 50 MG tablet  Commonly known as:  LOPRESSOR  Take 25 mg by mouth 2 (two) times daily.     OCUVITE EXTRA Tabs  Take 1 tablet by mouth 2 (two) times daily.     pantoprazole 40 MG tablet  Commonly known as:  PROTONIX  Take 40 mg by mouth 2 (two) times daily.     polyethylene glycol powder powder  Commonly known as:   GLYCOLAX/MIRALAX  Take 17 g by mouth daily as needed for mild constipation (for constipation).     sertraline 100 MG tablet  Commonly known as:  ZOLOFT  Take 100 mg by mouth every morning.     spironolactone 25 MG tablet  Commonly known as:  ALDACTONE  Take  12.5 mg by mouth every morning.         Signed: [name] 01/10/2014, 7:31 AM

## 2014-01-10 NOTE — Progress Notes (Signed)
Clinical Social Work  CSW spoke with [facility] who is agreeable to accept patient. SNF has received DC summary and reports that patient can be admitted at any time. CSW informed patient, dtr, and RN of DC plans. Dtr to transport patient to SNF. RN to call report. Dtr will take DC packet to SNF that has DC summary, DNR, and FL2 included. CSW is signing off but available if needed.  Bee RidgeHolly Adriana [name], [place] [phone]

## 2014-01-10 NOTE — Progress Notes (Signed)
[facility]; report called to Arcelia JewIeshia, RN at facility who stated understanding and denied questions/cocnerns; pt to go to facility via daughter who was given packet and discharge instructions; both patient and family stated understanding and denied questions/concerns

## 2014-02-14 ENCOUNTER — Other Ambulatory Visit: Payer: Self-pay | Admitting: Interventional Cardiology

## 2014-03-01 ENCOUNTER — Encounter: Payer: Medicare Other | Admitting: *Deleted

## 2014-03-01 ENCOUNTER — Telehealth: Payer: Self-pay | Admitting: Cardiology

## 2014-03-01 NOTE — Telephone Encounter (Signed)
LMOVM reminding pt to send remote transmission.   

## 2014-03-02 ENCOUNTER — Encounter: Payer: Self-pay | Admitting: Cardiology

## 2014-03-31 DEATH — deceased

## 2014-04-05 ENCOUNTER — Encounter: Payer: Self-pay | Admitting: *Deleted

## 2014-04-13 ENCOUNTER — Telehealth: Payer: Self-pay

## 2014-04-13 NOTE — Telephone Encounter (Signed)
[facility] per Ileene Hutchinsonbituary

## 2015-09-29 IMAGING — CT CT ABD-PELV W/O CM
1 of 2 series · 15 of 32 positions shown, 19 images · non-contrast
Comparison: CT PELVIS W/CM dated 05/18/2008

CLINICAL DATA: Lower abdominal pain, nausea and vomiting

EXAM:
CT ABDOMEN AND PELVIS WITHOUT CONTRAST
TECHNIQUE: Multidetector CT imaging of the abdomen and pelvis was performed
following the standard protocol without intravenous contrast.

[Series 2: abd/pel w/o · axial · non-contrast · 0.75mm/px · z∈[+1240,+1660]mm · 15 of 94 slices shown, 19 images]
[im 5/94  soft-tissue]
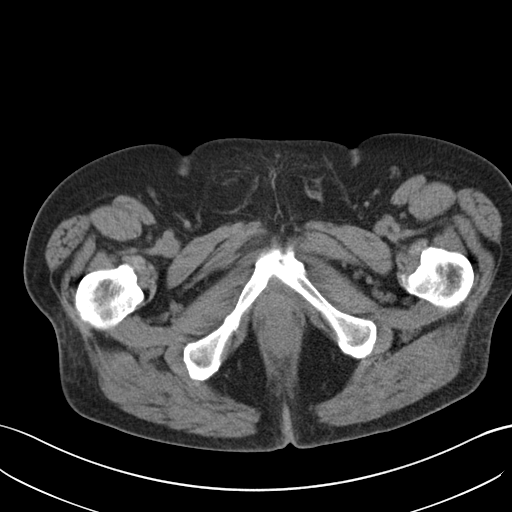
[im 5/94  bone]
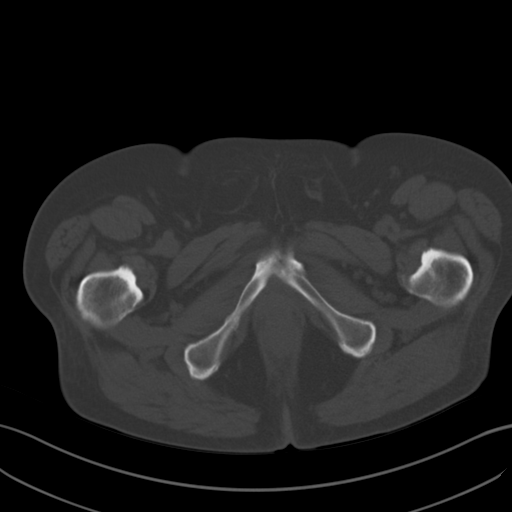
[im 13/94  soft-tissue]
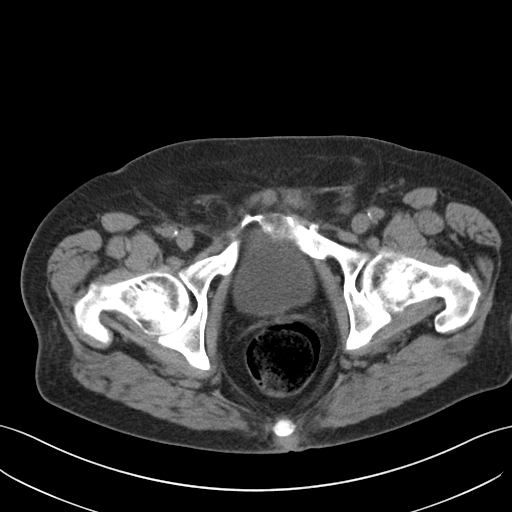
[im 21/94  soft-tissue]
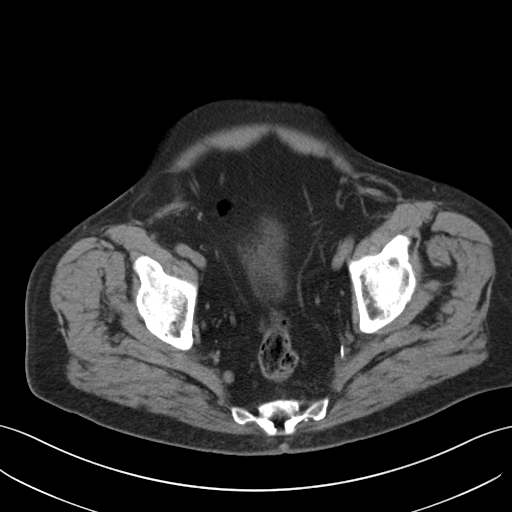
[im 25/94  soft-tissue]
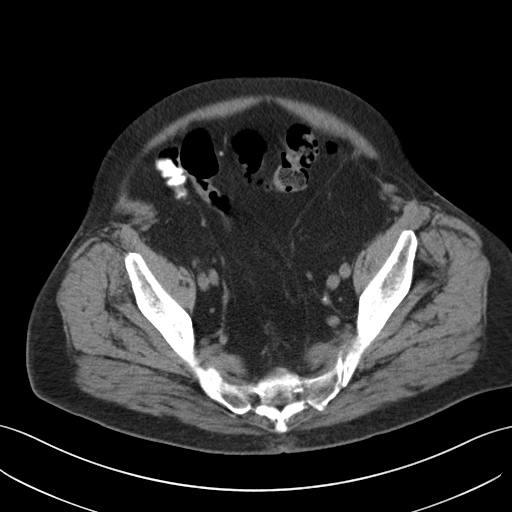
[im 33/94  soft-tissue]
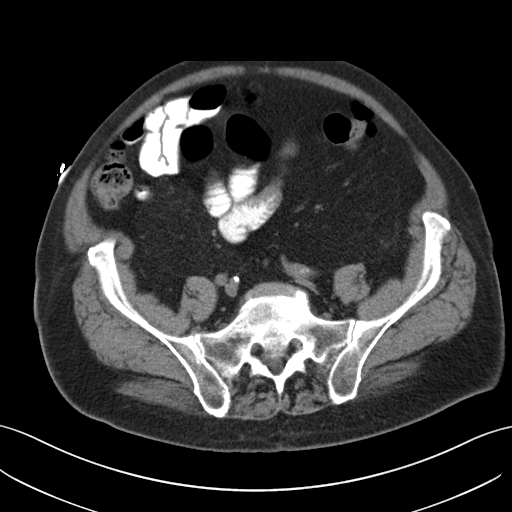
[im 41/94  soft-tissue]
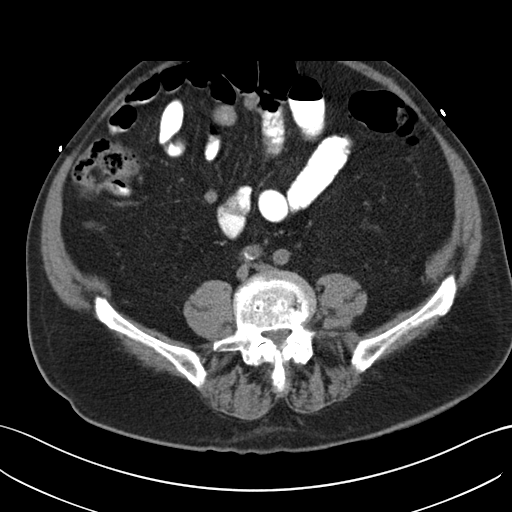
[im 49/94  soft-tissue]
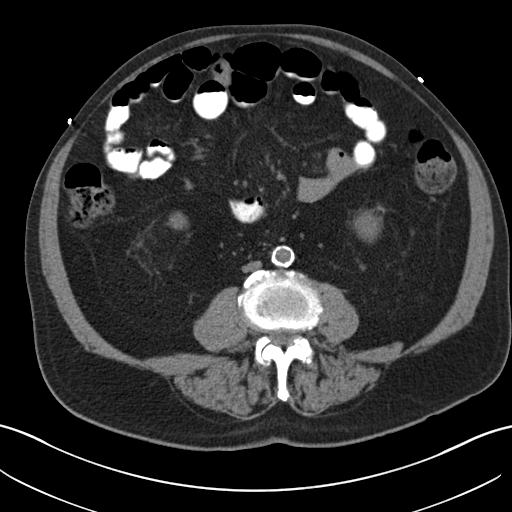
[im 53/94  soft-tissue]
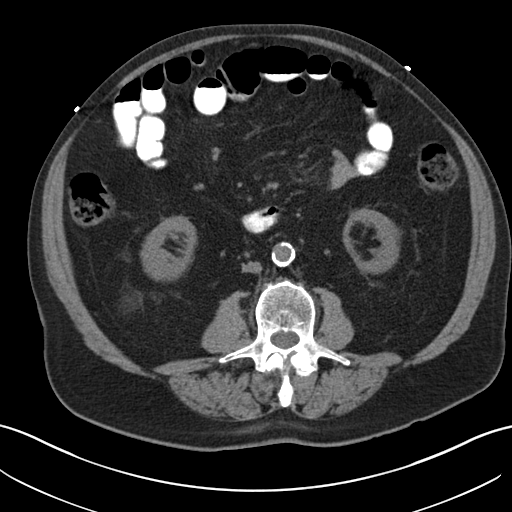
[im 61/94  soft-tissue]
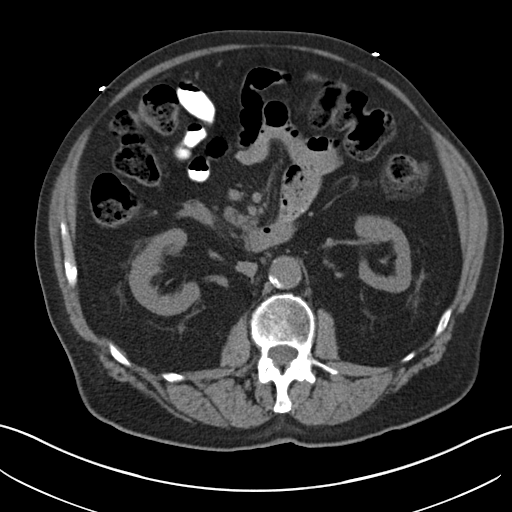
[im 61/94  bone]
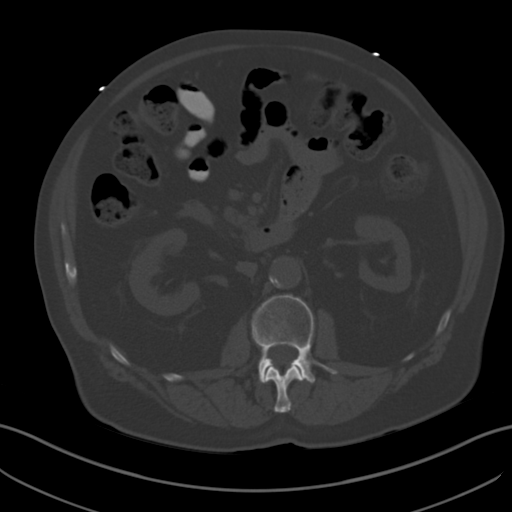
[im 69/94  soft-tissue]
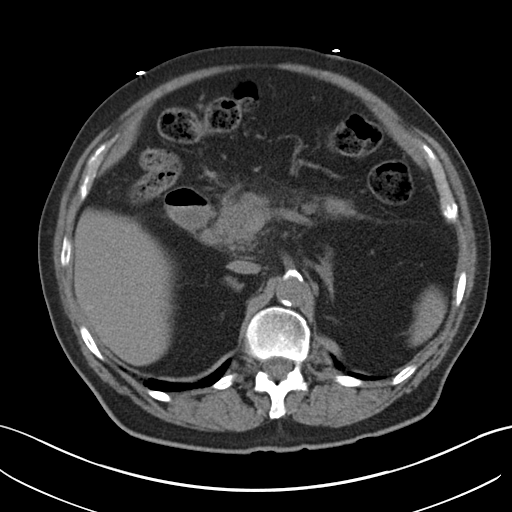
[im 73/94  soft-tissue]
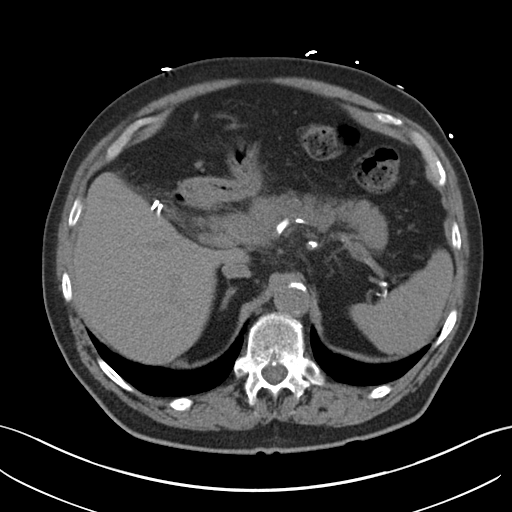
[im 77/94  lung]
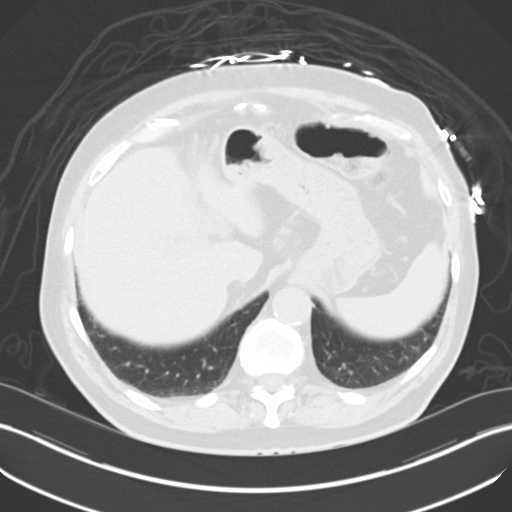
[im 81/94  soft-tissue]
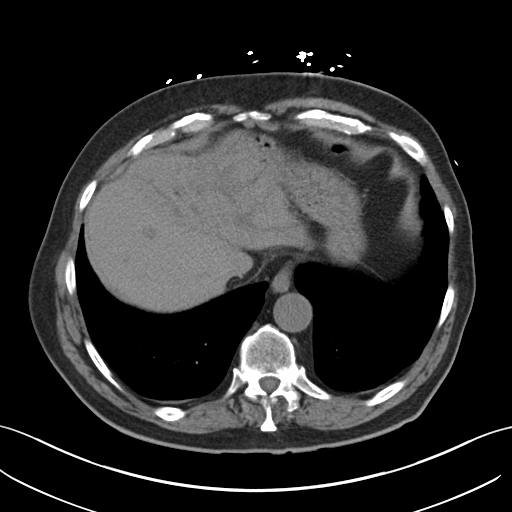
[im 81/94  lung]
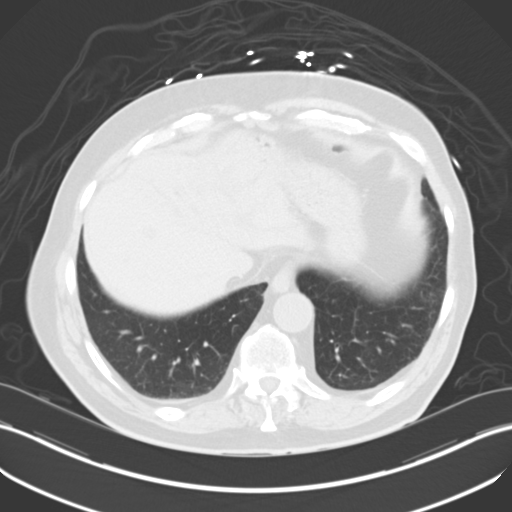
[im 85/94  lung]
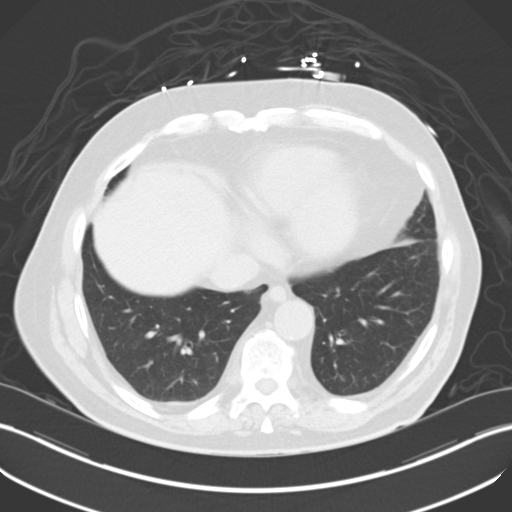
[im 89/94  soft-tissue]
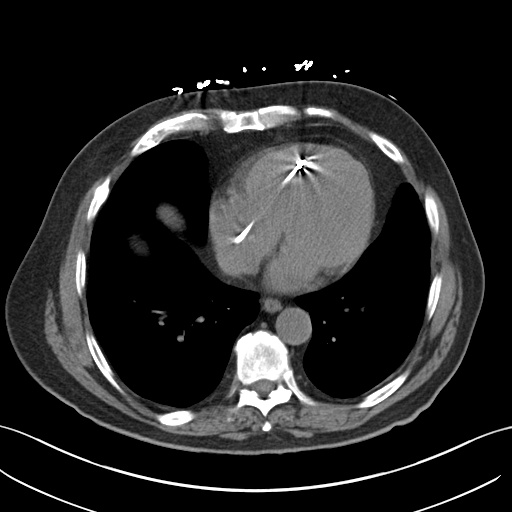
[im 89/94  lung]
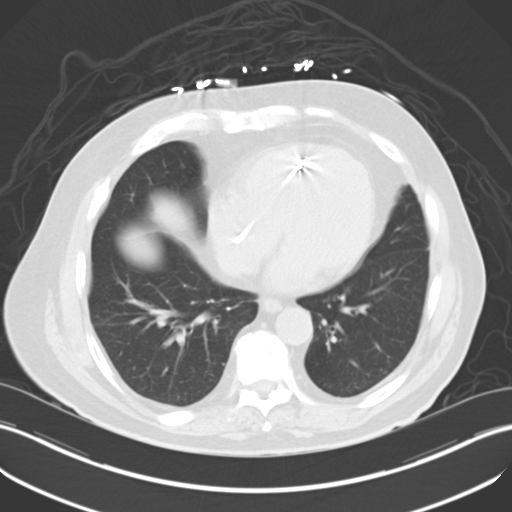

[15 of 32 positions shown; findings below may reference images not displayed]

FINDINGS: Lung bases are clear.  No pericardial fluid.

No focal hepatic lesion on this noncontrast exam. Post
cholecystectomy. There is mild stranding surrounding the head and
body of the pancreas. No organized fluid collections. Spleen,
adrenal glands, and left kidney are normal. There is a new round
intermediate density lesion extending from the lower pole of the
right kidney measuring 21 mm. Benign appearing cyst extending
laterally from the right kidney

The stomach, small bowel, appendix, cecum are normal. Colon
rectosigmoid colon are normal

Abdominal aorta is normal caliber. No retroperitoneal periportal
lymphadenopathy. No free fluid the pelvis. Bladder contains several
small diverticulum diverticula. The prostate gland with bilateral
fat filled inguinal hernias. No aggressive osseous lesion.
IMPRESSION: 1. Inflammation associated with the head and body of the pancreas is
consists with mild acute pancreatitis. No organized fluid
collections.
2. New lobular lesion extending from the lower pole right kidney is
concerning for a small renal cell carcinoma. Recommend non emergent
urology consultation and consider reduced volume IV contrast CT or
MRI renal exam for further characterization (renal insufficiency).
